# Patient Record
Sex: Female | Born: 1935 | Race: White | Hispanic: No | Marital: Married | Smoking: Never smoker
Health system: Southern US, Community
[De-identification: ages and names within clinical notes are randomized; demographics above are authoritative.]

## PROBLEM LIST (undated history)

## (undated) DIAGNOSIS — R413 Other amnesia: Principal | ICD-10-CM

## (undated) DIAGNOSIS — N814 Uterovaginal prolapse, unspecified: Secondary | ICD-10-CM

## (undated) DIAGNOSIS — R609 Edema, unspecified: Secondary | ICD-10-CM

## (undated) DIAGNOSIS — Z9181 History of falling: Secondary | ICD-10-CM

## (undated) DIAGNOSIS — K921 Melena: Secondary | ICD-10-CM

## (undated) DIAGNOSIS — E559 Vitamin D deficiency, unspecified: Secondary | ICD-10-CM

## (undated) DIAGNOSIS — K5900 Constipation, unspecified: Secondary | ICD-10-CM

## (undated) DIAGNOSIS — J301 Allergic rhinitis due to pollen: Secondary | ICD-10-CM

## (undated) DIAGNOSIS — R269 Unspecified abnormalities of gait and mobility: Secondary | ICD-10-CM

## (undated) DIAGNOSIS — B379 Candidiasis, unspecified: Secondary | ICD-10-CM

## (undated) DIAGNOSIS — R42 Dizziness and giddiness: Secondary | ICD-10-CM

## (undated) DIAGNOSIS — M1990 Unspecified osteoarthritis, unspecified site: Secondary | ICD-10-CM

## (undated) DIAGNOSIS — E785 Hyperlipidemia, unspecified: Secondary | ICD-10-CM

## (undated) DIAGNOSIS — K5732 Diverticulitis of large intestine without perforation or abscess without bleeding: Secondary | ICD-10-CM

## (undated) DIAGNOSIS — R928 Other abnormal and inconclusive findings on diagnostic imaging of breast: Secondary | ICD-10-CM

## (undated) DIAGNOSIS — M25529 Pain in unspecified elbow: Secondary | ICD-10-CM

## (undated) DIAGNOSIS — R209 Unspecified disturbances of skin sensation: Secondary | ICD-10-CM

## (undated) DIAGNOSIS — I10 Essential (primary) hypertension: Secondary | ICD-10-CM

## (undated) DIAGNOSIS — K8020 Calculus of gallbladder without cholecystitis without obstruction: Secondary | ICD-10-CM

## (undated) DIAGNOSIS — M545 Low back pain: Secondary | ICD-10-CM

## (undated) HISTORY — DX: History of falling: Z91.81

## (undated) HISTORY — DX: Hyperlipidemia, unspecified: E78.5

## (undated) HISTORY — DX: Melena: K92.1

## (undated) HISTORY — DX: Unspecified osteoarthritis, unspecified site: M19.90

## (undated) HISTORY — DX: Diverticulitis of large intestine without perforation or abscess without bleeding: K57.32

## (undated) HISTORY — DX: Other amnesia: R41.3

## (undated) HISTORY — DX: Other abnormal and inconclusive findings on diagnostic imaging of breast: R92.8

## (undated) HISTORY — DX: Uterovaginal prolapse, unspecified: N81.4

## (undated) HISTORY — DX: Candidiasis, unspecified: B37.9

## (undated) HISTORY — DX: Essential (primary) hypertension: I10

## (undated) HISTORY — DX: Vitamin D deficiency, unspecified: E55.9

## (undated) HISTORY — DX: Unspecified abnormalities of gait and mobility: R26.9

## (undated) HISTORY — DX: Pain in unspecified elbow: M25.529

## (undated) HISTORY — DX: Unspecified disturbances of skin sensation: R20.9

## (undated) HISTORY — DX: Edema, unspecified: R60.9

## (undated) HISTORY — DX: Constipation, unspecified: K59.00

## (undated) HISTORY — DX: Low back pain: M54.5

## (undated) HISTORY — DX: Dizziness and giddiness: R42

## (undated) HISTORY — DX: Calculus of gallbladder without cholecystitis without obstruction: K80.20

## (undated) HISTORY — DX: Allergic rhinitis due to pollen: J30.1

---

## 1997-04-17 HISTORY — PX: CATARACT EXTRACTION W/ INTRAOCULAR LENS  IMPLANT, BILATERAL: SHX1307

## 1997-08-24 ENCOUNTER — Encounter (HOSPITAL_COMMUNITY): Admission: RE | Admit: 1997-08-24 | Discharge: 1997-11-22 | Payer: Self-pay | Admitting: Internal Medicine

## 1999-05-03 ENCOUNTER — Encounter: Admission: RE | Admit: 1999-05-03 | Discharge: 1999-05-03 | Payer: Self-pay | Admitting: Internal Medicine

## 1999-05-03 ENCOUNTER — Encounter: Payer: Self-pay | Admitting: Internal Medicine

## 2000-05-03 ENCOUNTER — Encounter: Payer: Self-pay | Admitting: Internal Medicine

## 2000-05-03 ENCOUNTER — Encounter: Admission: RE | Admit: 2000-05-03 | Discharge: 2000-05-03 | Payer: Self-pay | Admitting: Internal Medicine

## 2001-05-22 ENCOUNTER — Encounter: Payer: Self-pay | Admitting: Internal Medicine

## 2001-05-22 ENCOUNTER — Encounter: Admission: RE | Admit: 2001-05-22 | Discharge: 2001-05-22 | Payer: Self-pay | Admitting: Internal Medicine

## 2002-07-28 ENCOUNTER — Encounter: Payer: Self-pay | Admitting: Internal Medicine

## 2002-07-28 ENCOUNTER — Encounter: Admission: RE | Admit: 2002-07-28 | Discharge: 2002-07-28 | Payer: Self-pay | Admitting: Internal Medicine

## 2003-02-24 ENCOUNTER — Encounter: Admission: RE | Admit: 2003-02-24 | Discharge: 2003-02-24 | Payer: Self-pay | Admitting: Internal Medicine

## 2003-10-28 ENCOUNTER — Inpatient Hospital Stay (HOSPITAL_COMMUNITY): Admission: EM | Admit: 2003-10-28 | Discharge: 2003-10-31 | Payer: Self-pay | Admitting: Podiatry

## 2003-10-28 DIAGNOSIS — K5732 Diverticulitis of large intestine without perforation or abscess without bleeding: Secondary | ICD-10-CM

## 2003-10-28 HISTORY — DX: Diverticulitis of large intestine without perforation or abscess without bleeding: K57.32

## 2003-11-11 ENCOUNTER — Ambulatory Visit (HOSPITAL_COMMUNITY): Admission: RE | Admit: 2003-11-11 | Discharge: 2003-11-11 | Payer: Self-pay | Admitting: Internal Medicine

## 2006-05-04 ENCOUNTER — Encounter: Admission: RE | Admit: 2006-05-04 | Discharge: 2006-05-04 | Payer: Self-pay | Admitting: Internal Medicine

## 2006-05-16 ENCOUNTER — Encounter: Admission: RE | Admit: 2006-05-16 | Discharge: 2006-05-16 | Payer: Self-pay | Admitting: Internal Medicine

## 2006-07-13 DIAGNOSIS — R42 Dizziness and giddiness: Secondary | ICD-10-CM

## 2006-07-13 DIAGNOSIS — J301 Allergic rhinitis due to pollen: Secondary | ICD-10-CM

## 2006-07-13 HISTORY — DX: Dizziness and giddiness: R42

## 2006-07-13 HISTORY — DX: Allergic rhinitis due to pollen: J30.1

## 2006-07-19 DIAGNOSIS — I10 Essential (primary) hypertension: Secondary | ICD-10-CM

## 2006-07-19 DIAGNOSIS — R609 Edema, unspecified: Secondary | ICD-10-CM

## 2006-07-19 DIAGNOSIS — M1990 Unspecified osteoarthritis, unspecified site: Secondary | ICD-10-CM

## 2006-07-19 HISTORY — DX: Edema, unspecified: R60.9

## 2006-07-19 HISTORY — DX: Unspecified osteoarthritis, unspecified site: M19.90

## 2006-07-19 HISTORY — DX: Essential (primary) hypertension: I10

## 2006-10-25 DIAGNOSIS — R928 Other abnormal and inconclusive findings on diagnostic imaging of breast: Secondary | ICD-10-CM

## 2006-10-25 DIAGNOSIS — M25529 Pain in unspecified elbow: Secondary | ICD-10-CM

## 2006-10-25 HISTORY — DX: Pain in unspecified elbow: M25.529

## 2006-10-25 HISTORY — DX: Other abnormal and inconclusive findings on diagnostic imaging of breast: R92.8

## 2006-12-24 ENCOUNTER — Encounter: Admission: RE | Admit: 2006-12-24 | Discharge: 2007-03-24 | Payer: Self-pay | Admitting: *Deleted

## 2007-03-28 DIAGNOSIS — E785 Hyperlipidemia, unspecified: Secondary | ICD-10-CM | POA: Insufficient documentation

## 2007-03-28 HISTORY — DX: Hyperlipidemia, unspecified: E78.5

## 2007-09-26 DIAGNOSIS — K5900 Constipation, unspecified: Secondary | ICD-10-CM

## 2007-09-26 HISTORY — DX: Constipation, unspecified: K59.00

## 2009-08-19 DIAGNOSIS — E559 Vitamin D deficiency, unspecified: Secondary | ICD-10-CM

## 2009-08-19 HISTORY — DX: Vitamin D deficiency, unspecified: E55.9

## 2009-11-25 DIAGNOSIS — R209 Unspecified disturbances of skin sensation: Secondary | ICD-10-CM

## 2009-11-25 HISTORY — DX: Unspecified disturbances of skin sensation: R20.9

## 2009-12-22 ENCOUNTER — Encounter: Admission: RE | Admit: 2009-12-22 | Discharge: 2009-12-22 | Payer: Self-pay | Admitting: Internal Medicine

## 2009-12-22 DIAGNOSIS — Z9181 History of falling: Secondary | ICD-10-CM

## 2009-12-22 DIAGNOSIS — M545 Low back pain, unspecified: Secondary | ICD-10-CM

## 2009-12-22 HISTORY — DX: History of falling: Z91.81

## 2009-12-22 HISTORY — DX: Low back pain, unspecified: M54.50

## 2010-02-17 DIAGNOSIS — R413 Other amnesia: Secondary | ICD-10-CM

## 2010-02-17 HISTORY — DX: Other amnesia: R41.3

## 2010-05-08 ENCOUNTER — Encounter: Payer: Self-pay | Admitting: Internal Medicine

## 2010-05-08 ENCOUNTER — Encounter: Payer: Self-pay | Admitting: *Deleted

## 2010-08-02 DIAGNOSIS — K8020 Calculus of gallbladder without cholecystitis without obstruction: Secondary | ICD-10-CM

## 2010-08-02 HISTORY — DX: Calculus of gallbladder without cholecystitis without obstruction: K80.20

## 2010-08-05 ENCOUNTER — Emergency Department (HOSPITAL_COMMUNITY): Payer: Medicare Other

## 2010-08-05 ENCOUNTER — Inpatient Hospital Stay (HOSPITAL_COMMUNITY)
Admission: EM | Admit: 2010-08-05 | Discharge: 2010-08-07 | DRG: 442 | Disposition: A | Discharge status: Home / Self Care | Payer: Medicare Other | Attending: Internal Medicine | Admitting: Internal Medicine

## 2010-08-05 DIAGNOSIS — K8020 Calculus of gallbladder without cholecystitis without obstruction: Secondary | ICD-10-CM | POA: Diagnosis present

## 2010-08-05 DIAGNOSIS — N390 Urinary tract infection, site not specified: Secondary | ICD-10-CM | POA: Diagnosis present

## 2010-08-05 DIAGNOSIS — Z66 Do not resuscitate: Secondary | ICD-10-CM | POA: Diagnosis present

## 2010-08-05 DIAGNOSIS — F068 Other specified mental disorders due to known physiological condition: Secondary | ICD-10-CM | POA: Diagnosis present

## 2010-08-05 DIAGNOSIS — M1990 Unspecified osteoarthritis, unspecified site: Secondary | ICD-10-CM | POA: Diagnosis present

## 2010-08-05 DIAGNOSIS — K5900 Constipation, unspecified: Secondary | ICD-10-CM | POA: Diagnosis present

## 2010-08-05 DIAGNOSIS — K759 Inflammatory liver disease, unspecified: Principal | ICD-10-CM | POA: Diagnosis present

## 2010-08-05 LAB — COMPREHENSIVE METABOLIC PANEL
Albumin: 3.8 g/dL (ref 3.5–5.2)
Alkaline Phosphatase: 88 U/L (ref 39–117)
BUN: 22 mg/dL (ref 6–23)
CO2: 30 mEq/L (ref 19–32)
Chloride: 102 mEq/L (ref 96–112)
GFR calc Af Amer: 54 mL/min — ABNORMAL LOW (ref >60)
Potassium: 4 mEq/L (ref 3.5–5.1)
Sodium: 139 mEq/L (ref 135–145)
Total Protein: 6.6 g/dL (ref 6.0–8.3)

## 2010-08-05 LAB — URINALYSIS, ROUTINE W REFLEX MICROSCOPIC
Hgb urine dipstick: NEGATIVE
Ketones, ur: 15 mg/dL — AB
Nitrite: NEGATIVE
Protein, ur: 30 mg/dL — AB
pH: 7.5 (ref 5.0–8.0)

## 2010-08-05 LAB — POCT CARDIAC MARKERS
CKMB, poc: <1 ng/mL — ABNORMAL LOW (ref 1.0–8.0)
Myoglobin, poc: 77.4 ng/mL (ref 12–200)
Troponin i, poc: <0.05 ng/mL (ref 0.00–0.09)

## 2010-08-05 LAB — CBC
HCT: 42.1 % (ref 36.0–46.0)
Hemoglobin: 14.3 g/dL (ref 12.0–15.0)
MCH: 31.9 pg (ref 26.0–34.0)
MCHC: 34 g/dL (ref 30.0–36.0)
MCV: 94 fL (ref 78.0–100.0)
RBC: 4.48 MIL/uL (ref 3.87–5.11)
WBC: 12.7 10*3/uL — ABNORMAL HIGH (ref 4.0–10.5)

## 2010-08-05 LAB — DIFFERENTIAL
Eosinophils Absolute: 0 10*3/uL (ref 0.0–0.7)
Monocytes Relative: 3 % (ref 3–12)
Neutro Abs: 11.7 10*3/uL — ABNORMAL HIGH (ref 1.7–7.7)
Neutrophils Relative %: 93 % — ABNORMAL HIGH (ref 43–77)

## 2010-08-05 LAB — URINE MICROSCOPIC-ADD ON

## 2010-08-06 ENCOUNTER — Inpatient Hospital Stay (HOSPITAL_COMMUNITY): Payer: Medicare Other

## 2010-08-06 DIAGNOSIS — R1013 Epigastric pain: Secondary | ICD-10-CM

## 2010-08-06 DIAGNOSIS — R932 Abnormal findings on diagnostic imaging of liver and biliary tract: Secondary | ICD-10-CM

## 2010-08-06 DIAGNOSIS — R740 Nonspecific elevation of levels of transaminase and lactic acid dehydrogenase [LDH]: Secondary | ICD-10-CM

## 2010-08-06 LAB — HEPATIC FUNCTION PANEL
AST: 420 U/L — ABNORMAL HIGH (ref 0–37)
Alkaline Phosphatase: 154 U/L — ABNORMAL HIGH (ref 39–117)
Indirect Bilirubin: 1.8 mg/dL — ABNORMAL HIGH (ref 0.3–0.9)

## 2010-08-06 LAB — COMPREHENSIVE METABOLIC PANEL
Albumin: 3.4 g/dL — ABNORMAL LOW (ref 3.5–5.2)
Calcium: 9.3 mg/dL (ref 8.4–10.5)
Chloride: 101 mEq/L (ref 96–112)
Creatinine, Ser: 1.07 mg/dL (ref 0.4–1.2)
GFR calc non Af Amer: 48 mL/min — ABNORMAL LOW (ref >60)
Glucose, Bld: 136 mg/dL — ABNORMAL HIGH (ref 70–99)
Potassium: 4.1 mEq/L (ref 3.5–5.1)

## 2010-08-06 LAB — CBC
HCT: 40.9 % (ref 36.0–46.0)
Hemoglobin: 13.7 g/dL (ref 12.0–15.0)
MCHC: 33.5 g/dL (ref 30.0–36.0)
Platelets: 164 10*3/uL (ref 150–400)

## 2010-08-06 LAB — URINE CULTURE: Culture  Setup Time: 201204201918

## 2010-08-06 LAB — MRSA PCR SCREENING: MRSA by PCR: NEGATIVE

## 2010-08-06 MED ORDER — TECHNETIUM TC 99M MEBROFENIN IV KIT
5.0000 | PACK | Freq: Once | INTRAVENOUS | Status: AC | PRN
  Administered 2010-08-06: 5 via INTRAVENOUS

## 2010-08-06 MED ORDER — GADOXETATE DISODIUM 0.25 MMOL/ML IV SOLN
7.0000 mL | Freq: Once | INTRAVENOUS | Status: AC | PRN
  Administered 2010-08-06: 7 mL via INTRAVENOUS

## 2010-08-06 NOTE — H&P (Signed)
[name], [name]                ACCOUNT NO.:  [id number]  MEDICAL RECORD NO.:  [id number]           PATIENT TYPE:  I  LOCATION:  5149                         FACILITY:  [facility]  PHYSICIAN:  Lonia Blood, M.D.       DATE OF BIRTH:  1918-09-04  DATE OF ADMISSION:  08/05/2010 DATE OF DISCHARGE:                             HISTORY & PHYSICAL   PRIMARY CARE PHYSICIAN:  Lenon Curt. Chilton Si, MD  CHIEF COMPLAINT:  Epigastric pain.  HISTORY OF PRESENT ILLNESS:  [name] is an 77 year old woman who was brought today to the emergency room after she experienced episodes of epigastric discomfort that lasted for about an hour at the assisted living.  The episode was not associated with any nausea or vomiting. She said the pain was 10/10, made her quite uncomfortable and she was transferred with ambulance to the emergency room. She carries a history of acute diverticulitis and she had an admission in July 2005 when it was noted she was having cholelithiasis and acute diverticulitis.  At that time, it was also noted that she was having elevated transaminases. The patient though since 2005 has been in excellent health, living at [facility] in the independent area.  PAST MEDICAL HISTORY:  Mainly osteoarthritis, cholelithiasis.  HOME MEDICATIONS:  Tylenol as needed.  ALLERGIES:  No drug allergies.  SOCIAL HISTORY:  The patient has 2 children, a son and daughter.  She is a widower for the past 7 years.  Does not smoke, does not drink.  Used to work in an office.  REVIEW OF SYSTEMS:  She is status post cataract surgery, still has difficulty with vision and needs to wear glasses.  Denies constipation. Denies chest pain.  Denies shortness of breath.  Otherwise per HPI were negative.  PHYSICAL EXAMINATION UPON ADMISSION:  VITAL SIGNS:  Temperature is 97.6, blood pressure 137/60, pulse 74, respirations 18, saturation 90% on room air. GENERAL:  The patient is alert, oriented, in no acute  distress. HEAD:  Normocephalic, atraumatic. EYES:  Pupils are equal, round, reactive to light and accomodation. Extraocular movements are intact. THROAT:  Clear. NECK:  Supple, no JVD. CHEST:  Clear to auscultation.  No wheezing or crackles. HEART:  Has a 2/6 systolic murmur at the apex.  No rub, no gallops. ABDOMEN:  Distended, soft, positive bowel sounds.  No tenderness.  No rebound or guarding. LOWER EXTREMITIES:  Without edema. SKIN:  Warm, dry.  No suspicious looking rashes. NEUROLOGIC:  Cranial nerves II through XII intact.  Strength 5/5 in 4 extremities.  Sensation intact.  LABORATORY VALUES ON ADMISSION:  White blood cell count 12,000, hemoglobin 14.3, platelet count 165,000.  Sodium 139, potassium 4, chloride 102, bicarbonate 30, BUN 22, creatinine 1.1.  Urinalysis positive for leukocyte esterase, many bacteria, oxalate crystals.  Chest x-ray PA and lateral, no acute findings.  Abdominal ultrasound shows cholelithiasis and some mild gallbladder wall thickening.  The patient's total bilirubin is 1.6, AST 345, ALT 159.  When compared to results from July 2005, at that time the patient had an AST of 52, ALT of 157, bilirubin 1.0, positive ANA in  the range of 1 to 640.  Speckled pattern was observed in July 2005.  ASSESSMENT/PLAN: 1. This is a 77 year old woman presenting with one episode of     epigastric discomfort.  She has resolved her symptoms by now.  The     constellation of findings of cholelithiasis with some mild     gallbladder wall thickening and elevated transaminases raise the     suspicion for chronic cholecystitis.  I doubt that she has acute     cholecystitis as she has Murphy sign negative, she is not febrile,     or acutely ill.  Nevertheless, given the fact that the patient was     quite severe, the patient should be evaluated with a HIDA scan     acutely.  I am also going to follow up on her LFTs tomorrow.  She     is not taking any alcohol and she is not  using any large quantities     of Tylenol, so I think this elevated LFTs could be explained by the     cholelithiasis.  Reviewing the records from the past, the patient     has had a HIDA scan back in July 2005 and it was noted that the     left lobe of the liver was prominent suggesting focal cirrhosis but     without any definitive diagnosis being established. 2. Urinary tract infection.  Culture will be sent.  The patient will     be started on Rocephin. 3. Part of the differential for the patient's abdominal pain symptoms     is also constipation, so we are going to obtain an abdominal x-ray     to see if that is present or not.  Per history, the patient has     good bowel movement every day. 4. I have personally addressed the code status and the patient and her     family told me that she is do not res[facility]ate [facility], so     we will continue the status.     Lonia Blood, M.D.     SL/MEDQ  D:  08/05/2010  T:  08/06/2010  Job:  [id number]  cc:   Lenon Curt Chilton Si, M.D.  Electronically Signed by Lonia Blood M.D. on 08/06/2010 03:47:10 PM

## 2010-08-07 DIAGNOSIS — R1013 Epigastric pain: Secondary | ICD-10-CM

## 2010-08-07 DIAGNOSIS — R740 Nonspecific elevation of levels of transaminase and lactic acid dehydrogenase [LDH]: Secondary | ICD-10-CM

## 2010-08-07 LAB — CBC
HCT: 41.4 % (ref 36.0–46.0)
MCV: 91.8 fL (ref 78.0–100.0)
RBC: 4.51 MIL/uL (ref 3.87–5.11)
RDW: 12.8 % (ref 11.5–15.5)
WBC: 6.1 10*3/uL (ref 4.0–10.5)

## 2010-08-07 LAB — COMPREHENSIVE METABOLIC PANEL
Alkaline Phosphatase: 176 U/L — ABNORMAL HIGH (ref 39–117)
BUN: 10 mg/dL (ref 6–23)
Chloride: 104 mEq/L (ref 96–112)
Glucose, Bld: 108 mg/dL — ABNORMAL HIGH (ref 70–99)
Potassium: 3.5 mEq/L (ref 3.5–5.1)
Total Bilirubin: 2.6 mg/dL — ABNORMAL HIGH (ref 0.3–1.2)

## 2010-08-16 NOTE — Consult Note (Signed)
[name], [name]                ACCOUNT NO.:  [id number]  MEDICAL RECORD NO.:  [id number]           PATIENT TYPE:  I  LOCATION:  5149                         FACILITY:  [facility]  PHYSICIAN:  Iva Boop, MD,FACGDATE OF BIRTH:  1918-06-18  DATE OF CONSULTATION: DATE OF DISCHARGE:                                CONSULTATION   REQUESTING PHYSICIAN:  Calvert Cantor, MD, Hospitalist.  PRIMARY CARE PHYSICIAN:  Lenon Curt. Chilton Si, MD  SURGEON:  Cherylynn Ridges, MD  REASON FOR CONSULTATION:  Abdominal pain, abnormal HIDA and ultrasound, question gallbladder problems.  ASSESSMENT:  An 77 year old white woman admitted after epigastric pain problems yesterday.  Imaging has shown mild gallbladder wall thickening in the gallbladder with gallstone, and a normal common bile duct.  She had a HIDA scan which demonstrated no filling of the gallbladder, bile duct, or intestine.  She has rising liver function tests at this point. Upon admission yesterday, bilirubin 1.6, alk phos 88, AST 345, ALT 159. Today, now bilirubin 4, alk phos 154, AST 420, ALT 442.  It is not clear to me what is going on here.  She had some mild epigastric pain after fish meal from [facility].  She is perfectly fine and nontender at this time.  History limited slightly by her short-term memory disturbance.  She does not have any gallbladder or bile duct activity on the HIDA scan and her bile duct is normal on the ultrasound. Though she could have bile duct obstruction, I am puzzled as to why we do not see any bile duct activity on the HIDA scan and the overall picture is just different enough to indicate a need for more information.  Dr. Lindie Spruce and I have discussed this, he thinks that a cholecystectomy is appropriate at some point and that certainly is likely, he thinks an preoperative ERCP would be useful.  I think that may be the case, but prior to that, I think an MRI of the liver and an MRCP are prudent to understand  this better with noninvasive minimal risk of imaging.  I have explained this to the patient and her son and daughter-in-law.  HISTORY:  Delightful 32 year old white woman who resides in Independent Living at [facility].  She is having some worsening short-term memory disturbance over time.  She ate at [facility] yesterday and later had epigastric pain.  She cannot really characterize that to well.  There was some mild nausea, no vomiting, I do not think there was any back pain.  She was sent to the emergency department where laboratory investigation showed the findings as above.  She rapidly improved and she was only in pain for an hour or so if that.  She has been admitted to the floor, she had been placed on IV ceftriaxone, have the imaging studies as described.  There was no chest pain or respiratory distress infections, no pain now on at all.  Denies any bowel habit changes or dysphagia or chronic GI problems and she takes no medications other than some Tylenol at home.  DRUG ALLERGIES:  None known.  CURRENT MEDICATIONS:  Enoxaparin prophylaxis,  ceftriaxone 1 g IV q.24 h.  PAST MEDICAL HISTORY:  Osteoarthritis and she has had known gallstones going back on ultrasound studies over the years.  Apparently, she has had some pain because she has had at least a couple of ultrasounds and even a HIDA scan in the past that was actually okay.  SOCIAL HISTORY:  She has five children altogether, one of her sons is here now.  She is widowed for 7 years.  No alcohol or tobacco.  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  PAST SURGICAL HISTORY:  Cataract surgery.  REVIEW OF SYSTEMS:  Wears eyeglasses.  Some joint pain.  All other systems are negative other than that mentioned in the HPI.  PHYSICAL EXAMINATION:  GENERAL:  Reveals a pleasant well-developed robust 77 year old white woman. VITAL SIGNS:  Temperature 98.5, pulse 68, respirations 16,  blood pressure 130/76. HEENT:  Her eyes are anicteric.  The mouth shows dentures, but otherwise free of oral lesions.  Posterior pharynx clear. NECK:  Supple.  No mass. CHEST:  Clear. HEART:  S1-S2.  I hear no rubs, murmurs, or gallops.  There is no jugular venous distention. ABDOMEN:  Soft and nontender.  It is mildly obese.  There is no organomegaly or mass. EXTREMITIES:  The lower extremities are free of edema. NEUROLOGIC:  She is grossly nonfocal on neuro exam.  She is following commands well.  Her short-term memory is not intact.  She intermittently think she is in the hospital versus [facility].  LABORATORY DATA:  As mentioned above.  White count 6.9, hemoglobin 13.7, hematocrit 40, platelet 164.  Cardiac markers are negative.  UA has a small amount of bilirubin, 21-50 white cells, large leukocytes, 15 ketones.  I have viewed the hepatobiliary scan images.  I appreciate the opportunity to care for this patient.     Iva Boop, MD,FACG     CEG/MEDQ  D:  08/06/2010  T:  08/07/2010  Job:  [id number]  cc:   Lenon Curt. Chilton Si, M.D. Cherylynn Ridges, M.D.  Electronically Signed by Stan Head MDFACG on 08/16/2010 01:11:27 PM

## 2010-08-16 NOTE — Consult Note (Signed)
  [name], [name]                ACCOUNT NO.:  [id number]  MEDICAL RECORD NO.:  [id number]           PATIENT TYPE:  LOCATION:                                 FACILITY:  PHYSICIAN:  Iva Boop, MD,FACGDATE OF BIRTH:  Sep 06, 1918  DATE OF CONSULTATION: DATE OF DISCHARGE:                                CONSULTATION   ADDENDUM  ADDITIONAL INFORMATION:  Additional records are reviewed showing that she has either sensitivities or allergies to SULFA, LOPID, and ZOCOR in the past and that she has a history of diverticulitis with partial small bowel obstruction, Zetia-induced LFT abnormalities or hepatitis, dyslipidemia, polymyalgia rheumatica, GERD, and hypertension.     Iva Boop, MD,FACG     CEG/MEDQ  D:  08/06/2010  T:  08/07/2010  Job:  [id number]  cc:   Dr. Lindie Spruce Dr. Chilton Si  Electronically Signed by Stan Head MDFACG on 08/16/2010 01:11:53 PM

## 2010-08-22 NOTE — Consult Note (Signed)
  [name], [name]                ACCOUNT NO.:  [id number]  MEDICAL RECORD NO.:  [id number]           PATIENT TYPE:  I  LOCATION:  5149                         FACILITY:  [facility]  PHYSICIAN:  Cherylynn Ridges, M.D.    DATE OF BIRTH:  1918/06/17  DATE OF CONSULTATION:  08/06/2010 DATE OF DISCHARGE:                                CONSULTATION   CHIEF COMPLAINT:  The patient is an 77 year old female with symptomatic gallstones and abnormal liver function tests.  HISTORY OF PRESENT ILLNESS:  The patient first had her episode of pain yesterday early in a day and then again after she had eaten some salmon and baked potato at [facility].  She had epigastric pain that radiated across her upper abdomen, was not associated with any nausea, vomiting, and subsequently went away with minimal problems but she did come in to be evaluated yesterday and was found to have gallstones on an ultrasound.  A HIDA scan today demonstrated nonfunctioning gallbladder.However, it turns out that her liver function tests have increased.  Her AST and ALT have increased up into 400 and her bilirubin has gone from 1.7 up to 4.0.  GI, Dr. Leone Payor has been consulted.  PAST MEDICAL HISTORY:  Very unremarkable.  She has some mild arthritis in her shoulder and she takes Tylenol for that.  MEDICATIONS:  She takes no medications outside of vitamin D prep and calcium.  ALLERGIES:  She has no known drug allergies.  PAST SURGICAL HISTORY:  She has had no previous surgery.  REVIEW OF SYSTEMS:  Unremarkable.  She has had no GI procedures performed.  PHYSICAL EXAMINATION:  VITAL SIGNS:  She is afebrile at 98.5, blood pressure 130/76, and pulse of 68. GENERAL:  She is normocephalic and atraumatic.  I do not know of any scleral icterus. NECK:  Supple.  No bruits. LUNGS:  Clear to auscultation. CARDIAC:  Regular rhythm and rate with no murmurs. ABDOMEN:  Soft and nontender.  No palpable masses.  No marks. RECTAL/PELVIC:   Not performed.  LABORATORY STUDIES:  Her white blood cell count is normal at 6.9 and hemoglobin 13.7.  LFTs have gone up.  Her total bili is 4 now and SGOT is 420 and PT is also abnormal.  Her AST and ALT have slightly improved but her bili has gone up and therefore she may benefit from a preoperative ERCP.  IMPRESSION AND PLAN:  Dr. Leone Payor is here to see her and he will determine when that study can be performed.  We will subsequently go ahead and do a cholecystectomy.     Cherylynn Ridges, M.D.     JOW/MEDQ  D:  08/06/2010  T:  08/07/2010  Job:  [id number]  cc:   Iva Boop, MD,FACG Dr. Gaynell Face  Electronically Signed by Jimmye Norman M.D. on 08/22/2010 04:10:23 PM

## 2010-08-24 NOTE — Discharge Summary (Signed)
NAMESINCLAIRE, ARTIGA                ACCOUNT NO.:  [id number]  MEDICAL RECORD NO.:  [id number]           PATIENT TYPE:  I  LOCATION:  5149                         FACILITY:  [facility]  PHYSICIAN:  Calvert Cantor, M.D.     DATE OF BIRTH:  Mar 01, 1919  DATE OF ADMISSION:  08/05/2010 DATE OF DISCHARGE:  08/07/2010                              DISCHARGE SUMMARY   PRIMARY CARE PHYSICIAN:  Lenon Curt. Chilton Si, MD  PRESENTING COMPLAINT:  Epigastric pain.  DISCHARGE DIAGNOSES: 1. Hepatitis of uncertain etiology.  The patient is to follow up with     Dr. Leone Payor as an outpatient for further workup. 2. Cholelithiasis. 3. Osteoarthritis.  DISCHARGE MEDICATIONS:  No change in medications have been made.  The patient can continue on the following discharge medications. 1. Acetaminophen 325 mg every 6 hours as needed. 2. Calcium carbonate 3 tablets daily. 3. Omega-3 ethyl esters 1 capsule daily. 4. Vicks VapoRub one application as needed at bedtime. CONSULTANTS: 1. Marta Lamas. Lindie Spruce, MD with Surgery. 2. Iva Boop, MD with GI.  HOSPITAL COURSE:  This is an 77 year old female, who presented to the ER with epigastric pain.  She is noted to have elevated LFTs and therefore further workup was performed.  An ultrasound of the abdomen was performed on the day of admission, which revealed mild gallbladder wall thickening measuring 5 mm, mobile gallstones in the fundus.  Murphy sign was negative.  Hepatobiliary scan was recommended.  HIDA scan performed on the following day was read as follows.  Absence of biliary, gallbladder, and bowel activity in 3 hours.  Hepatomegaly is also noted.  The differential included CBD obstruction versus cholestatic hepatitis.  At this point, GI consult was requested.  A Surgery consult was also requested.  She was evaluated by Dr. Leone Payor, who ordered an MRCP.  MRCP impression, cholelithiasis without evidence of cholecystitis, biliary dilatation, or  choledocholithiasis.  Of note, the cystic duct has a very low insertion on the common bile duct.  No evidence of pancreatitis or pancreas divisum.  No acute abdominal findings identified.  At this point, Dr. Marvell Fuller suspicion is the patient may have a hepatitis, possibly autoimmune.  For now, she does not need a cholecystectomy.  She is going to be discharged home and can follow up with Dr. Leone Payor for further workup.  Of note, the patient does have some dementia with problems with short- term memory.  She developed severe sundowning on the evening of August 06, 2010, and required a Comptroller.  Family is quite anxious to take her back home to her normal settings.  PHYSICAL EXAM:  ABDOMEN:  Soft, nontender, nondistended.  Bowel sounds positive.  No organomegaly. HEART:  Regular rate and rhythm.  No murmurs. LUNGS:  Clear bilaterally.  CONDITION ON DISCHARGE:  Stable.  FOLLOWUP:  Dr. Leone Payor will further contact the patient's PCP for old records.  He will then be contacting the patient/family for an outpatient appointment.  Time on discharge was 45 minutes.     Calvert Cantor, M.D.     SR/MEDQ  D:  08/07/2010  T:  08/07/2010  Job:  [id number]  cc:   Lenon Curt. Chilton Si, M.D.  Electronically Signed by Calvert Cantor M.D. on 08/24/2010 11:03:13 PM

## 2010-09-02 NOTE — Discharge Summary (Signed)
NAMEANDERSEN, MCKIVER NO.:  [id number]   MEDICAL RECORD NO.:  [id number]                   PATIENT TYPE:  INP   LOCATION:  0365                                 [facility]ACILITY[facility]  [facility]   PHYSICIAN:  Luis A. Delaney Meigs, M.D.                DATE OF BIRTH:  01/04/1919   DATE OF ADMISSION:  10/28/2003  DATE OF DISCHARGE:                                 DISCHARGE SUMMARY   ADDENDUM:  An extensive discussion was had with patient prior to discharge,  reiterating issues related to diet, use of antimicrobials, present  condition, followup, etc.  The patient's son and daughter had many  questions, and these were all answered and concerns addressed.  Nursing note  dated October 30, 2003 at 5 p.m. reads, [facility] called to report  Enterococcus in urine; however, the sample tested was in question.  The lab  is to be repeated, rerun, and results will be available in a.m., July 16th.   Patient was eager to be discharged and was requested to follow up with a  urine culture with her primary care physician, Dr. Lendell Caprice, within a week,  for which she already has an appointment scheduled.                                               Luis A. Delaney Meigs, M.D.    LAT/MEDQ  D:  10/31/2003  T:  10/31/2003  Job:  [id number]

## 2010-09-02 NOTE — H&P (Signed)
NAME:  [name], [name] NO.:  [id number]   MEDICAL RECORD NO.:  [id number]                   PATIENT TYPE:  EMS   LOCATION:  ED                                   FACILITY:  [facility]   PHYSICIAN:  Jonna L. Robb Matar, M.D.            DATE OF BIRTH:  09-Nov-1918   DATE OF ADMISSION:  10/28/2003  DATE OF DISCHARGE:                                HISTORY & PHYSICAL   PRIMARY CARE PHYSICIAN:  Janae Bridgeman. Lendell Caprice, M.D.   CHIEF COMPLAINT:  Stomach pain.   HISTORY:  This 77 year old white female developed postprandial abdominal  pain last night, sort of diffusely.  She threw up three times of some food  that she had eaten, she was kind of weak.  It did not get any better with  Maalox.  Her back was hurting.  By this morning she called her son at 6:30  in the morning and states she just was not feeling good.  In the emergency  room she received some fluids and her color is better and she has not thrown  up today.  She had no exposure to food poisoning.   PAST MEDICAL HISTORY:  1. Cholelithiasis.  2. Hypercholesterolemia.  She had been on a previous medication, but it was     stopped because she had elevated liver function tests and she was     switched to Zetia, but she has just been trying to watch her diet and try     to stay off the medication.  3. Osteoarthritis.  4. GERD.   OPERATIONS:  Bilateral cataract surgery.   FAMILY HISTORY:  Diabetes.   SOCIAL HISTORY:  Her husband died three weeks ago secondary to cancer.  She  has five children.  She is a nonsmoker, nondrinker, retired Film/video editor.   ALLERGIES:  None.   MEDICATIONS:  Aciphex and Zetia.   REVIEW OF SYSTEMS:  The patient has had a 55-pound weight loss this year,  which was planned.  No visual problems since the cataracts were taken out.  No ENT, cough, wheezing, chest pain.  No heart disease or blood clots.  No  hematemesis or melena.  Occasional constipation.  No dysuria or frequency or  hematuria.  No breast problems.  She has not been told of diabetes.  No  thyroid disease.  No unusual bleeding or bruising.  No skin problems.  She  has arthritis, especially in her back and her hands.  No history of stroke,  seizure.  She used to get bad headaches when she was younger but has not had  them in years.  No psychiatric problems.   PHYSICAL EXAMINATION:  VITAL SIGNS:  Temperature 97.4, pulse 94, 24, 109/65,  weight 139.  GENERAL:  This is an older white female lying in bed.  HEENT:  Conjunctivae and lids are normal.  Pupils are equal, and reactive to  light, EOMs are full.  She has hearing, mucosa, and pharynx.  NECK:  Supple without masses, thyromegaly, or carotid bruits.  CHEST:  The respiratory effort is normal.  The lungs are clear to A&P  without wheezing, rales, rhonchi, or dullness.  CARDIAC:  Regular rate and rhythm with normal S1 and S2 without murmurs,  rubs, or gallops.  There are no bruits, cyanosis, clubbing, or edema.  BREASTS:  No masses, tenderness, or discharge.  ABDOMEN:  The abdomen has bowel sounds.  There is no hepatosplenomegaly or  hernias.  There is some epigastric tenderness.  There is tenderness on  either side of the umbilicus over the renal area, and the most tender area  actually seems to be the left lower quadrant.  RECTAL:  Stool is negative for blood.  LYMPHATIC:  There is no adenopathy.  MUSCULOSKELETAL:  Muscle strength is 5/5 all four extremities with full  range of motion.  She has osteoarthritic changes.  SKIN:  No rashes, lesions, induration.  Subcutaneous cysts.  NEUROLOGIC:  Cranial nerves are intact.  EOMs are full.  DTRs are 2+ in the  knees.  Sensation is normal.  She is alert and oriented x3.  Normal memory,  judgment, and affect.   LABORATORY DATA:  Ultrasound shows gallstones but no inflammation or biliary  dilatation.  KUB shows partial small bowel obstruction.  White count is  17.8.  BUN 13, creatinine 1.1, glucose 197.  Her  liver function tests are  elevated with an AST of 467, ALT of 432, bilirubin of 2.5, and alkaline  phosphatase of 156.  Urinalysis is pending.   IMPRESSION:  1. Partial small bowel obstruction and infection, probably due to a     diverticulitis, although pyelonephritis would also be a consideration.  I     am going to put her on some Unasyn pending cultures, keep her just on     clear liquids, and follow her abdomen along.  2. Hepatitis, possibly drug-induced.  She sounds like she was switched off     of statins, but Zetia can also give a drug-induced hepatitis.  I am going     to discontinue that.  There was not any specific obstructive pathology     seen on the ultrasound.  If her obstruction persists, I will get a CT of     her abdomen and perhaps a surgical consultation.  3. Hypercholesterolemia.  4. Hypertension.  I am going to hold on any medications; in fact, she was     probably slightly dry from her vomiting.  5. Hyperglycemia.  I am going to pattern her sugars and cover her.                                               Jonna L. Robb Matar, M.D.    Dorna Bloom  D:  10/28/2003  T:  10/28/2003  Job:  [id number]   cc:   Janae Bridgeman. Eloise Harman., M.D.  [address]  [place]  [place] [address]  Fax: [phone]

## 2010-09-16 ENCOUNTER — Telehealth: Payer: Self-pay

## 2010-09-16 NOTE — Telephone Encounter (Signed)
I spoke with the patient and her son.  Her liver tests drawn at [facility] were normal on 08/25/10.  She denies abdominal pain or "other problems that a doctor needs to know about".  Her son reports that she is doing much better eating well and now in assisted living.  I will contact [facility] for the last lab work and have them fax them to [place].  Herr sons states she is doing 100 % better.

## 2010-09-16 NOTE — Telephone Encounter (Signed)
Message copied by Annett Fabian on Fri Sep 16, 2010  3:46 PM ------      Message from: Stan Head E      Created: Thu Sep 15, 2010  6:17 PM       Please contact her son - let him know it has been a while but I did look at her records from dr. Chilton Si and that her LFT's were normal in 07/2009            She may need some follow-up for the abnormal ones in hiospital            1) How is she ? Any abd pain      2) if no LFT's since hospital she should have them checked re: 790.4

## 2010-09-19 NOTE — Telephone Encounter (Signed)
Ok thanks Sounds like she will need no further intervention at this time.

## 2011-03-02 DIAGNOSIS — R269 Unspecified abnormalities of gait and mobility: Secondary | ICD-10-CM

## 2011-03-02 HISTORY — DX: Unspecified abnormalities of gait and mobility: R26.9

## 2011-06-29 DIAGNOSIS — N814 Uterovaginal prolapse, unspecified: Secondary | ICD-10-CM | POA: Insufficient documentation

## 2011-06-29 DIAGNOSIS — K921 Melena: Secondary | ICD-10-CM

## 2011-06-29 HISTORY — DX: Uterovaginal prolapse, unspecified: N81.4

## 2011-06-29 HISTORY — DX: Melena: K92.1

## 2012-06-11 IMAGING — US US ABDOMEN COMPLETE
1 series · 14 of 25 positions shown · non-contrast
Comparison: None.

CLINICAL DATA: Epigastric pain, elevated LFTs, question CBD
obstruction versus gallbladder disease

COMPLETE ABDOMINAL ULTRASOUND

[Series 1: us abdomen complete · 0.25mm/px · 14 of 108 slices shown]
[im 1/108]
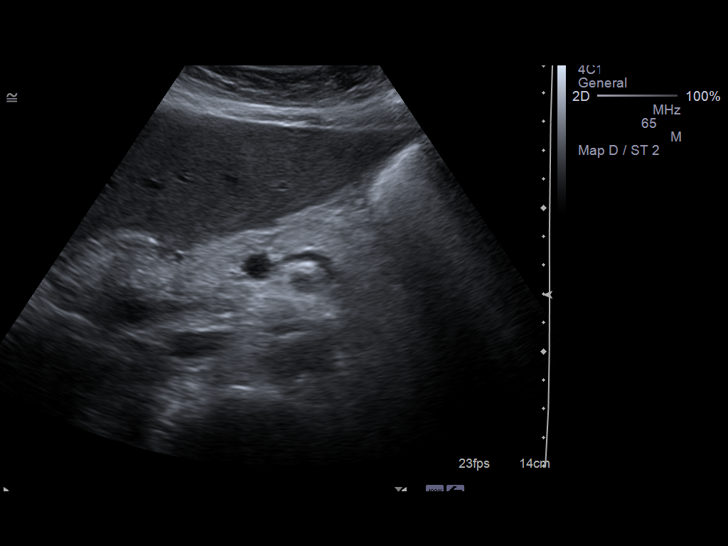
[im 9/108]
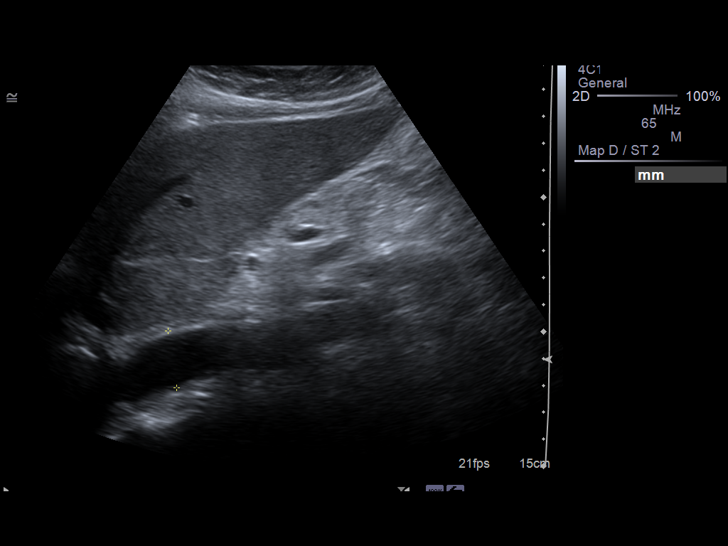
[im 18/108]
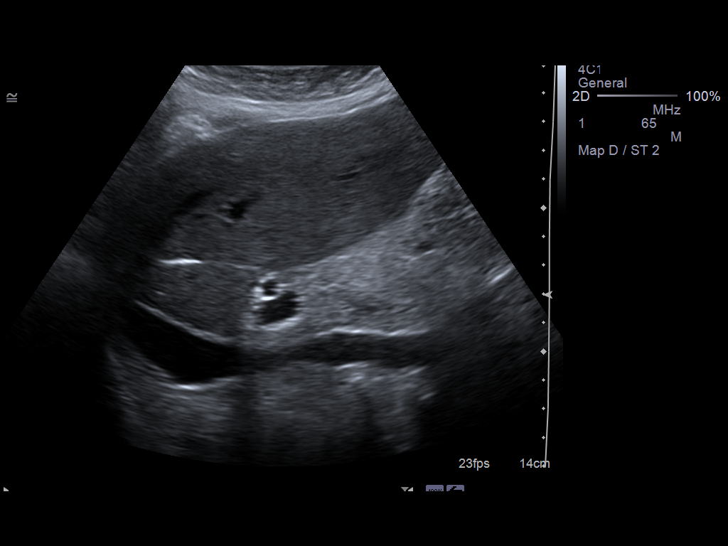
[im 27/108]
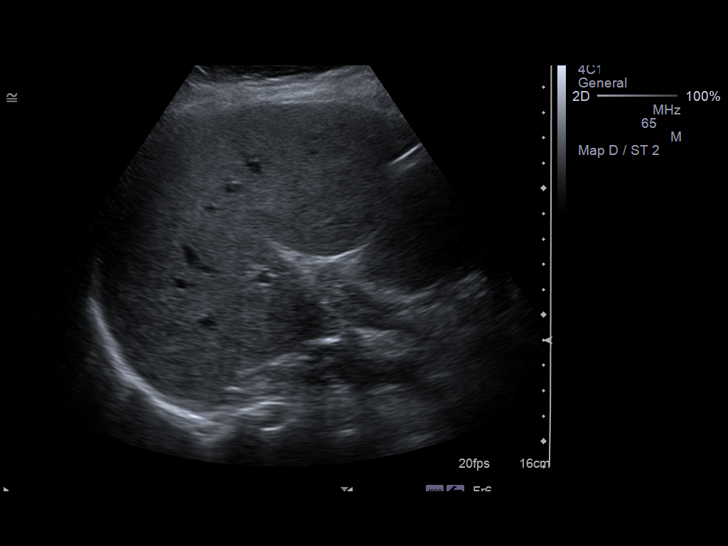
[im 36/108]
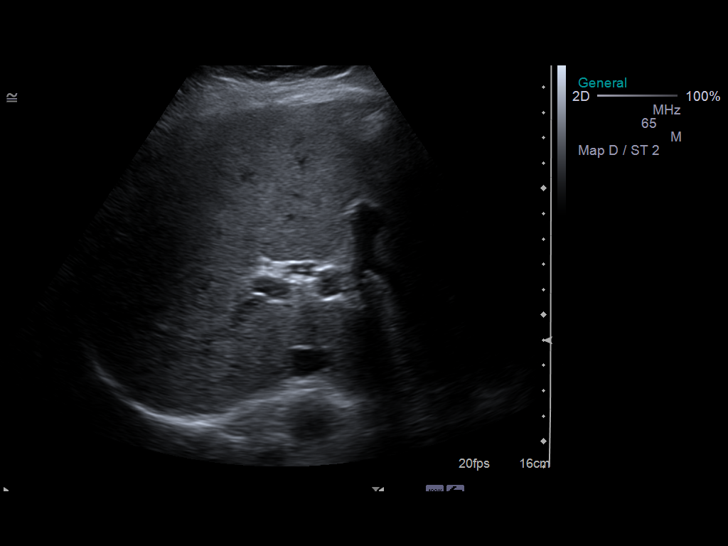
[im 41/108]
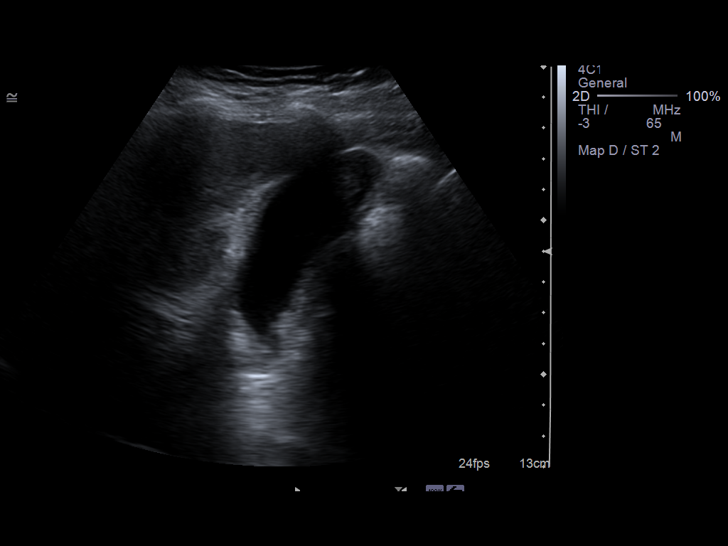
[im 50/108]
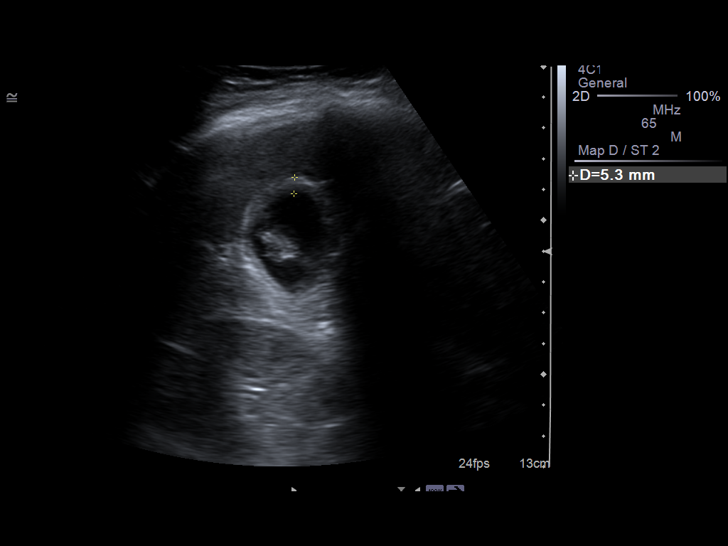
[im 58/108]
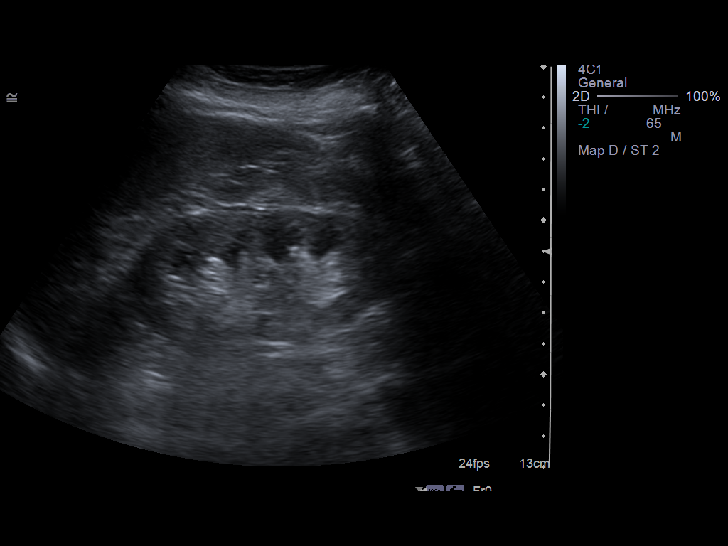
[im 67/108]
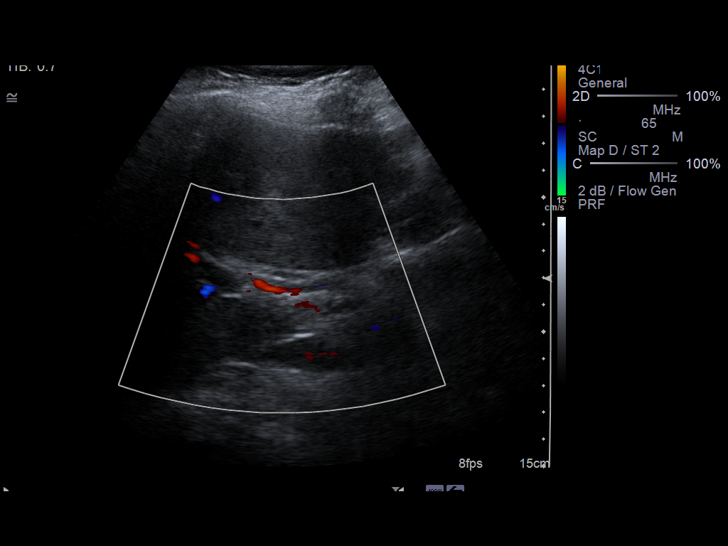
[im 72/108]
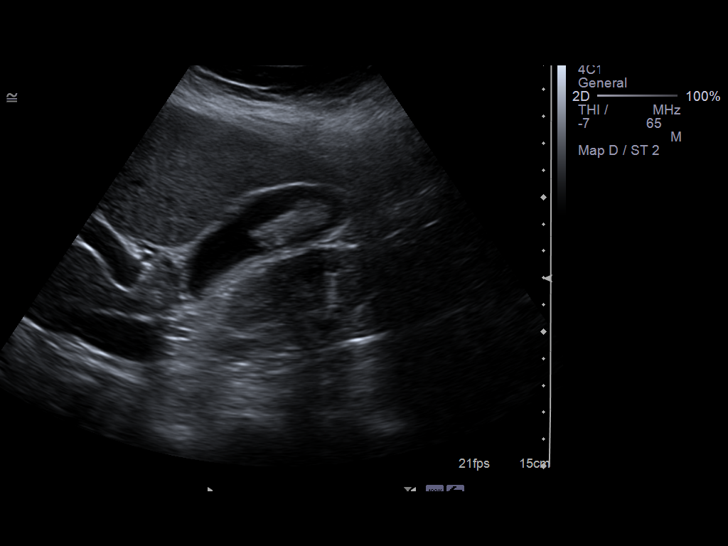
[im 81/108]
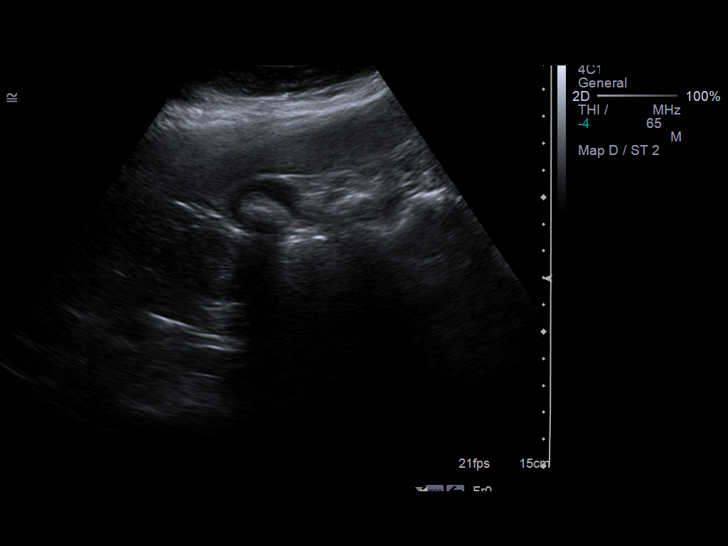
[im 90/108]
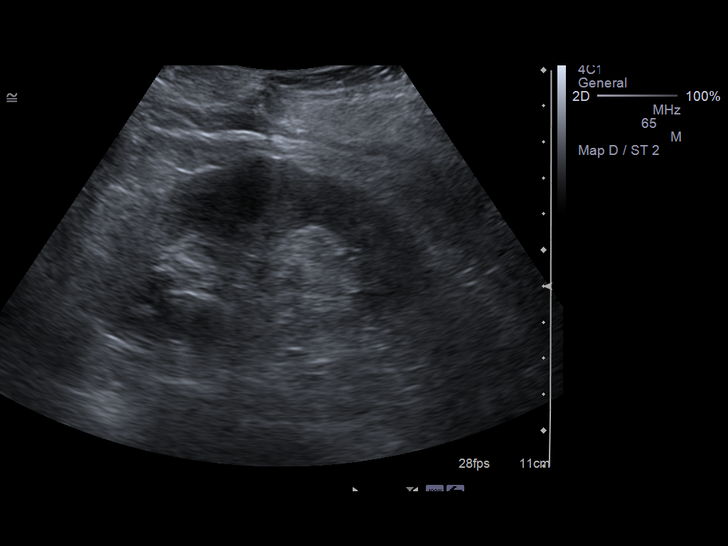
[im 99/108]
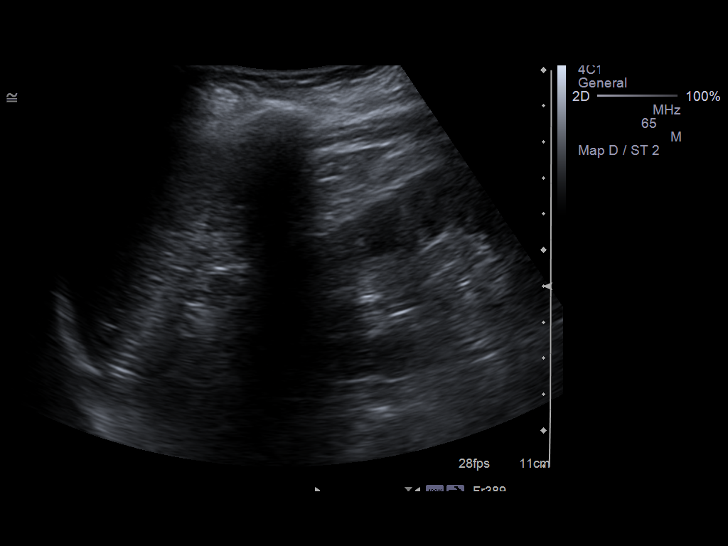
[im 108/108]
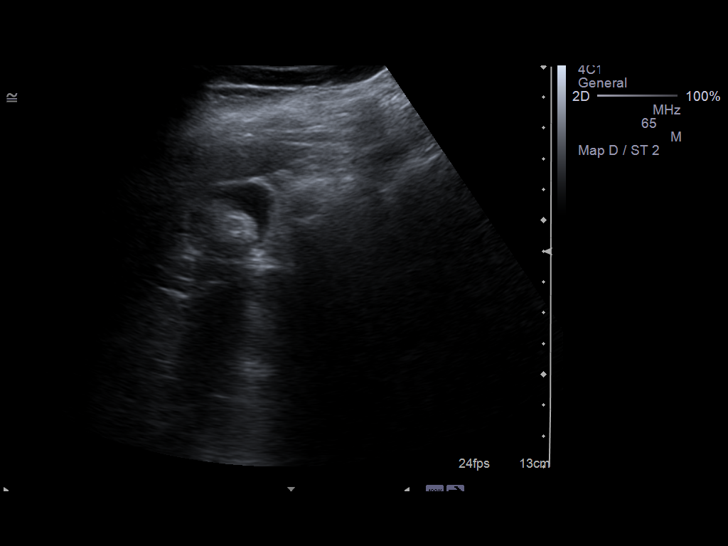

[14 of 25 positions shown; findings below may reference images not displayed]

FINDINGS: Gallbladder:  Mild gallbladder wall thickening, measuring 5 mm.
Mobile gallstone in the fundus.  No definite pericholecystic fluid.
Negative sonographic Murphy's sign.

Common bile duct:  Measures 4 mm

Liver:  No focal lesion identified. Within normal limits in
parenchymal echogenicity. No intrahepatic ductal dilatation.

IVC:  Appears normal.

Pancreas:  Incompletely visualized, unremarkable.

Spleen:  Measures 8.4 cm

Right Kidney:  Measures 9.5 cm.  No mass or hydronephrosis.

Left Kidney:  Measures 9.0 cm.  No mass or hydronephrosis.

Abdominal aorta:  No aneurysm identified.
IMPRESSION: Mild gallbladder wall thickening, measuring 5 mm.  Mobile gallstone
in the fundus.  Negative sonographic Murphy's sign.

These findings are equivocal, but unlikely to represent acute
cholecystitis.  If clinically warranted, consider hepatobiliary
nuclear medicine scan for further characterization.

## 2012-07-01 DIAGNOSIS — B379 Candidiasis, unspecified: Secondary | ICD-10-CM

## 2012-07-01 HISTORY — DX: Candidiasis, unspecified: B37.9

## 2012-07-02 LAB — CBC AND DIFFERENTIAL
HCT: 42 % (ref 36–46)
Hemoglobin: 14.7 g/dL (ref 12.0–16.0)

## 2012-10-24 ENCOUNTER — Non-Acute Institutional Stay: Payer: Medicare Other | Admitting: Internal Medicine

## 2012-10-24 VITALS — BP 152/82 | HR 60 | Ht 61.5 in | Wt 151.0 lb

## 2012-10-24 DIAGNOSIS — R609 Edema, unspecified: Secondary | ICD-10-CM

## 2012-10-24 DIAGNOSIS — R269 Unspecified abnormalities of gait and mobility: Secondary | ICD-10-CM

## 2012-10-24 DIAGNOSIS — Z299 Encounter for prophylactic measures, unspecified: Secondary | ICD-10-CM

## 2012-10-24 DIAGNOSIS — E785 Hyperlipidemia, unspecified: Secondary | ICD-10-CM

## 2012-10-24 DIAGNOSIS — R413 Other amnesia: Secondary | ICD-10-CM

## 2012-10-24 DIAGNOSIS — I10 Essential (primary) hypertension: Secondary | ICD-10-CM

## 2012-10-24 DIAGNOSIS — F0281 Dementia in other diseases classified elsewhere with behavioral disturbance: Secondary | ICD-10-CM

## 2012-10-24 NOTE — Patient Instructions (Signed)
Start aricept to help preserve memory.

## 2012-10-24 NOTE — Progress Notes (Signed)
Subjective:    Patient ID: [name], female    DOB: 1919/04/16, 79 y.o.   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 Tells people to shut up. Sat in the wrong seat in the dining room and could not be redirected, Refusing personal care, bathing.    Medication List     acetaminophen 325 MG tablet  Commonly known as:  TYLENOL  Take 650 mg by mouth. Take one tablet every six hours as needed     TUMS 500 MG chewable tablet  Generic drug:  calcium carbonate  Chew 1 tablet by mouth. Take three tablets once daily     Vitamin D 1000 UNITS capsule  Take 1,000 Units by mouth. Take 2 daily       Review of Systems  Constitutional: Negative for chills, diaphoresis, activity change, appetite change and fatigue.  HENT: Positive for hearing loss, sneezing and postnasal drip. Negative for ear pain, nosebleeds, congestion, facial swelling, neck pain, neck stiffness and tinnitus.   Eyes: Negative for photophobia, pain, discharge, itching and visual disturbance.  Respiratory: Negative for cough, choking, chest tightness, shortness of breath and stridor.   Cardiovascular: Positive for leg swelling. Negative for chest pain and palpitations.  Gastrointestinal: Positive for constipation. Negative for nausea, abdominal pain, diarrhea and abdominal distention.  Endocrine: Negative.   Genitourinary:       Incontinent  Musculoskeletal: Positive for arthralgias and gait problem. Negative for myalgias, back pain and joint swelling.  Skin: Negative.   Allergic/Immunologic: Negative.   Neurological: Negative for dizziness, tremors, seizures, syncope, facial  asymmetry, speech difficulty, light-headedness, numbness and headaches.  Hematological: Negative.   Psychiatric/Behavioral: Positive for behavioral problems, confusion, decreased concentration and agitation. Negative for hallucinations, sleep disturbance, self-injury and dysphoric mood. The patient is nervous/anxious. The patient is not hyperactive.        Objective:BP 152/82  Pulse 60  Ht 5' 1.5" (1.562 m)  Wt 151 lb (68.493 kg)  BMI 28.07 kg/m2    Physical Exam  Constitutional: She is oriented to person, place, and time.  overweight  HENT:  Mild loss of hearing  Eyes:  Corrective lenses.  Neck: No JVD present. No thyromegaly present.  Cardiovascular: Normal rate, regular rhythm, normal heart sounds and intact distal pulses.  Exam reveals no gallop and no friction rub.   No murmur heard. Pulmonary/Chest: Effort normal and breath sounds normal. No respiratory distress. She has no wheezes. She has no rales. She exhibits no tenderness.  Abdominal: Bowel sounds are normal. She exhibits no distension and no mass. There is no tenderness.  Musculoskeletal: Normal range of motion. She exhibits no edema and no tenderness.  Lymphadenopathy:    She has no cervical adenopathy.  Neurological: She is alert and oriented to person, place, and time. No cranial nerve deficit. Coordination normal.  Skin: No rash noted. No erythema. No pallor.  Psychiatric: She has a normal mood and affect.      Assessment & Plan:  Memory loss: Start donepezil  Abnormality of gait: continue walkeer  Unspecified essential hypertension: controlled  Other and unspecified hyperlipidemia: recheck in the future  Edema: observe  Preventive measure - Plan: DNR (Do Not Resuscitate): reconfirmed  Dementia in conditions classified  elsewhere with behavioral disturbance(294.11)   - Plan: donepezil (ARICEPT) 5 MG tablet, donepezil (ARICEPT) 10 MG tablet

## 2012-10-24 NOTE — Progress Notes (Signed)
Failed clock drawing  

## 2012-10-26 DIAGNOSIS — F0390 Unspecified dementia without behavioral disturbance: Secondary | ICD-10-CM | POA: Insufficient documentation

## 2012-10-26 MED ORDER — DONEPEZIL HCL 5 MG PO TABS: ORAL_TABLET | ORAL | Status: DC

## 2012-10-26 MED ORDER — DONEPEZIL HCL 10 MG PO TABS: ORAL_TABLET | ORAL | Status: DC

## 2012-12-05 ENCOUNTER — Non-Acute Institutional Stay: Payer: Medicare Other | Admitting: Internal Medicine

## 2012-12-05 ENCOUNTER — Encounter: Payer: Self-pay | Admitting: Internal Medicine

## 2012-12-05 VITALS — BP 132/72 | HR 60 | Ht 61.5 in | Wt 154.0 lb

## 2012-12-05 DIAGNOSIS — I10 Essential (primary) hypertension: Secondary | ICD-10-CM

## 2012-12-05 DIAGNOSIS — R609 Edema, unspecified: Secondary | ICD-10-CM

## 2012-12-05 DIAGNOSIS — F0281 Dementia in other diseases classified elsewhere with behavioral disturbance: Secondary | ICD-10-CM

## 2012-12-05 DIAGNOSIS — R269 Unspecified abnormalities of gait and mobility: Secondary | ICD-10-CM

## 2012-12-05 NOTE — Progress Notes (Signed)
  Subjective:    Patient ID: [name], female    DOB: 1918/08/20, 79 y.o.   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 Main reason for visit is to assess how she is doing since starting donepezil about 6 weeks ago. Is now on 10 mg qd.  Unspecified essential hypertension: controlled  Edema: improved  Abnormality of gait: unchanged. Using rolling walker.    Current Outpatient Prescriptions on File Prior to Visit  Medication Sig Dispense Refill  . acetaminophen (TYLENOL) 325 MG tablet Take 325 mg by mouth. Take one tablet every six hours as needed      . calcium carbonate (TUMS) 500 MG chewable tablet Chew 1 tablet by mouth. Take three tablets once daily      . Cholecalciferol (VITAMIN D) 1000 UNITS capsule Take 1,000 Units by mouth. Take 2 daily      . donepezil (ARICEPT) 10 MG tablet One daily to help preserve memory. Start after taking 5 mg daily for 3 weeks.  30 tablet  5   No current facility-administered medications on file prior to visit.    Review of Systems  Constitutional: Negative for chills, diaphoresis, activity change, appetite change and fatigue.  HENT: Positive for hearing loss, sneezing and postnasal drip. Negative for ear pain, nosebleeds, congestion, facial swelling, neck pain, neck stiffness and tinnitus.   Eyes: Negative for photophobia, pain, discharge, itching and visual disturbance.  Respiratory: Negative for cough, choking, chest tightness, shortness of breath and stridor.   Cardiovascular: Positive for leg swelling. Negative for chest pain and palpitations.  Gastrointestinal: Positive for constipation. Negative for nausea, abdominal pain, diarrhea and abdominal distention.  Endocrine: Negative.   Genitourinary:       Incontinent  Musculoskeletal: Positive for arthralgias and gait problem. Negative for myalgias, back pain and joint swelling.  Skin:  Negative.   Allergic/Immunologic: Negative.   Neurological: Negative for dizziness, tremors, seizures, syncope, facial asymmetry, speech difficulty, light-headedness, numbness and headaches.  Hematological: Negative.   Psychiatric/Behavioral: Positive for behavioral problems, confusion, decreased concentration and agitation. Negative for hallucinations, sleep disturbance, self-injury and dysphoric mood. The patient is nervous/anxious. The patient is not hyperactive.        Objective:BP 132/72  Pulse 60  Ht 5' 1.5" (1.562 m)  Wt 154 lb (69.854 kg)  BMI 28.63 kg/m2    Physical Exam  Constitutional:  overweight  HENT:  Mild loss of hearing  Eyes:  Corrective lenses.  Neck: No JVD present. No thyromegaly present.  Cardiovascular: Normal rate, regular rhythm, normal heart sounds and intact distal pulses.  Exam reveals no gallop and no friction rub.   No murmur heard. Pulmonary/Chest: Effort normal and breath sounds normal. No respiratory distress. She has no wheezes. She has no rales. She exhibits no tenderness.  Abdominal: Bowel sounds are normal. She exhibits no distension and no mass. There is no tenderness.  Musculoskeletal: Normal range of motion. She exhibits no edema and no tenderness.  Lymphadenopathy:    She has no cervical adenopathy.  Neurological: She is alert. No cranial nerve deficit. Coordination normal.  Skin: No rash noted. No erythema. No pallor.  Psychiatric: She has a normal mood and affect.          Assessment & Plan:  Dementia in conditions classified elsewhere with behavioral disturbance(294.11): continuedonepezil  Unspecified essential hypertension: controlled  Edema: stable.  Abnormality of gait: unchanged

## 2013-03-27 ENCOUNTER — Encounter: Payer: Self-pay | Admitting: Internal Medicine

## 2013-03-27 ENCOUNTER — Non-Acute Institutional Stay: Payer: Medicare Other | Admitting: Internal Medicine

## 2013-03-27 VITALS — BP 144/74 | HR 56 | Ht 61.5 in | Wt 160.0 lb

## 2013-03-27 DIAGNOSIS — E785 Hyperlipidemia, unspecified: Secondary | ICD-10-CM

## 2013-03-27 DIAGNOSIS — F02818 Dementia in other diseases classified elsewhere, unspecified severity, with other behavioral disturbance: Secondary | ICD-10-CM

## 2013-03-27 DIAGNOSIS — R269 Unspecified abnormalities of gait and mobility: Secondary | ICD-10-CM

## 2013-03-27 DIAGNOSIS — I10 Essential (primary) hypertension: Secondary | ICD-10-CM

## 2013-03-27 DIAGNOSIS — F0281 Dementia in other diseases classified elsewhere with behavioral disturbance: Secondary | ICD-10-CM

## 2013-03-27 DIAGNOSIS — R609 Edema, unspecified: Secondary | ICD-10-CM

## 2013-03-27 NOTE — Patient Instructions (Signed)
Continue currentr medications

## 2013-03-27 NOTE — Progress Notes (Signed)
   Subjective:    Patient ID: [name], female    DOB: 11-16-1918, 79 y.o.   MRN: [id number]  Chief Complaint  Patient presents with  . Medical Managment of Chronic Issues    blood pressure, dementia, edema    HPI Unspecified essential hypertension  Dementia in conditions classified elsewhere with behavioral disturbance(294.11)  Abnormality of gait: using 4 wheel walker with seat and brakes  Other and unspecified hyperlipidemia; needs  To be rechecked  Edema: stable bipedal    Current Outpatient Prescriptions on File Prior to Visit  Medication Sig Dispense Refill  . acetaminophen (TYLENOL) 325 MG tablet Take 325 mg by mouth. Take one tablet every six hours as needed      . calcium carbonate (TUMS) 500 MG chewable tablet Chew 1 tablet by mouth. Take three tablets once daily      . Cholecalciferol (VITAMIN D) 1000 UNITS capsule Take 1,000 Units by mouth. Take 2 daily       No current facility-administered medications on file prior to visit.    Review of Systems  Constitutional: Negative for chills, diaphoresis, activity change, appetite change and fatigue.  HENT: Positive for hearing loss, postnasal drip and sneezing. Negative for congestion, ear pain, facial swelling, nosebleeds and tinnitus.   Eyes: Negative for photophobia, pain, discharge, itching and visual disturbance.  Respiratory: Negative for cough, choking, chest tightness, shortness of breath and stridor.   Cardiovascular: Positive for leg swelling. Negative for chest pain and palpitations.  Gastrointestinal: Positive for constipation. Negative for nausea, abdominal pain, diarrhea and abdominal distention.  Endocrine: Negative.   Genitourinary:       Incontinent  Musculoskeletal: Positive for arthralgias and gait problem. Negative for back pain, joint swelling, myalgias, neck pain and neck stiffness.  Skin: Negative.   Allergic/Immunologic: Negative.   Neurological: Negative for dizziness, tremors, seizures,  syncope, facial asymmetry, speech difficulty, light-headedness, numbness and headaches.  Hematological: Negative.   Psychiatric/Behavioral: Positive for behavioral problems, confusion, decreased concentration and agitation. Negative for hallucinations, sleep disturbance, self-injury and dysphoric mood. The patient is nervous/anxious. The patient is not hyperactive.        Objective:BP 144/74  Pulse 56  Ht 5' 1.5" (1.562 m)  Wt 160 lb (72.576 kg)  BMI 29.75 kg/m2    Physical Exam  Constitutional:  overweight  HENT:  Mild loss of hearing  Eyes:  Corrective lenses.  Neck: No JVD present. No thyromegaly present.  Cardiovascular: Normal rate, regular rhythm, normal heart sounds and intact distal pulses.  Exam reveals no gallop and no friction rub.   No murmur heard. Pulmonary/Chest: Effort normal and breath sounds normal. No respiratory distress. She has no wheezes. She has no rales. She exhibits no tenderness.  Abdominal: Bowel sounds are normal. She exhibits no distension and no mass. There is no tenderness.  Musculoskeletal: Normal range of motion. She exhibits no edema and no tenderness.  Lymphadenopathy:    She has no cervical adenopathy.  Neurological: She is alert. No cranial nerve deficit. Coordination normal.  Skin: No rash noted. No erythema. No pallor.  Psychiatric: She has a normal mood and affect.    LAB REVIEW 07/02/12 CBC; normal  CMP; normal  TSH: 2.918  Vit 22      Assessment & Plan:  Unspecified essential hypertension: controlled  Dementia in conditions classified elsewhere with behavioral disturbance(294.11): unchanged. Tolerating Aricept  Abnormality of gait: benefits from walker  Other and unspecified hyperlipidemia: f/u lipids  Edema: stable

## 2013-04-01 LAB — BASIC METABOLIC PANEL
BUN: 27 mg/dL — AB (ref 4–21)
BUN: 27 mg/dL — AB (ref 4–21)
CREATININE: 1.2 mg/dL — AB (ref <=1.1)
Creatinine: 1.2 mg/dL — AB (ref 0.5–1.1)
GLUCOSE: 88 mg/dL
Glucose: 88 mg/dL
POTASSIUM: 4.2 mmol/L (ref 3.4–5.3)
Potassium: 4.2 mmol/L (ref 3.4–5.3)
Sodium: 140 mmol/L (ref 137–147)
Sodium: 140 mmol/L (ref 137–147)
Sodium: 140 mmol/L (ref 137–147)

## 2013-04-01 LAB — HEPATIC FUNCTION PANEL
ALK PHOS: 82 U/L (ref 25–125)
ALT: 20 U/L (ref 7–35)
ALT: 20 U/L (ref 7–35)
AST: 19 U/L (ref 13–35)
AST: 19 U/L (ref 13–35)
Alkaline Phosphatase: 82 U/L (ref 25–125)
BILIRUBIN, TOTAL: 0.5 mg/dL
BILIRUBIN, TOTAL: 0.5 mg/dL

## 2013-04-01 LAB — LIPID PANEL
HDL: 54 mg/dL (ref 35–70)
LDL Cholesterol: 200 mg/dL
Triglycerides: 214 mg/dL — AB (ref 40–160)

## 2013-04-14 ENCOUNTER — Encounter: Payer: Self-pay | Admitting: Nurse Practitioner

## 2013-04-14 ENCOUNTER — Non-Acute Institutional Stay: Payer: Medicare Other | Admitting: Nurse Practitioner

## 2013-04-14 DIAGNOSIS — J069 Acute upper respiratory infection, unspecified: Secondary | ICD-10-CM | POA: Insufficient documentation

## 2013-04-14 DIAGNOSIS — F0281 Dementia in other diseases classified elsewhere with behavioral disturbance: Secondary | ICD-10-CM

## 2013-04-14 DIAGNOSIS — E785 Hyperlipidemia, unspecified: Secondary | ICD-10-CM

## 2013-04-14 DIAGNOSIS — I10 Essential (primary) hypertension: Secondary | ICD-10-CM

## 2013-04-14 NOTE — Assessment & Plan Note (Signed)
Controlled without meds  

## 2013-04-14 NOTE — Assessment & Plan Note (Signed)
Nasal and central congestion: Augmentin 875mg  bid x 7days. Atrovent nasal 0.06% spray II each nostril tid for 7 days. Mucinex 600mg  bid for 5 days. Observe the patient.

## 2013-04-14 NOTE — Assessment & Plan Note (Signed)
LDL 200 04/01/13-the patient desires diet control.     

## 2013-04-14 NOTE — Assessment & Plan Note (Signed)
Gradual decline, takes Aricept, still able to function at AL level.    

## 2013-04-14 NOTE — Progress Notes (Signed)
Patient ID: [name], female   DOB: [birth year], 79 y.o.   MRN: [id number]   Code Status: DNR  Allergies  Allergen Reactions  . Lopid [Gemfibrozil]   . Sulfa Antibiotics   . Zocor [Simvastatin]     Chief Complaint  Patient presents with  . Medical Managment of Chronic Issues    congetive cough and low grade T 4 days.   . Acute Visit    HPI: Patient is a 79 y.o. female seen in the [facility] at [facility] today for evaluation of low grade T, congestive cough 4 days, and other chronic medical conditions.  Problem List Items Addressed This Visit   Other and unspecified hyperlipidemia - Primary     LDL 200 04/01/13-the patient desires diet control.     Unspecified essential hypertension     Controlled without meds.     Dementia in conditions classified elsewhere with behavioral disturbance(294.11)     Gradual decline, takes Aricept, still able to function at AL level.     Acute upper respiratory infections of unspecified site     Nasal and central congestion: Augmentin 875mg  bid x 7days. Atrovent nasal 0.06% spray II each nostril tid for 7 days. Mucinex 600mg  bid for 5 days. Observe the patient.        Review of Systems:  Review of Systems  Constitutional: Positive for fever and malaise/fatigue. Negative for chills, weight loss and diaphoresis.  HENT: Positive for congestion and hearing loss. Negative for ear discharge, ear pain, nosebleeds, sore throat and tinnitus.   Eyes: Negative for blurred vision, double vision, photophobia, pain, discharge and redness.  Respiratory: Positive for cough and sputum production. Negative for hemoptysis, shortness of breath, wheezing and stridor.   Cardiovascular: Negative for chest pain, palpitations, orthopnea, claudication, leg swelling and PND.  Gastrointestinal: Negative for heartburn, nausea, vomiting, abdominal pain, diarrhea, constipation, blood in stool and melena.  Genitourinary: Negative for dysuria, urgency, frequency,  hematuria and flank pain.  Musculoskeletal: Negative for back pain, falls, joint pain, myalgias and neck pain.  Skin: Negative for itching and rash.  Neurological: Negative for dizziness, tingling, tremors, sensory change, speech change, focal weakness, seizures, loss of consciousness, weakness and headaches.  Endo/Heme/Allergies: Negative for environmental allergies and polydipsia. Does not bruise/bleed easily.  Psychiatric/Behavioral: Positive for memory loss. Negative for depression, suicidal ideas, hallucinations and substance abuse. The patient is not nervous/anxious and does not have insomnia.      Past Medical History  Diagnosis Date  . Candidiasis 07/01/2012  . Blood in stool 06/29/2011  . Uterine prolapse without mention of vaginal wall prolapse 06/29/2011  . Abnormality of gait 03/02/2011  . Cholelithiasis 08/02/2010  . Memory loss 02/17/2010  . Lumbago 12/22/2009  . Personal history of fall 12/22/2009  . Disturbance of skin sensation 11/25/2009  . Unspecified vitamin D deficiency 08/19/2009  . Unspecified constipation 09/26/2007  . Other and unspecified hyperlipidemia 03/28/2007  . Pain in joint, upper arm 10/25/2006  . Abnormal mammogram, unspecified 10/25/2006  . Unspecified essential hypertension 07/19/2006  . Osteoarthrosis, unspecified whether generalized or localized, unspecified site 07/19/2006  . Edema 07/19/2006  . Allergic rhinitis due to pollen 07/13/2006  . Dizziness and giddiness 07/13/2006  . Diverticulitis of colon (without mention of hemorrhage) 10/28/2003   Past Surgical History  Procedure Laterality Date  . Cataract extraction w/ intraocular lens  implant, bilateral  1999   Social History:   reports that she has never smoked. She has never used smokeless tobacco. She reports that  she does not drink alcohol or use illicit drugs.  Family History  Problem Relation Age of Onset  . Heart disease Father     MI    Medications: Patient's Medications  New Prescriptions    No medications on file  Previous Medications   ACETAMINOPHEN (TYLENOL) 325 MG TABLET    Take 325 mg by mouth. Take one tablet every six hours as needed   CALCIUM CARBONATE (TUMS) 500 MG CHEWABLE TABLET    Chew 1 tablet by mouth. Take three tablets once daily   CHOLECALCIFEROL (VITAMIN D) 1000 UNITS CAPSULE    Take 1,000 Units by mouth. Take 2 daily   DONEPEZIL (ARICEPT) 10 MG TABLET    One daily to help preserve memory.  Modified Medications   No medications on file  Discontinued Medications   No medications on file     Physical Exam: Physical Exam  Constitutional:  overweight  HENT:  Mild loss of hearing. Nasal and central congestion.   Eyes:  Corrective lenses.  Neck: No JVD present. No thyromegaly present.  Cardiovascular: Normal rate, regular rhythm, normal heart sounds and intact distal pulses.  Exam reveals no gallop and no friction rub.   No murmur heard. Pulmonary/Chest: Effort normal and breath sounds normal. No respiratory distress. She has no wheezes. She has no rales. She exhibits no tenderness.  Central congestion.   Abdominal: Bowel sounds are normal. She exhibits no distension and no mass. There is no tenderness.  Musculoskeletal: Normal range of motion. She exhibits no edema and no tenderness.  Lymphadenopathy:    She has no cervical adenopathy.  Neurological: She is alert. No cranial nerve deficit. Coordination normal.  Skin: No rash noted. No erythema. No pallor.  Psychiatric: She has a normal mood and affect.    Filed Vitals:   04/14/13 1748  BP: 140/62  Pulse: 74  Temp: 98.9 F (37.2 C)  TempSrc: Tympanic  Resp: 18      Labs reviewed: Basic Metabolic Panel:  Recent Labs  04/54/09 04/01/13  NA  --  140  K  --  4.2  BUN  --  17  CREATININE  --  1.2*  TSH 2.92  --    Liver Function Tests:  Recent Labs  04/01/13  AST 19  ALT 20  ALKPHOS 82   CBC:  Recent Labs  07/02/12  WBC 5.9  HGB 14.7  HCT 42  PLT 244   Lipid  Panel:  Recent Labs  04/01/13  CHOL 297*  HDL 54  LDLCALC 200  TRIG 811*    Assessment/Plan Other and unspecified hyperlipidemia LDL 200 04/01/13-the patient desires diet control.   Dementia in conditions classified elsewhere with behavioral disturbance(294.11) Gradual decline, takes Aricept, still able to function at AL level.   Unspecified essential hypertension Controlled without meds.   Acute upper respiratory infections of unspecified site Nasal and central congestion: Augmentin 875mg  bid x 7days. Atrovent nasal 0.06% spray II each nostril tid for 7 days. Mucinex 600mg  bid for 5 days. Observe the patient.     Family/ Staff Communication: observe the patient.   Goals of Care: AL  Labs/tests ordered: none

## 2013-04-24 ENCOUNTER — Encounter: Payer: Self-pay | Admitting: Nurse Practitioner

## 2013-04-24 ENCOUNTER — Non-Acute Institutional Stay: Payer: Medicare Other | Admitting: Nurse Practitioner

## 2013-04-24 DIAGNOSIS — E785 Hyperlipidemia, unspecified: Secondary | ICD-10-CM

## 2013-04-24 DIAGNOSIS — R609 Edema, unspecified: Secondary | ICD-10-CM

## 2013-04-24 DIAGNOSIS — F02818 Dementia in other diseases classified elsewhere, unspecified severity, with other behavioral disturbance: Secondary | ICD-10-CM

## 2013-04-24 DIAGNOSIS — J069 Acute upper respiratory infection, unspecified: Secondary | ICD-10-CM

## 2013-04-24 DIAGNOSIS — R5381 Other malaise: Secondary | ICD-10-CM

## 2013-04-24 DIAGNOSIS — F0281 Dementia in other diseases classified elsewhere with behavioral disturbance: Secondary | ICD-10-CM

## 2013-04-24 DIAGNOSIS — I10 Essential (primary) hypertension: Secondary | ICD-10-CM

## 2013-04-24 DIAGNOSIS — R5383 Other fatigue: Principal | ICD-10-CM

## 2013-04-24 NOTE — Assessment & Plan Note (Signed)
Fully treated and asymptomatic clinically. 

## 2013-04-24 NOTE — Assessment & Plan Note (Signed)
Not apparent today.   

## 2013-04-24 NOTE — Assessment & Plan Note (Signed)
Most likely related recent respiratory episode-physical examination today is unremarkable. Will continue to monitor the patient.

## 2013-04-24 NOTE — Assessment & Plan Note (Signed)
Controlled.  

## 2013-04-24 NOTE — Progress Notes (Signed)
Patient ID: [name], female   DOB: 04-20-1918, 80 y.o.   MRN: [id number]   Code Status: DNR  Allergies  Allergen Reactions  . Lopid [Gemfibrozil]   . Sulfa Antibiotics   . Zocor [Simvastatin]     Chief Complaint  Patient presents with  . Medical Managment of Chronic Issues    malaise  . Acute Visit    HPI: Patient is a 80 y.o. female seen in the AL-RBC at [facility] today for evaluation of malaise and other chronic medical conditions.  Problem List Items Addressed This Visit   Other and unspecified hyperlipidemia     LDL 200 04/01/13-the patient desires diet control.       Unspecified essential hypertension     Controlled.     Edema     Not apparent today.     Dementia in conditions classified elsewhere with behavioral disturbance(294.11)     Gradual decline, takes Aricept, still able to function at AL level.       Acute upper respiratory infections of unspecified site     Fully treated and asymptomatic clinically.     Other malaise and fatigue - Primary     Most likely related recent respiratory episode-physical examination today is unremarkable. Will continue to monitor the patient.        Review of Systems:  Review of Systems  Constitutional: Positive for malaise/fatigue. Negative for fever, chills, weight loss and diaphoresis.  HENT: Positive for hearing loss. Negative for congestion, ear discharge, ear pain, nosebleeds, sore throat and tinnitus.   Eyes: Negative for blurred vision, double vision, photophobia, pain, discharge and redness.  Respiratory: Negative for cough, hemoptysis, sputum production, shortness of breath, wheezing and stridor.   Cardiovascular: Negative for chest pain, palpitations, orthopnea, claudication, leg swelling and PND.  Gastrointestinal: Negative for heartburn, nausea, vomiting, abdominal pain, diarrhea, constipation, blood in stool and melena.  Genitourinary: Negative for dysuria, urgency, frequency, hematuria and  flank pain.  Musculoskeletal: Negative for back pain, falls, joint pain, myalgias and neck pain.  Skin: Negative for itching and rash.  Neurological: Negative for dizziness, tingling, tremors, sensory change, speech change, focal weakness, seizures, loss of consciousness, weakness and headaches.  Endo/Heme/Allergies: Negative for environmental allergies and polydipsia. Does not bruise/bleed easily.  Psychiatric/Behavioral: Positive for memory loss. Negative for depression, suicidal ideas, hallucinations and substance abuse. The patient is not nervous/anxious and does not have insomnia.      Past Medical History  Diagnosis Date  . Candidiasis 07/01/2012  . Blood in stool 06/29/2011  . Uterine prolapse without mention of vaginal wall prolapse 06/29/2011  . Abnormality of gait 03/02/2011  . Cholelithiasis 08/02/2010  . Memory loss 02/17/2010  . Lumbago 12/22/2009  . Personal history of fall 12/22/2009  . Disturbance of skin sensation 11/25/2009  . Unspecified vitamin D deficiency 08/19/2009  . Unspecified constipation 09/26/2007  . Other and unspecified hyperlipidemia 03/28/2007  . Pain in joint, upper arm 10/25/2006  . Abnormal mammogram, unspecified 10/25/2006  . Unspecified essential hypertension 07/19/2006  . Osteoarthrosis, unspecified whether generalized or localized, unspecified site 07/19/2006  . Edema 07/19/2006  . Allergic rhinitis due to pollen 07/13/2006  . Dizziness and giddiness 07/13/2006  . Diverticulitis of colon (without mention of hemorrhage) 10/28/2003   Past Surgical History  Procedure Laterality Date  . Cataract extraction w/ intraocular lens  implant, bilateral  1999   Social History:   reports that she has never smoked. She has never used smokeless tobacco. She reports that she  does not drink alcohol or use illicit drugs.  Family History  Problem Relation Age of Onset  . Heart disease Father     MI    Medications: Patient's Medications  New Prescriptions   No medications  on file  Previous Medications   ACETAMINOPHEN (TYLENOL) 325 MG TABLET    Take 325 mg by mouth. Take one tablet every six hours as needed   CALCIUM CARBONATE (TUMS) 500 MG CHEWABLE TABLET    Chew 3 tablets by mouth daily. Take three tablets once daily   CHOLECALCIFEROL (VITAMIN D) 1000 UNITS CAPSULE    Take 1,000 Units by mouth. Take 2 daily   DONEPEZIL (ARICEPT) 10 MG TABLET    One daily to help preserve memory.  Modified Medications   No medications on file  Discontinued Medications   No medications on file     Physical Exam: Physical Exam  Constitutional:  overweight  HENT:  Mild loss of hearing. Nasal and central congestion.   Eyes:  Corrective lenses.  Neck: No JVD present. No thyromegaly present.  Cardiovascular: Normal rate, regular rhythm, normal heart sounds and intact distal pulses.  Exam reveals no gallop and no friction rub.   No murmur heard. Pulmonary/Chest: Effort normal and breath sounds normal. No respiratory distress. She has no wheezes. She has no rales. She exhibits no tenderness.  Abdominal: Bowel sounds are normal. She exhibits no distension and no mass. There is no tenderness.  Musculoskeletal: Normal range of motion. She exhibits no edema and no tenderness.  Lymphadenopathy:    She has no cervical adenopathy.  Neurological: She is alert. No cranial nerve deficit. Coordination normal.  Skin: No rash noted. No erythema. No pallor.  Psychiatric: She has a normal mood and affect.    Filed Vitals:   04/24/13 1417  BP: 100/55  Pulse: 70  Temp: 97.5 F (36.4 C)  TempSrc: Tympanic  Resp: 18      Labs reviewed: Basic Metabolic Panel:  Recent Labs  16/01/9602/18/14 04/01/13  NA  --  140  140  K  --  4.2  4.2  BUN  --  17  27*  CREATININE  --  1.2*  1.2*  TSH 2.92  --    Liver Function Tests:  Recent Labs  04/01/13  AST 19  19  ALT 20  20  ALKPHOS 82  82   CBC:  Recent Labs  07/02/12  WBC 5.9  HGB 14.7  HCT 42  PLT 244   Lipid  Panel:  Recent Labs  04/01/13  CHOL 297*  HDL 54  LDLCALC 200  TRIG [id number]*    Assessment/Plan Other malaise and fatigue Most likely related recent respiratory episode-physical examination today is unremarkable. Will continue to monitor the patient.   Acute upper respiratory infections of unspecified site Fully treated and asymptomatic clinically.   Dementia in conditions classified elsewhere with behavioral disturbance(294.11) Gradual decline, takes Aricept, still able to function at AL level.     Edema Not apparent today.   Unspecified essential hypertension Controlled.   Other and unspecified hyperlipidemia LDL 200 04/01/13-the patient desires diet control.       Family/ Staff Communication: observe the patient.   Goals of Care: AL  Labs/tests ordered: none

## 2013-04-24 NOTE — Assessment & Plan Note (Signed)
Gradual decline, takes Aricept, still able to function at AL level.

## 2013-04-24 NOTE — Assessment & Plan Note (Signed)
LDL 200 04/01/13-the patient desires diet control.     

## 2013-06-05 ENCOUNTER — Encounter: Payer: Self-pay | Admitting: Nurse Practitioner

## 2013-06-05 ENCOUNTER — Non-Acute Institutional Stay: Payer: Medicare Other | Admitting: Nurse Practitioner

## 2013-06-05 DIAGNOSIS — L02229 Furuncle of trunk, unspecified: Principal | ICD-10-CM

## 2013-06-05 DIAGNOSIS — L02222 Furuncle of back [any part, except buttock]: Secondary | ICD-10-CM | POA: Insufficient documentation

## 2013-06-05 DIAGNOSIS — R269 Unspecified abnormalities of gait and mobility: Secondary | ICD-10-CM

## 2013-06-05 DIAGNOSIS — L02239 Carbuncle of trunk, unspecified: Secondary | ICD-10-CM | POA: Insufficient documentation

## 2013-06-05 DIAGNOSIS — F0281 Dementia in other diseases classified elsewhere with behavioral disturbance: Secondary | ICD-10-CM

## 2013-06-05 DIAGNOSIS — F02818 Dementia in other diseases classified elsewhere, unspecified severity, with other behavioral disturbance: Secondary | ICD-10-CM

## 2013-06-05 NOTE — Assessment & Plan Note (Signed)
The right upper back, opened, [facility], Doxycycline 100mg  bid for total 10 days started-tolerated so far.

## 2013-06-05 NOTE — Assessment & Plan Note (Signed)
The right upper back, opened, s/p I+D, [facility], Doxycycline 100mg  bid for total 10 days started-tolerated so far.

## 2013-06-05 NOTE — Assessment & Plan Note (Signed)
Gradual decline, takes Aricept, prn Ativan available to her, still able to function at AL level  

## 2013-06-05 NOTE — Assessment & Plan Note (Signed)
Ambulates with walker on unit.  

## 2013-06-05 NOTE — Progress Notes (Signed)
Patient ID: Sierra PeekSarah I Curry, female   DOB: 10/31/1918, 80 y.o.   MRN: [id number]   Code Status: DNR  Allergies  Allergen Reactions  . Lopid [Gemfibrozil]   . Sulfa Antibiotics   . Zocor [Simvastatin]     Chief Complaint  Patient presents with  . Medical Managment of Chronic Issues    upper right back boil.   . Acute Visit    HPI: Patient is a 80 y.o. female [facility] for evaluation right upper back boil and other chronic medical conditions.  Problem List Items Addressed This Visit   Dementia in conditions classified elsewhere with behavioral disturbance(294.11)     Gradual decline, takes Aricept, prn Ativan available to her, still able to function at AL level    Carbuncle and furuncle of trunk - Primary     The right upper back, opened, s/p I+D, [facility], Doxycycline 100mg  bid for total 10 days started-tolerated so far.      Abnormality of gait     Ambulates with walker on unit       Review of Systems:  Review of Systems  Constitutional: Positive for malaise/fatigue. Negative for fever, chills, weight loss and diaphoresis.  HENT: Positive for hearing loss. Negative for congestion, ear discharge, ear pain, nosebleeds, sore throat and tinnitus.   Eyes: Negative for blurred vision, double vision, photophobia, pain, discharge and redness.  Respiratory: Negative for cough, hemoptysis, sputum production, shortness of breath, wheezing and stridor.   Cardiovascular: Negative for chest pain, palpitations, orthopnea, claudication, leg swelling and PND.  Gastrointestinal: Negative for heartburn, nausea, vomiting, abdominal pain, diarrhea, constipation, blood in stool and melena.  Genitourinary: Negative for dysuria, urgency, frequency, hematuria and flank pain.  Musculoskeletal: Negative for back pain, falls, joint pain, myalgias and neck pain.  Skin: Negative for itching and rash.       right upper back carbuncle.     Neurological: Negative for dizziness, tingling, tremors, sensory change, speech change, focal weakness, seizures, loss of consciousness, weakness and headaches.  Endo/Heme/Allergies: Negative for environmental allergies and polydipsia. Does not bruise/bleed easily.  Psychiatric/Behavioral: Positive for memory loss. Negative for depression, suicidal ideas, hallucinations and substance abuse. The patient is not nervous/anxious and does not have insomnia.      Past Medical History  Diagnosis Date  . Candidiasis 07/01/2012  . Blood in stool 06/29/2011  . Uterine prolapse without mention of vaginal wall prolapse 06/29/2011  . Abnormality of gait 03/02/2011  . Cholelithiasis 08/02/2010  . Memory loss 02/17/2010  . Lumbago 12/22/2009  . Personal history of fall 12/22/2009  . Disturbance of skin sensation 11/25/2009  . Unspecified vitamin D deficiency 08/19/2009  . Unspecified constipation 09/26/2007  . Other and unspecified hyperlipidemia 03/28/2007  . Pain in joint, upper arm 10/25/2006  . Abnormal mammogram, unspecified 10/25/2006  . Unspecified essential hypertension 07/19/2006  . Osteoarthrosis, unspecified whether generalized or localized, unspecified site 07/19/2006  . Edema 07/19/2006  . Allergic rhinitis due to pollen 07/13/2006  . Dizziness and giddiness 07/13/2006  . Diverticulitis of colon (without mention of hemorrhage) 10/28/2003   Past Surgical History  Procedure Laterality Date  . Cataract extraction w/ intraocular lens  implant, bilateral  1999   Social History:   reports that she has never smoked. She has never used smokeless tobacco. She reports that she does not drink alcohol or use illicit drugs.  Family History  Problem Relation Age of Onset  . Heart  disease Father     MI    Medications: Patient's Medications  New Prescriptions   No medications on file  Previous Medications   ACETAMINOPHEN (TYLENOL) 325 MG TABLET    Take 325 mg by mouth. Take one tablet every six hours as  needed   CALCIUM CARBONATE (TUMS) 500 MG CHEWABLE TABLET    Chew 3 tablets by mouth daily. Take three tablets once daily   CHOLECALCIFEROL (VITAMIN D) 1000 UNITS CAPSULE    Take 1,000 Units by mouth. Take 2 daily   DONEPEZIL (ARICEPT) 10 MG TABLET    One daily to help preserve memory.  Modified Medications   No medications on file  Discontinued Medications   No medications on file     Physical Exam: Physical Exam  Constitutional:  overweight  HENT:  Mild loss of hearing. Nasal and central congestion.   Eyes:  Corrective lenses.  Neck: No JVD present. No thyromegaly present.  Cardiovascular: Normal rate, regular rhythm, normal heart sounds and intact distal pulses.  Exam reveals no gallop and no friction rub.   No murmur heard. Pulmonary/Chest: Effort normal and breath sounds normal. No respiratory distress. She has no wheezes. She has no rales. She exhibits no tenderness.  Abdominal: Bowel sounds are normal. She exhibits no distension and no mass. There is no tenderness.  Musculoskeletal: Normal range of motion. She exhibits no edema and no tenderness.  Lymphadenopathy:    She has no cervical adenopathy.  Neurological: She is alert. No cranial nerve deficit. Coordination normal.  Skin: No rash noted. No erythema. No pallor.  Right upper back carbuncle-I+D  Psychiatric: She has a normal mood and affect.    Filed Vitals:   06/05/13 1304  BP: 150/62  Pulse: 77  Temp: 97.6 F (36.4 C)  TempSrc: Tympanic  Resp: 18      Labs reviewed: Basic Metabolic Panel:  Recent Labs  16/10/96 04/01/13  NA  --  140  140  140  K  --  4.2  4.2  4.2  BUN  --  17  27*  27*  CREATININE  --  1.2*  1.2*  1.2*  TSH 2.92  --    Liver Function Tests:  Recent Labs  04/01/13  AST 19  19  19   ALT 20  20  20   ALKPHOS 82  82  82   CBC:  Recent Labs  07/02/12  WBC 5.9  HGB 14.7  HCT 42  PLT 244   Lipid Panel:  Recent Labs  04/01/13  CHOL 297*  HDL 54  LDLCALC  200  TRIG 214*    Assessment/Plan Furuncle of back, except buttock The right upper back, opened, dressing change by staff at [facility], Doxycycline 100mg  bid for total 10 days started-tolerated so far.   Dementia in conditions classified elsewhere with behavioral disturbance(294.11) Gradual decline, takes Aricept, prn Ativan available to her, still able to function at AL level  Abnormality of gait Ambulates with walker on unit  Carbuncle and furuncle of trunk The right upper back, opened, s/p I+D, dressing change by staff at AL [facility], Doxycycline 100mg  bid for total 10 days started-tolerated so far.      Family/ Staff Communication: observe the patient.   Goals of Care: AL  Labs/tests ordered: none

## 2013-10-09 ENCOUNTER — Encounter: Payer: Self-pay | Admitting: Internal Medicine

## 2013-10-23 ENCOUNTER — Non-Acute Institutional Stay: Payer: Medicare Other | Admitting: Internal Medicine

## 2013-10-23 VITALS — BP 144/76 | HR 60 | Temp 97.9°F | Wt 149.0 lb

## 2013-10-23 DIAGNOSIS — F0281 Dementia in other diseases classified elsewhere with behavioral disturbance: Secondary | ICD-10-CM

## 2013-10-23 DIAGNOSIS — F02818 Dementia in other diseases classified elsewhere, unspecified severity, with other behavioral disturbance: Secondary | ICD-10-CM

## 2013-10-23 DIAGNOSIS — E785 Hyperlipidemia, unspecified: Secondary | ICD-10-CM

## 2013-10-23 DIAGNOSIS — I10 Essential (primary) hypertension: Secondary | ICD-10-CM

## 2013-10-23 DIAGNOSIS — R609 Edema, unspecified: Secondary | ICD-10-CM

## 2013-10-23 NOTE — Progress Notes (Deleted)
Patient ID: [name], female   DOB: 03/08/1919, 80 y.o.   MRN: [id number]    Location:  [facility]   Place of Service: Clinic (12)  Extended Emergency Contact InformatiJoycie Peekon Primary Emergency Contact: Fussner,Ron Address: [address]          [place], [place] [address] [place] Home Phone: [phone] Relation: Son Secondary Emergency Contact: [name],[name] Address: [address]          [place], [place] [address] [place] Home Phone: [phone] Relation: Daughter   Chief Complaint  Patient presents with  . Medical Management of Chronic Issues    Comprehensive exam: blood pressure, dementia, cholesterol, edema, anxiety    HPI:  Unspecified essential hypertension  Other and unspecified hyperlipidemia  Edema  Dementia in conditions classified elsewhere with behavioral disturbance(294.11)    Past Medical History  Diagnosis Date  . Candidiasis 07/01/2012  . Blood in stool 06/29/2011  . Uterine prolapse without mention of vaginal wall prolapse 06/29/2011  . Abnormality of gait 03/02/2011  . Cholelithiasis 08/02/2010  . Memory loss 02/17/2010  . Lumbago 12/22/2009  . Personal history of fall 12/22/2009  . Disturbance of skin sensation 11/25/2009  . Unspecified vitamin D deficiency 08/19/2009  . Unspecified constipation 09/26/2007  . Other and unspecified hyperlipidemia 03/28/2007  . Pain in joint, upper arm 10/25/2006  . Abnormal mammogram, unspecified 10/25/2006  . Unspecified essential hypertension 07/19/2006  . Osteoarthrosis, unspecified whether generalized or localized, unspecified site 07/19/2006  . Edema 07/19/2006  . Allergic rhinitis due to pollen 07/13/2006  . Dizziness and giddiness 07/13/2006  . Diverticulitis of colon (without mention of hemorrhage) 10/28/2003    Past Surgical History  Procedure Laterality Date  . Cataract extraction w/ intraocular lens  implant, bilateral Bilateral 1999    History   Social History  .  Marital Status: Married    Spouse Name: N/A    Number of Children: N/A  . Years of Education: N/A   Occupational History  . retired office work    Social History Main Topics  . Smoking status: Never Smoker   . Smokeless tobacco: Never Used  . Alcohol Use: No  . Drug Use: No  . Sexual Activity: No   Other Topics Concern  . Not on file   Social History Narrative   [facility]   DNR   Living Will   Widowed husband died 2005   Walks with walker     reports that she has never smoked. She has never used smokeless tobacco. She reports that she does not drink alcohol or use illicit drugs.  Immunization History  Administered Date(s) Administered  . Influenza Whole 01/16/2012, 02/20/2013  . Pneumococcal Polysaccharide-23 06/24/1998  . Td 05/02/1974    Allergies  Allergen Reactions  . Lopid [Gemfibrozil]   . Sulfa Antibiotics   . Zocor [Simvastatin]     Medications: Patient's Medications  New Prescriptions   No medications on file  Previous Medications   ACETAMINOPHEN (TYLENOL) 325 MG TABLET    Take 325 mg by mouth. Take one tablet every six hours as needed   CALCIUM CARBONATE (TUMS) 500 MG CHEWABLE TABLET    Chew 3 tablets by mouth daily. Take three tablets once daily   CHOLECALCIFEROL (VITAMIN D) 1000 UNITS CAPSULE    Take 1,000 Units by mouth. Take 2 daily   DONEPEZIL (ARICEPT) 10 MG TABLET    One daily to help preserve memory.  LORAZEPAM (ATIVAN) 0.5 MG TABLET    Take 0.5 mg by mouth. Take one every 6 hours as needed for anxiety  Modified Medications   No medications on file  Discontinued Medications   No medications on file     Review of Systems  Constitutional: Negative for chills, diaphoresis, activity change, appetite change and fatigue.  HENT: Positive for hearing loss, postnasal drip and sneezing. Negative for congestion, ear pain, facial swelling, nosebleeds and tinnitus.   Eyes: Negative for photophobia, pain, discharge, itching  and visual disturbance.  Respiratory: Negative for cough, choking, chest tightness, shortness of breath and stridor.   Cardiovascular: Positive for leg swelling. Negative for chest pain and palpitations.  Gastrointestinal: Positive for constipation. Negative for nausea, abdominal pain, diarrhea and abdominal distention.  Endocrine: Negative.   Genitourinary:       Incontinent  Musculoskeletal: Positive for arthralgias and gait problem. Negative for back pain, joint swelling, myalgias, neck pain and neck stiffness.  Skin: Negative.   Allergic/Immunologic: Negative.   Neurological: Negative for dizziness, tremors, seizures, syncope, facial asymmetry, speech difficulty, light-headedness, numbness and headaches.  Hematological: Negative.   Psychiatric/Behavioral: Positive for behavioral problems, confusion, decreased concentration and agitation. Negative for hallucinations, sleep disturbance, self-injury and dysphoric mood. The patient is nervous/anxious. The patient is not hyperactive.     Filed Vitals:   10/23/13 1539  BP: 144/76  Pulse: 60  Temp: 97.9 F (36.6 C)  TempSrc: Oral  Weight: 149 lb (67.586 kg)  SpO2: 96%   Body mass index is 27.7 kg/(m^2).  Physical Exam  Constitutional:  overweight  HENT:  Mild loss of hearing  Eyes:  Corrective lenses.  Neck: No JVD present. No thyromegaly present.  Cardiovascular: Normal rate, regular rhythm, normal heart sounds and intact distal pulses.  Exam reveals no gallop and no friction rub.   No murmur heard. Pulmonary/Chest: Effort normal and breath sounds normal. No respiratory distress. She has no wheezes. She has no rales. She exhibits no tenderness.  Abdominal: Bowel sounds are normal. She exhibits no distension and no mass. There is no tenderness.  Musculoskeletal: Normal range of motion. She exhibits no edema and no tenderness.  Lymphadenopathy:    She has no cervical adenopathy.  Neurological: She is alert. No cranial nerve  deficit. Coordination normal.  Skin: No rash noted. No erythema. No pallor.  Scarred area of the right upper back and site of prior carbuncle.  Psychiatric: She has a normal mood and affect.     Labs reviewed: No visits with results within 3 Month(s) from this visit. Latest known visit with results is:  Nursing Home on 06/05/2013  Component Date Value Ref Range Status  . Glucose 04/01/2013 88   Final  . BUN 04/01/2013 27* 4 - 21 mg/dL Final  . Creatinine 16/01/9603 1.2* .5 - 1.1 mg/dL Final  . Potassium 54/12/8117 4.2  3.4 - 5.3 mmol/L Final  . Sodium 04/01/2013 140  137 - 147 mmol/L Final  . Alkaline Phosphatase 04/01/2013 82  25 - 125 U/L Final  . ALT 04/01/2013 20  7 - 35 U/L Final  . AST 04/01/2013 19  13 - 35 U/L Final  . Bilirubin, Total 04/01/2013 0.5   Final      Assessment/Plan

## 2013-10-23 NOTE — Progress Notes (Signed)
Failed clock drawing  

## 2013-11-12 NOTE — Progress Notes (Signed)
Patient ID: [name], female   DOB: 03/03/1919, 80 y.o.   MRN: [id number]    Location:  [facility]   Place of Service: Clinic (12)  Extended Emergency Contact Information Primary Emergency Contact: Lukin,Ron Address: [address]          [place], [place] [address] [place] Home Phone: [phone] Relation: Son Secondary Emergency Contact: [name],[name] Address: [address]          [place], [place] [address] [place] Home Phone: [phone] Relation: Daughter   Chief Complaint  Patient presents with  . Medical Management of Chronic Issues    Comprehensive exam: blood pressure, dementia, cholesterol, edema, anxiety    HPI:  Patient presents for comprehensive exam. She is feeling well. She is with her daughter-in-law Kennon RoundsSally.  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 Candidiasis 07/01/2012  . Blood in stool 06/29/2011  . Uterine prolapse without mention of vaginal wall prolapse 06/29/2011  . Abnormality of gait 03/02/2011  . Cholelithiasis 08/02/2010  . Memory loss 02/17/2010  . Lumbago 12/22/2009  . Personal history of fall 12/22/2009  . Disturbance of skin sensation 11/25/2009  . Unspecified vitamin D deficiency 08/19/2009  . Unspecified constipation 09/26/2007  . Other and unspecified hyperlipidemia 03/28/2007  . Pain in joint, upper arm 10/25/2006  . Abnormal mammogram, unspecified 10/25/2006  . Unspecified essential hypertension 07/19/2006  . Osteoarthrosis, unspecified whether generalized or localized, unspecified site 07/19/2006  . Edema 07/19/2006  . Allergic rhinitis due to pollen 07/13/2006  . Dizziness and giddiness 07/13/2006  . Diverticulitis of colon (without mention of hemorrhage) 10/28/2003    Past Surgical  History  Procedure Laterality Date  . Cataract extraction w/ intraocular lens  implant, bilateral Bilateral 1999    History   Social History  . Marital Status: Married    Spouse Name: N/A    Number of Children: N/A  . Years of Education: N/A   Occupational History  . retired office work    Social History Main Topics  . Smoking status: Never Smoker   . Smokeless tobacco: Never Used  . Alcohol Use: No  . Drug Use: No  . Sexual Activity: No   Other Topics Concern  . Not on file   Social History Narrative   [facility] [place]   DNR   Living Will   Widowed husband died 2005   Walks with walker     reports that she has never smoked. She has never used smokeless tobacco. She reports that she does not drink alcohol or use illicit drugs.  Immunization History  Administered Date(s) Administered  . Influenza Whole 01/16/2012, 02/20/2013  . Pneumococcal Polysaccharide-23 06/24/1998  . Td 05/02/1974    Allergies  Allergen Reactions  . Lopid [Gemfibrozil]   . Sulfa Antibiotics   . Zocor [Simvastatin]     Medications: Patient's Medications  New Prescriptions   No medications on file  Previous Medications   ACETAMINOPHEN (TYLENOL) 325 MG TABLET    Take 325 mg by mouth. Take one tablet every six hours as needed   CALCIUM CARBONATE (TUMS) 500 MG CHEWABLE TABLET    Chew 3 tablets by mouth daily. Take three tablets once daily   CHOLECALCIFEROL (VITAMIN D) 1000 UNITS CAPSULE    Take 1,000 Units by mouth. Take  2 daily   DONEPEZIL (ARICEPT) 10 MG TABLET    One daily to help preserve memory.   LORAZEPAM (ATIVAN) 0.5 MG TABLET    Take 0.5 mg by mouth. Take one every 6 hours as needed for anxiety  Modified Medications   No medications on file  Discontinued Medications   No medications on file     Review of Systems  Constitutional: Negative for chills, diaphoresis, activity change, appetite change and fatigue.  HENT: Positive for hearing loss, postnasal drip  and sneezing. Negative for congestion, ear pain, facial swelling, nosebleeds and tinnitus.   Eyes: Negative for photophobia, pain, discharge, itching and visual disturbance.  Respiratory: Negative for cough, choking, chest tightness, shortness of breath and stridor.   Cardiovascular: Positive for leg swelling. Negative for chest pain and palpitations.  Gastrointestinal: Positive for constipation. Negative for nausea, abdominal pain, diarrhea and abdominal distention.  Endocrine: Negative.   Genitourinary:       Incontinent  Musculoskeletal: Positive for arthralgias and gait problem. Negative for back pain, joint swelling, myalgias, neck pain and neck stiffness.  Skin: Negative.   Allergic/Immunologic: Negative.   Neurological: Negative for dizziness, tremors, seizures, syncope, facial asymmetry, speech difficulty, light-headedness, numbness and headaches.  Hematological: Negative.   Psychiatric/Behavioral: Positive for behavioral problems, confusion, decreased concentration and agitation. Negative for hallucinations, sleep disturbance, self-injury and dysphoric mood. The patient is nervous/anxious. The patient is not hyperactive.     Filed Vitals:   10/23/13 1539  BP: 144/76  Pulse: 60  Temp: 97.9 F (36.6 C)  TempSrc: Oral  Weight: 149 lb (67.586 kg)  SpO2: 96%   Body mass index is 27.7 kg/(m^2).  Physical Exam  Constitutional:  overweight  HENT:  Mild loss of hearing  Eyes:  Corrective lenses.  Neck: No JVD present. No thyromegaly present.  Cardiovascular: Normal rate, regular rhythm, normal heart sounds and intact distal pulses.  Exam reveals no gallop and no friction rub.   No murmur heard. Pulmonary/Chest: Effort normal and breath sounds normal. No respiratory distress. She has no wheezes. She has no rales. She exhibits no tenderness.  Abdominal: Bowel sounds are normal. She exhibits no distension and no mass. There is no tenderness.  Musculoskeletal: Normal range of  motion. She exhibits no edema and no tenderness.  Lymphadenopathy:    She has no cervical adenopathy.  Neurological: She is alert. No cranial nerve deficit. Coordination normal.  10/23/13 MMSE 17/30. Failed clock drawing.  Skin: No rash noted. No erythema. No pallor.  Psychiatric: She has a normal mood and affect.     Labs reviewed: No visits with results within 3 Month(s) from this visit. Latest known visit with results is:  [facility] Home on 06/05/2013  Component Date Value Ref Range Status  . Glucose 04/01/2013 88   Final  . BUN 04/01/2013 27* 4 - 21 mg/dL Final  . Creatinine 16/01/9603 1.2* .5 - 1.1 mg/dL Final  . Potassium 54/12/8117 4.2  3.4 - 5.3 mmol/L Final  . Sodium 04/01/2013 140  137 - 147 mmol/L Final  . Alkaline Phosphatase 04/01/2013 82  25 - 125 U/L Final  . ALT 04/01/2013 20  7 - 35 U/L Final  . AST 04/01/2013 19  13 - 35 U/L Final  . Bilirubin, Total 04/01/2013 0.5   Final      Assessment/Plan 1. Unspecified essential hypertension Controlled  2. Other and unspecified hyperlipidemia Controlled  3. Edema Stable to improved  4. Dementia in conditions classified elsewhere with behavioral disturbance(294.11) Unchanged. Meds  appropriate for assisted living.

## 2014-01-08 ENCOUNTER — Encounter: Payer: Self-pay | Admitting: Nurse Practitioner

## 2014-01-14 ENCOUNTER — Telehealth: Payer: Self-pay | Admitting: *Deleted

## 2014-01-14 NOTE — Telephone Encounter (Signed)
[name], son, called and stated that he wants to speak with Dr. Chilton SiGreen about medications to reduce anxiety. She is at a point were she refuses to take a bath or take her medications. She can't remember current events so she is not changing her cloths and other people try to help her and she refuses. They don't want her walking around like a zombie but they want something to help calm her. Please Advise and call son at # [phone]

## 2014-01-15 NOTE — Telephone Encounter (Signed)
Discussed situation with Mr. Dehart. Will change anxiolytic to alprazolam. [facility].

## 2014-02-05 ENCOUNTER — Encounter: Payer: Self-pay | Admitting: Nurse Practitioner

## 2014-03-05 ENCOUNTER — Non-Acute Institutional Stay: Payer: Medicare Other | Admitting: Nurse Practitioner

## 2014-03-05 ENCOUNTER — Encounter: Payer: Self-pay | Admitting: Nurse Practitioner

## 2014-03-05 VITALS — BP 124/82 | HR 60 | Wt 151.0 lb

## 2014-03-05 DIAGNOSIS — F02818 Dementia in other diseases classified elsewhere, unspecified severity, with other behavioral disturbance: Secondary | ICD-10-CM

## 2014-03-05 DIAGNOSIS — F0281 Dementia in other diseases classified elsewhere with behavioral disturbance: Secondary | ICD-10-CM

## 2014-03-05 DIAGNOSIS — E785 Hyperlipidemia, unspecified: Secondary | ICD-10-CM

## 2014-03-05 DIAGNOSIS — R269 Unspecified abnormalities of gait and mobility: Secondary | ICD-10-CM

## 2014-03-05 DIAGNOSIS — I10 Essential (primary) hypertension: Secondary | ICD-10-CM

## 2014-03-05 DIAGNOSIS — R609 Edema, unspecified: Secondary | ICD-10-CM

## 2014-03-05 NOTE — Assessment & Plan Note (Signed)
Gradual decline, takes Aricept, prn Ativan available to her, still able to function at AL level

## 2014-03-05 NOTE — Assessment & Plan Note (Signed)
Not apparent today.   

## 2014-03-05 NOTE — Progress Notes (Signed)
Patient ID: Sierra PeekSarah I Curry, female   DOB: 08/31/1918, 80 y.o.   MRN: [id number]   Code Status: DNR  Allergies  Allergen Reactions  . Lopid [Gemfibrozil]   . Sulfa Antibiotics   . Zocor [Simvastatin]     Chief Complaint  Patient presents with  . Medical Management of Chronic Issues    blood pressure, dementia, anxiety    HPI: Patient is a 80 y.o. female [facility] for evaluation of chronic medical conditions.  Problem List Items Addressed This Visit    Essential hypertension - Primary    Controlled.      Edema    Not apparent today.      Dyslipidemia    LDL 200 04/01/13-the patient desires diet control.        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  HENT: Positive for hearing loss. Negative for congestion, ear discharge, ear pain, nosebleeds, sore throat and tinnitus.   Eyes: Negative for blurred vision, double vision, photophobia, pain, discharge and redness.  Respiratory: Negative for cough, hemoptysis, sputum production, shortness of breath, wheezing and stridor.   Cardiovascular: Negative for chest pain, palpitations, orthopnea, claudication, leg swelling and PND.  Gastrointestinal: Negative for heartburn, nausea, vomiting, abdominal pain, diarrhea, constipation, blood in stool and melena.  Genitourinary: Negative for dysuria, urgency, frequency, hematuria and flank pain.  Musculoskeletal: Negative for myalgias, back pain, joint pain, falls and neck pain.       Ambulates with walker.   Skin: Negative for itching and rash.       right upper back  carbuncle-healed.   Neurological: Negative for dizziness, tingling, tremors, sensory change, speech change, focal weakness, seizures, loss of consciousness, weakness and headaches.  Endo/Heme/Allergies: Negative for environmental allergies and polydipsia. Does not bruise/bleed easily.  Psychiatric/Behavioral: Positive for memory loss. Negative for depression, suicidal ideas, hallucinations and substance abuse. The patient is not nervous/anxious and does not have insomnia.      Past Medical History  Diagnosis Date  . Candidiasis 07/01/2012  . Blood in stool 06/29/2011  . Uterine prolapse without mention of vaginal wall prolapse 06/29/2011  . Abnormality of gait 03/02/2011  . Cholelithiasis 08/02/2010  . Memory loss 02/17/2010  . Lumbago 12/22/2009  . Personal history of fall 12/22/2009  . Disturbance of skin sensation 11/25/2009  . Unspecified vitamin D deficiency 08/19/2009  . Unspecified constipation 09/26/2007  . Other and unspecified hyperlipidemia 03/28/2007  . Pain in joint, upper arm 10/25/2006  . Abnormal mammogram, unspecified 10/25/2006  . Unspecified essential hypertension 07/19/2006  . Osteoarthrosis, unspecified whether generalized or localized, unspecified site 07/19/2006  . Edema 07/19/2006  . Allergic rhinitis due to pollen 07/13/2006  . Dizziness and giddiness 07/13/2006  . Diverticulitis of colon (without mention of hemorrhage) 10/28/2003   Past Surgical History  Procedure Laterality Date  . Cataract extraction w/ intraocular lens  implant, bilateral Bilateral 1999   Social History:   reports that she has never smoked. She has never used smokeless tobacco. She reports that she does not drink alcohol or  use illicit drugs.  Family History  Problem Relation Age of Onset  . Heart disease Father     MI    Medications: Patient's Medications  New Prescriptions   No medications on file  Previous Medications   ACETAMINOPHEN (TYLENOL) 325 MG TABLET    Take 325 mg by mouth. Take one  tablet every six hours as needed   ALPRAZOLAM (XANAX) 0.25 MG TABLET    Take one each morning before bathing or changing clothes and one every 6 hours as needed for anxiety   CALCIUM CARBONATE (TUMS) 500 MG CHEWABLE TABLET    Chew 3 tablets by mouth daily. Take three tablets once daily   CHOLECALCIFEROL (VITAMIN D) 1000 UNITS CAPSULE    Take 1,000 Units by mouth. Take 2 daily   DONEPEZIL (ARICEPT) 10 MG TABLET    One daily to help preserve memory.   HYDROCODONE-ACETAMINOPHEN (NORCO/VICODIN) 5-325 MG PER TABLET    Take 1 tablet by mouth. Take one every 6 hours as needed for pain   LORAZEPAM (ATIVAN) 0.5 MG TABLET    Take 0.5 mg by mouth. Take one every 6 hours as needed for anxiety  Modified Medications   No medications on file  Discontinued Medications   No medications on file     Physical Exam: Physical Exam  Constitutional:  overweight  HENT:  Mild loss of hearing. Nasal and central congestion.   Eyes:  Corrective lenses.  Neck: No JVD present. No thyromegaly present.  Cardiovascular: Normal rate, regular rhythm, normal heart sounds and intact distal pulses.  Exam reveals no gallop and no friction rub.   No murmur heard. Pulmonary/Chest: Effort normal and breath sounds normal. No respiratory distress. She has no wheezes. She has no rales. She exhibits no tenderness.  Abdominal: Bowel sounds are normal. She exhibits no distension and no mass. There is no tenderness.  Musculoskeletal: Normal range of motion. She exhibits no edema or tenderness.  Ambulates on unit with walker.   Lymphadenopathy:    She has no cervical adenopathy.  Neurological: She is alert. No cranial nerve deficit. Coordination normal.  Skin: No rash noted. No erythema. No pallor.  Psychiatric: She has a normal mood and affect.    Filed Vitals:   03/05/14 1638  BP: 124/82  Pulse: 60  Weight: 151 lb (68.493 kg)      Labs reviewed: Basic Metabolic Panel:  Recent Labs  16/01/9611/16/14  NA 140  140  140  K  4.2  4.2  4.2  BUN 27*  27*  17  CREATININE 1.2*  1.2*  1.2*   Liver Function Tests:  Recent Labs  04/01/13  AST 19  19  19   ALT 20  20  20   ALKPHOS 82  82  82   CBC: No results for input(s): WBC, NEUTROABS, HGB, HCT, MCV, PLT in the last 8760 hours. Lipid Panel:  Recent Labs  04/01/13  CHOL 297*  HDL 54  LDLCALC 200  TRIG [id number]*    Assessment/Plan Essential hypertension Controlled.    Abnormality of gait Ambulates with walker on unit   Dementia due to medical condition with behavioral disturbance Gradual decline, takes Aricept, prn Ativan available to her, still able to function at AL level   Dyslipidemia LDL 200 04/01/13-the patient desires diet control.      Edema Not apparent today.      Family/ Staff Communication: observe the patient.   Goals of Care: AL  Labs/tests ordered: none

## 2014-03-05 NOTE — Assessment & Plan Note (Signed)
Ambulates with walker on unit.  

## 2014-03-05 NOTE — Assessment & Plan Note (Signed)
LDL 200 04/01/13-the patient desires diet control.

## 2014-03-05 NOTE — Assessment & Plan Note (Signed)
Controlled.  

## 2014-03-10 LAB — HEPATIC FUNCTION PANEL
ALT: 16 U/L (ref 7–35)
AST: 22 U/L (ref 13–35)
Alkaline Phosphatase: 103 U/L (ref 25–125)
BILIRUBIN, TOTAL: 0.6 mg/dL

## 2014-03-10 LAB — CBC AND DIFFERENTIAL
HEMATOCRIT: 45 % (ref 36–46)
HEMOGLOBIN: 15.9 g/dL (ref 12.0–16.0)
Platelets: 259 10*3/uL (ref 150–399)
WBC: 8.2 10*3/mL

## 2014-03-10 LAB — HEMOGLOBIN A1C: Hgb A1c MFr Bld: 5.7 % (ref 4.0–6.0)

## 2014-03-10 LAB — BASIC METABOLIC PANEL
BUN: 16 mg/dL (ref 4–21)
Creatinine: 1 mg/dL (ref 0.5–1.1)
Glucose: 90 mg/dL
POTASSIUM: 4.4 mmol/L (ref 3.4–5.3)
SODIUM: 137 mmol/L (ref 137–147)

## 2014-03-10 LAB — TSH: TSH: 2.21 u[IU]/mL (ref 0.41–5.90)

## 2014-03-11 ENCOUNTER — Other Ambulatory Visit: Payer: Self-pay | Admitting: Nurse Practitioner

## 2014-03-26 ENCOUNTER — Other Ambulatory Visit: Payer: Self-pay | Admitting: Nurse Practitioner

## 2014-04-13 ENCOUNTER — Other Ambulatory Visit: Payer: Self-pay | Admitting: *Deleted

## 2014-04-13 MED ORDER — ALPRAZOLAM 0.25 MG PO TABS: 0.2500 mg | ORAL_TABLET | Freq: Three times a day (TID) | ORAL | Status: DC | PRN

## 2014-04-23 ENCOUNTER — Encounter: Payer: Self-pay | Admitting: Internal Medicine

## 2014-05-21 ENCOUNTER — Encounter: Payer: Self-pay | Admitting: Internal Medicine

## 2014-05-21 ENCOUNTER — Non-Acute Institutional Stay: Payer: Medicare Other | Admitting: Internal Medicine

## 2014-05-21 VITALS — BP 122/64 | HR 68 | Temp 97.7°F | Wt 142.0 lb

## 2014-05-21 DIAGNOSIS — F0281 Dementia in other diseases classified elsewhere with behavioral disturbance: Secondary | ICD-10-CM

## 2014-05-21 DIAGNOSIS — W19XXXA Unspecified fall, initial encounter: Secondary | ICD-10-CM | POA: Insufficient documentation

## 2014-05-21 DIAGNOSIS — F02818 Dementia in other diseases classified elsewhere, unspecified severity, with other behavioral disturbance: Secondary | ICD-10-CM

## 2014-05-21 DIAGNOSIS — R609 Edema, unspecified: Secondary | ICD-10-CM

## 2014-05-21 DIAGNOSIS — I10 Essential (primary) hypertension: Secondary | ICD-10-CM

## 2014-05-21 DIAGNOSIS — R269 Unspecified abnormalities of gait and mobility: Secondary | ICD-10-CM

## 2014-05-21 NOTE — Progress Notes (Signed)
Patient ID: [name], female   DOB: Aug 21, 1918, 81 y.o.   MRN: [id number]    [facility]  Place of Service: Clinic (12)   Allergies  Allergen Reactions  . Lopid [Gemfibrozil]   . Sulfa Antibiotics   . Zocor [Simvastatin]     Chief Complaint  Patient presents with  . Medical Management of Chronic Issues    blood pressure, dementia, anxiety. Here with daughter-in-law Kennon Rounds  . Fall    last week, has a knot over left eye    HPI:  Fall, initial encounter: Larey Seat last week. Hematoma and bruising over left forehead. No change in vision. Denies headache or pain. Had another non-injurious fall several weeks ago.  Abnormality of gait: unstable. Using 4 wheel walker. Family says she is having a harder time using the walker. Weaker in quadriceps bilaterally.  Essential hypertension: controlled  Edema: resolved  Dementia due to medical condition with behavioral disturbance: slow worsening. Prior behavioral issues have calmed down.    Medications: Patient's Medications  New Prescriptions   No medications on file  Previous Medications   ACETAMINOPHEN (TYLENOL) 325 MG TABLET    Take 325 mg by mouth. Take one tablet every six hours as needed   ALPRAZOLAM (XANAX) 0.25 MG TABLET    Take 1 tablet (0.25 mg total) by mouth 3 (three) times daily as needed for anxiety. Take one each morning before bathing or changing clothes and one every 6 hours as needed for anxiety   CALCIUM CARBONATE (TUMS) 500 MG CHEWABLE TABLET    Chew 3 tablets by mouth daily. Take three tablets once daily   CHOLECALCIFEROL (VITAMIN D) 1000 UNITS CAPSULE    Take 1,000 Units by mouth. Take 2 daily   DONEPEZIL (ARICEPT) 10 MG TABLET    One daily to help preserve memory.   LORAZEPAM (ATIVAN) 0.5 MG TABLET    Take 0.5 mg by mouth. Take one every 6 hours as needed for anxiety  Modified Medications   No medications on file  Discontinued Medications   No medications on file      Review of Systems  Constitutional: Negative for chills, diaphoresis, activity change, appetite change and fatigue.  HENT: Positive for hearing loss. Negative for congestion, ear pain, facial swelling, nosebleeds, postnasal drip, sneezing and tinnitus.   Eyes: Negative for photophobia, pain, discharge, itching and visual disturbance.  Respiratory: Negative for cough, choking, chest tightness, shortness of breath and stridor.   Cardiovascular: Negative for chest pain, palpitations and leg swelling.  Gastrointestinal: Positive for constipation. Negative for nausea, abdominal pain, diarrhea and abdominal distention.  Endocrine: Negative.   Genitourinary:       Incontinent  Musculoskeletal: Positive for arthralgias and gait problem. Negative for myalgias, back pain, joint swelling, neck pain and neck stiffness.  Skin:       Bruising of the left side of the forehead.  Allergic/Immunologic: Negative.   Neurological: Negative for dizziness, tremors, seizures, syncope, facial asymmetry, speech difficulty, light-headedness, numbness and headaches.  Hematological: Negative.   Psychiatric/Behavioral: Positive for behavioral problems, confusion, decreased concentration and agitation. Negative for hallucinations, sleep disturbance, self-injury and dysphoric mood. The patient is nervous/anxious. The patient is not hyperactive.     Filed Vitals:   05/21/14 1404  BP: 122/64  Pulse: 68  Temp: 97.7 F (36.5 C)  TempSrc: Oral  Weight: 142 lb (64.411 kg)   Body mass index is 26.4 kg/(m^2).  Physical Exam  Constitutional:  overweight  HENT:  Mild loss of hearing  Eyes:  Corrective lenses.  Neck: No JVD present. No thyromegaly present.  Cardiovascular: Normal rate, regular rhythm, normal heart sounds and intact distal pulses.  Exam reveals no gallop and no friction rub.   No murmur heard. Pulmonary/Chest: Effort normal and breath sounds normal. No respiratory distress. She has no wheezes.  She has no rales. She exhibits no tenderness.  Abdominal: Bowel sounds are normal. She exhibits no distension and no mass. There is no tenderness.  Musculoskeletal: Normal range of motion. She exhibits no edema or tenderness.  Unstable gait; using 4 wheel walker.  Lymphadenopathy:    She has no cervical adenopathy.  Neurological: She is alert. No cranial nerve deficit. Coordination normal.  10/23/13 MMSE 17/30. Failed clock drawing.  Skin: No rash noted. No erythema. No pallor.  Psychiatric: She has a normal mood and affect.     Labs reviewed: Lab on 03/11/2014  Component Date Value Ref Range Status  . Hemoglobin 03/10/2014 15.9  12.0 - 16.0 g/dL Final  . HCT 69/62/952811/24/2015 45  36 - 46 % Final  . Platelets 03/10/2014 259  150 - 399 K/L Final  . WBC 03/10/2014 8.2   Final  . Glucose 03/10/2014 90   Final  . BUN 03/10/2014 16  4 - 21 mg/dL Final  . Creatinine 41/32/440111/24/2015 1.0  0.5 - 1.1 mg/dL Final  . Potassium 02/72/536611/24/2015 4.4  3.4 - 5.3 mmol/L Final  . Sodium 03/10/2014 137  137 - 147 mmol/L Final  . Alkaline Phosphatase 03/10/2014 103  25 - 125 U/L Final  . ALT 03/10/2014 16  7 - 35 U/L Final  . AST 03/10/2014 22  13 - 35 U/L Final  . Bilirubin, Total 03/10/2014 0.6   Final  . TSH 03/10/2014 2.21  0.41 - 5.90 uIU/mL Final  . Hgb A1c MFr Bld 03/10/2014 5.7  4.0 - 6.0 % Final     Assessment/Plan  1. Fall, initial encounter At high risk for future falling  2. Abnormality of gait Continue walker  3. Essential hypertension controlled  4. Edema resolved  5. Dementia due to medical condition with behavioral disturbance Improved behavior. Remains anxious. Continue donepezil and alprazolam.

## 2014-06-03 ENCOUNTER — Encounter: Payer: Self-pay | Admitting: Nurse Practitioner

## 2014-06-03 ENCOUNTER — Non-Acute Institutional Stay: Payer: Medicare Other | Admitting: Nurse Practitioner

## 2014-06-03 DIAGNOSIS — F02818 Dementia in other diseases classified elsewhere, unspecified severity, with other behavioral disturbance: Secondary | ICD-10-CM

## 2014-06-03 DIAGNOSIS — R609 Edema, unspecified: Secondary | ICD-10-CM | POA: Diagnosis not present

## 2014-06-03 DIAGNOSIS — J209 Acute bronchitis, unspecified: Secondary | ICD-10-CM | POA: Insufficient documentation

## 2014-06-03 DIAGNOSIS — F0281 Dementia in other diseases classified elsewhere with behavioral disturbance: Secondary | ICD-10-CM

## 2014-06-03 NOTE — Assessment & Plan Note (Signed)
06/03/14 CXR no acute cardiopulmonary abnormality-Levaquin 250mg  qd x 10days, Mucinex 600mg  bid x 5 days, DuoNeb q6h x 3 days then prn x 1 wk. Medrol dose pk. Observe.

## 2014-06-03 NOTE — Progress Notes (Signed)
Patient ID: [name], female   DOB: 10/14/1918, 81 y.o.   MRN: [id number]   Code Status: DNR  Allergies  Allergen Reactions  . Lopid [Gemfibrozil]   . Sulfa Antibiotics   . Zocor [Simvastatin]     Chief Complaint  Patient presents with  . Medical Management of Chronic Issues  . Acute Visit    congestive cough    HPI: Patient is a 81 y.o. female [facility] today for evaluation of congestive cough and chronic medical conditions.  Problem List Items Addressed This Visit    Edema    Not apparent today.       Dementia due to medical condition with behavioral disturbance    Gradual decline, takes Aricept, prn Ativan available to her, still able to function at AL level        Acute bronchitis - Primary    06/03/14 CXR no acute cardiopulmonary abnormality-Levaquin 250mg  qd x 10days, Mucinex 600mg  bid x 5 days, DuoNeb q6h x 3 days then prn x 1 wk. Medrol dose pk. Observe.           Review of Systems:  Review of Systems  Constitutional: Positive for malaise/fatigue. Negative for fever, chills, weight loss and diaphoresis.  HENT: Positive for hearing loss. Negative for congestion, ear discharge, ear pain, nosebleeds, sore throat and tinnitus.   Eyes: Negative for blurred vision, double vision, photophobia, pain, discharge and redness.  Respiratory: Positive for cough. Negative for hemoptysis, sputum production, shortness of breath, wheezing and stridor.   Cardiovascular: Negative for chest pain, palpitations, orthopnea, claudication, leg swelling and PND.  Gastrointestinal: Negative for heartburn, nausea, vomiting, abdominal pain, diarrhea, constipation, blood in stool and melena.  Genitourinary: Positive for frequency. Negative for dysuria, urgency, hematuria and flank pain.  Musculoskeletal: Positive for falls. Negative for myalgias, back pain, joint pain and neck pain.  Skin: Negative for itching and rash.  Neurological: Negative for  dizziness, tingling, tremors, sensory change, speech change, focal weakness, seizures, loss of consciousness, weakness and headaches.  Endo/Heme/Allergies: Negative for environmental allergies and polydipsia. Does not bruise/bleed easily.  Psychiatric/Behavioral: Positive for memory loss. Negative for depression, suicidal ideas, hallucinations and substance abuse. The patient is not nervous/anxious and does not have insomnia.      Past Medical History  Diagnosis Date  . Candidiasis 07/01/2012  . Blood in stool 06/29/2011  . Uterine prolapse without mention of vaginal wall prolapse 06/29/2011  . Abnormality of gait 03/02/2011  . Cholelithiasis 08/02/2010  . Memory loss 02/17/2010  . Lumbago 12/22/2009  . Personal history of fall 12/22/2009  . Disturbance of skin sensation 11/25/2009  . Unspecified vitamin D deficiency 08/19/2009  . Unspecified constipation 09/26/2007  . Other and unspecified hyperlipidemia 03/28/2007  . Pain in joint, upper arm 10/25/2006  . Abnormal mammogram, unspecified 10/25/2006  . Unspecified essential hypertension 07/19/2006  . Osteoarthrosis, unspecified whether generalized or localized, unspecified site 07/19/2006  . Edema 07/19/2006  . Allergic rhinitis due to pollen 07/13/2006  . Dizziness and giddiness 07/13/2006  . Diverticulitis of colon (without mention of hemorrhage) 10/28/2003   Past Surgical History  Procedure Laterality Date  . Cataract extraction w/ intraocular lens  implant, bilateral Bilateral 1999   Social History:   reports that she has never smoked. She has never used smokeless tobacco. She reports that she does not drink alcohol or use illicit drugs.  Family History  Problem Relation Age of Onset  . Heart disease Father  MI    Medications: Patient's Medications  New Prescriptions   No medications on file  Previous Medications   ACETAMINOPHEN (TYLENOL) 325 MG TABLET    Take 325 mg by mouth. Take one tablet every six hours as needed   ALPRAZOLAM  (XANAX) 0.25 MG TABLET    Take 0.25 mg by mouth. Take one each morning before bathing or changing clothes and one every 6 hours as needed for anxiety.   CALCIUM CARBONATE (TUMS) 500 MG CHEWABLE TABLET    Chew 3 tablets by mouth daily. Take three tablets once daily   CHOLECALCIFEROL (VITAMIN D) 1000 UNITS CAPSULE    Take 1,000 Units by mouth. Take 2 daily   DONEPEZIL (ARICEPT) 10 MG TABLET    One daily to help preserve memory.  Modified Medications   No medications on file  Discontinued Medications   No medications on file     Physical Exam: Physical Exam  Constitutional:  overweight  HENT:  Mild loss of hearing. Nasal and central congestion.   Eyes:  Corrective lenses.  Neck: No JVD present. No thyromegaly present.  Cardiovascular: Normal rate, regular rhythm, normal heart sounds and intact distal pulses.  Exam reveals no gallop and no friction rub.   No murmur heard. Pulmonary/Chest: Effort normal and breath sounds normal. No respiratory distress. She has no wheezes. She has no rales. She exhibits no tenderness.  Abdominal: Bowel sounds are normal. She exhibits no distension and no mass. There is no tenderness.  Musculoskeletal: Normal range of motion. She exhibits no edema or tenderness.  Ambulates on unit with walker.   Lymphadenopathy:    She has no cervical adenopathy.  Neurological: She is alert. No cranial nerve deficit. Coordination normal.  Skin: No rash noted. No erythema. No pallor.  Psychiatric: She has a normal mood and affect.    Filed Vitals:   06/04/14 1655  BP: 130/80  Pulse: 76  Temp: 97.9 F (36.6 C)  TempSrc: Tympanic  Resp: 18      Labs reviewed: Basic Metabolic Panel:  Recent Labs  16/10/96  NA 137  K 4.4  BUN 16  CREATININE 1.0  TSH 2.21   Liver Function Tests:  Recent Labs  03/10/14  AST 22  ALT 16  ALKPHOS 103   CBC:  Recent Labs  03/10/14  WBC 8.2  HGB 15.9  HCT 45  PLT 259   Lipid Panel: No results for input(s):  CHOL, HDL, LDLCALC, TRIG, CHOLHDL, LDLDIRECT in the last 8760 hours.   06/03/14 CXR  No acute cardiopulmonary abnormality.   Assessment/Plan Acute bronchitis 06/03/14 CXR no acute cardiopulmonary abnormality-Levaquin  qd x 10days, Mucinex  bid x 5 days, DuoNeb q6h x 3 days then prn x 1 wk. Medrol dose pk. Observe.     Edema Not apparent today.    Dementia due to medical condition with behavioral disturbance Gradual decline, takes Aricept, prn Ativan available to her, still able to function at AL level       Family/ Staff Communication: observe the patient.   Goals of Care: AL  Labs/tests ordered: none

## 2014-06-04 NOTE — Assessment & Plan Note (Signed)
Not apparent today.   

## 2014-06-04 NOTE — Assessment & Plan Note (Signed)
Gradual decline, takes Aricept, prn Ativan available to her, still able to function at AL level  

## 2014-07-01 ENCOUNTER — Other Ambulatory Visit: Payer: Self-pay

## 2014-07-01 MED ORDER — ALPRAZOLAM 0.25 MG PO TABS: ORAL_TABLET | ORAL | Status: DC

## 2014-07-01 NOTE — Telephone Encounter (Signed)
RX sent to [facility] pharmacy @ [phone], phone number [phone]. This is a nursing home refill request.   

## 2014-08-13 ENCOUNTER — Non-Acute Institutional Stay: Payer: Medicare Other | Admitting: Nurse Practitioner

## 2014-08-13 ENCOUNTER — Encounter: Payer: Self-pay | Admitting: Nurse Practitioner

## 2014-08-13 DIAGNOSIS — N390 Urinary tract infection, site not specified: Secondary | ICD-10-CM

## 2014-08-13 DIAGNOSIS — R609 Edema, unspecified: Secondary | ICD-10-CM | POA: Diagnosis not present

## 2014-08-13 DIAGNOSIS — R319 Hematuria, unspecified: Secondary | ICD-10-CM

## 2014-08-13 DIAGNOSIS — I10 Essential (primary) hypertension: Secondary | ICD-10-CM

## 2014-08-13 DIAGNOSIS — F0281 Dementia in other diseases classified elsewhere with behavioral disturbance: Secondary | ICD-10-CM | POA: Diagnosis not present

## 2014-08-13 DIAGNOSIS — F02818 Dementia in other diseases classified elsewhere, unspecified severity, with other behavioral disturbance: Secondary | ICD-10-CM

## 2014-08-13 NOTE — Progress Notes (Signed)
Patient ID: [name], female   DOB: 12/30/1918, 81 y.o.   MRN: [id number]   Code Status: DNR  Allergies  Allergen Reactions  . Lopid [Gemfibrozil]   . Sulfa Antibiotics   . Zocor [Simvastatin]     Chief Complaint  Patient presents with  . Medical Management of Chronic Issues  . Acute Visit    UTI    HPI: Patient is a 81 y.o. female seen in the [facility] at [facility] today for evaluation of UTI and other chronic medical conditions.  Problem List Items Addressed This Visit    Essential hypertension    Controlled.        Edema    Not apparent today.       Dementia due to medical condition with behavioral disturbance    Gradual decline, takes Aricept, prn Ativan available to her, still able to function at AL level         UTI (urinary tract infection) - Primary    08/11/14 urine culture Viridans 7 day course of Augmentin 875mg  bid started 08/12/14          Review of Systems:  Review of Systems  Constitutional: Negative for fever, chills and diaphoresis.  HENT: Positive for hearing loss. Negative for congestion, ear discharge, ear pain, nosebleeds, sore throat and tinnitus.   Eyes: Negative for photophobia, pain, discharge and redness.  Respiratory: Positive for cough. Negative for shortness of breath, wheezing and stridor.   Cardiovascular: Negative for chest pain, palpitations and leg swelling.  Gastrointestinal: Negative for nausea, vomiting, abdominal pain, diarrhea, constipation and blood in stool.  Endocrine: Negative for polydipsia.  Genitourinary: Positive for frequency. Negative for dysuria, urgency, hematuria and flank pain.  Musculoskeletal: Negative for myalgias, back pain and neck pain.  Skin: Negative for rash.  Allergic/Immunologic: Negative for environmental allergies.  Neurological: Negative for dizziness, tremors, seizures, weakness and headaches.  Hematological: Does not bruise/bleed easily.  Psychiatric/Behavioral: Negative for  suicidal ideas and hallucinations. The patient is not nervous/anxious.     Past Medical History  Diagnosis Date  . Candidiasis 07/01/2012  . Blood in stool 06/29/2011  . Uterine prolapse without mention of vaginal wall prolapse 06/29/2011  . Abnormality of gait 03/02/2011  . Cholelithiasis 08/02/2010  . Memory loss 02/17/2010  . Lumbago 12/22/2009  . Personal history of fall 12/22/2009  . Disturbance of skin sensation 11/25/2009  . Unspecified vitamin D deficiency 08/19/2009  . Unspecified constipation 09/26/2007  . Other and unspecified hyperlipidemia 03/28/2007  . Pain in joint, upper arm 10/25/2006  . Abnormal mammogram, unspecified 10/25/2006  . Unspecified essential hypertension 07/19/2006  . Osteoarthrosis, unspecified whether generalized or localized, unspecified site 07/19/2006  . Edema 07/19/2006  . Allergic rhinitis due to pollen 07/13/2006  . Dizziness and giddiness 07/13/2006  . Diverticulitis of colon (without mention of hemorrhage) 10/28/2003   Past Surgical History  Procedure Laterality Date  . Cataract extraction w/ intraocular lens  implant, bilateral Bilateral 1999   Social History:   reports that she has never smoked. She has never used smokeless tobacco. She reports that she does not drink alcohol or use illicit drugs.  Family History  Problem Relation Age of Onset  . Heart disease Father     MI    Medications: Patient's Medications  New Prescriptions   No medications on file  Previous Medications   ACETAMINOPHEN (TYLENOL) 325 MG TABLET    Take 325 mg by mouth. Take one tablet every six hours as needed  ALPRAZOLAM (XANAX) 0.25 MG TABLET    1 by mouth three times daily as needed for anxiety and take 1 by mouth every morning before bathing or changing clothes and take 1 by mouth every 6 hours as needed for anxiety   CALCIUM CARBONATE (TUMS) 500 MG CHEWABLE TABLET    Chew 3 tablets by mouth daily. Take three tablets once daily   CHOLECALCIFEROL (VITAMIN D) 1000 UNITS CAPSULE     Take 1,000 Units by mouth. Take 2 daily   DONEPEZIL (ARICEPT) 10 MG TABLET    One daily to help preserve memory.  Modified Medications   No medications on file  Discontinued Medications   No medications on file     Physical Exam: Physical Exam  Constitutional:  overweight  HENT:  Mild loss of hearing. Nasal and central congestion.   Eyes:  Corrective lenses.  Neck: No JVD present. No thyromegaly present.  Cardiovascular: Normal rate, regular rhythm, normal heart sounds and intact distal pulses.  Exam reveals no gallop and no friction rub.   No murmur heard. Pulmonary/Chest: Effort normal and breath sounds normal. No respiratory distress. She has no wheezes. She has no rales. She exhibits no tenderness.  Abdominal: Bowel sounds are normal. She exhibits no distension and no mass. There is no tenderness.  Musculoskeletal: Normal range of motion. She exhibits no edema or tenderness.  Ambulates on unit with walker.   Lymphadenopathy:    She has no cervical adenopathy.  Neurological: She is alert. No cranial nerve deficit. Coordination normal.  Skin: No rash noted. No erythema. No pallor.  Psychiatric: She has a normal mood and affect.    Filed Vitals:   08/13/14 1445  BP: 118/58  Pulse: 60  Temp: 97.6 F (36.4 C)  TempSrc: Tympanic  Resp: 16      Labs reviewed: Basic Metabolic Panel:  Recent Labs  11/91/47  NA 137  K 4.4  BUN 16  CREATININE 1.0  TSH 2.21   Liver Function Tests:  Recent Labs  03/10/14  AST 22  ALT 16  ALKPHOS 103   No results for input(s): LIPASE, AMYLASE in the last 8760 hours. No results for input(s): AMMONIA in the last 8760 hours. CBC:  Recent Labs  03/10/14  WBC 8.2  HGB 15.9  HCT 45  PLT 259   Lipid Panel: No results for input(s): CHOL, HDL, LDLCALC, TRIG, CHOLHDL, LDLDIRECT in the last 8760 hours.   Past Procedures:  06/03/14 CXR  No acute cardiopulmonary abnormality  Assessment/Plan UTI (urinary tract  infection) 08/11/14 urine culture Viridans 7 day course of Augmentin  bid started 08/12/14    Dementia due to medical condition with behavioral disturbance Gradual decline, takes Aricept, prn Ativan available to her, still able to function at AL level      Essential hypertension Controlled.     Edema Not apparent today.      Family/ Staff Communication: observe the patient.   Goals of Care: AL  Labs/tests ordered: none

## 2014-08-13 NOTE — Assessment & Plan Note (Signed)
Not apparent today.   

## 2014-08-13 NOTE — Assessment & Plan Note (Signed)
Gradual decline, takes Aricept, prn Ativan available to her, still able to function at AL level  

## 2014-08-13 NOTE — Assessment & Plan Note (Signed)
08/11/14 urine culture Viridans 7 day course of Augmentin 875mg  bid started 08/12/14

## 2014-08-13 NOTE — Assessment & Plan Note (Signed)
Controlled.  

## 2014-10-23 ENCOUNTER — Encounter: Payer: Self-pay | Admitting: Nurse Practitioner

## 2014-10-23 ENCOUNTER — Non-Acute Institutional Stay: Payer: Medicare Other | Admitting: Nurse Practitioner

## 2014-10-23 DIAGNOSIS — R269 Unspecified abnormalities of gait and mobility: Secondary | ICD-10-CM

## 2014-10-23 DIAGNOSIS — F0281 Dementia in other diseases classified elsewhere with behavioral disturbance: Secondary | ICD-10-CM

## 2014-10-23 DIAGNOSIS — R609 Edema, unspecified: Secondary | ICD-10-CM

## 2014-10-23 DIAGNOSIS — I10 Essential (primary) hypertension: Secondary | ICD-10-CM

## 2014-10-23 DIAGNOSIS — F02818 Dementia in other diseases classified elsewhere, unspecified severity, with other behavioral disturbance: Secondary | ICD-10-CM

## 2014-10-23 LAB — BASIC METABOLIC PANEL
BUN: 22 mg/dL — AB (ref 4–21)
Creatinine: 0.9 mg/dL (ref 0.5–1.1)
Glucose: 83 mg/dL
Potassium: 4.3 mmol/L (ref 3.4–5.3)
Sodium: 138 mmol/L (ref 137–147)

## 2014-10-23 LAB — HEPATIC FUNCTION PANEL
ALK PHOS: 101 U/L (ref 25–125)
ALT: 11 U/L (ref 7–35)
AST: 15 U/L (ref 13–35)
BILIRUBIN, TOTAL: 0.5 mg/dL

## 2014-10-23 LAB — CBC AND DIFFERENTIAL
HEMATOCRIT: 39 % (ref 36–46)
HEMOGLOBIN: 13.2 g/dL (ref 12.0–16.0)
Platelets: 249 10*3/uL (ref 150–399)
WBC: 7.1 10^3/mL

## 2014-10-23 LAB — TSH: TSH: 1.94 u[IU]/mL (ref 0.41–5.90)

## 2014-10-23 NOTE — Assessment & Plan Note (Signed)
Not apparent today.

## 2014-10-23 NOTE — Assessment & Plan Note (Signed)
Controlled.  

## 2014-10-23 NOTE — Progress Notes (Signed)
Patient ID: [name], female   DOB: 02/28/1919, 81 y.o.   MRN: [id number]   Code Status: DNR  Allergies  Allergen Reactions  . Lopid [Gemfibrozil]   . Sulfa Antibiotics   . Zocor [Simvastatin]     Chief Complaint  Patient presents with  . Medical Management of Chronic Issues    HPI: Patient is a 81 y.o. female [facility] today for evaluation of chronic medical conditions.  Problem List Items Addressed This Visit    Abnormality of gait    Able to ambulates with walker on unit.       Essential hypertension    Controlled.        Edema    Not apparent today.        Dementia due to medical condition with behavioral disturbance - Primary    Gradual decline, takes Aricept, Alprazolam 0.25mg  qam and q6h prn available to her, still able to function at AL level           Review of Systems:  Review of Systems  Constitutional: Negative for fever, chills and diaphoresis.  HENT: Positive for hearing loss. Negative for congestion, ear discharge, ear pain, nosebleeds, sore throat and tinnitus.   Eyes: Negative for photophobia, pain, discharge and redness.  Respiratory: Positive for cough. Negative for shortness of breath, wheezing and stridor.   Cardiovascular: Negative for chest pain, palpitations and leg swelling.  Gastrointestinal: Negative for nausea, vomiting, abdominal pain, diarrhea, constipation and blood in stool.  Endocrine: Negative for polydipsia.  Genitourinary: Positive for frequency. Negative for dysuria, urgency, hematuria and flank pain.  Musculoskeletal: Negative for myalgias, back pain and neck pain.  Skin: Negative for rash.  Allergic/Immunologic: Negative for environmental allergies.  Neurological: Negative for dizziness, tremors, seizures, weakness and headaches.  Hematological: Does not bruise/bleed easily.  Psychiatric/Behavioral: Negative for suicidal ideas and hallucinations. The patient is not nervous/anxious.       Past Medical History  Diagnosis Date  . Candidiasis 07/01/2012  . Blood in stool 06/29/2011  . Uterine prolapse without mention of vaginal wall prolapse 06/29/2011  . Abnormality of gait 03/02/2011  . Cholelithiasis 08/02/2010  . Memory loss 02/17/2010  . Lumbago 12/22/2009  . Personal history of fall 12/22/2009  . Disturbance of skin sensation 11/25/2009  . Unspecified vitamin D deficiency 08/19/2009  . Unspecified constipation 09/26/2007  . Other and unspecified hyperlipidemia 03/28/2007  . Pain in joint, upper arm 10/25/2006  . Abnormal mammogram, unspecified 10/25/2006  . Unspecified essential hypertension 07/19/2006  . Osteoarthrosis, unspecified whether generalized or localized, unspecified site 07/19/2006  . Edema 07/19/2006  . Allergic rhinitis due to pollen 07/13/2006  . Dizziness and giddiness 07/13/2006  . Diverticulitis of colon (without mention of hemorrhage) 10/28/2003   Past Surgical History  Procedure Laterality Date  . Cataract extraction w/ intraocular lens  implant, bilateral Bilateral 1999   Social History:   reports that she has never smoked. She has never used smokeless tobacco. She reports that she does not drink alcohol or use illicit drugs.  Family History  Problem Relation Age of Onset  . Heart disease Father     MI    Medications: Patient's Medications  New Prescriptions   No medications on file  Previous Medications   ACETAMINOPHEN (TYLENOL) 325 MG TABLET    Take 325 mg by mouth. Take one tablet every six hours as needed   ALPRAZOLAM (XANAX) 0.25 MG TABLET    1 by mouth three times  daily as needed for anxiety and take 1 by mouth every morning before bathing or changing clothes and take 1 by mouth every 6 hours as needed for anxiety   CALCIUM CARBONATE (TUMS) 500 MG CHEWABLE TABLET    Chew 3 tablets by mouth daily. Take three tablets once daily   CHOLECALCIFEROL (VITAMIN D) 1000 UNITS CAPSULE    Take 1,000 Units by mouth. Take 2 daily   DONEPEZIL (ARICEPT) 10 MG  TABLET    One daily to help preserve memory.  Modified Medications   No medications on file  Discontinued Medications   No medications on file     Physical Exam: Physical Exam  Constitutional:  overweight  HENT:  Mild loss of hearing. Nasal and central congestion.   Eyes:  Corrective lenses.  Neck: No JVD present. No thyromegaly present.  Cardiovascular: Normal rate, regular rhythm, normal heart sounds and intact distal pulses.  Exam reveals no gallop and no friction rub.   No murmur heard. Pulmonary/Chest: Effort normal and breath sounds normal. No respiratory distress. She has no wheezes. She has no rales. She exhibits no tenderness.  Abdominal: Bowel sounds are normal. She exhibits no distension and no mass. There is no tenderness.  Musculoskeletal: Normal range of motion. She exhibits no edema or tenderness.  Ambulates on unit with walker.   Lymphadenopathy:    She has no cervical adenopathy.  Neurological: She is alert. No cranial nerve deficit. Coordination normal.  Skin: No rash noted. No erythema. No pallor.  Psychiatric: She has a normal mood and affect.    Filed Vitals:   10/23/14 1440  BP: 126/64  Pulse: 61  Temp: 97.1 F (36.2 C)  TempSrc: Tympanic  Resp: 16      Labs reviewed: Basic Metabolic Panel:  Recent Labs  40/98/11 10/23/14  NA 137 138  K 4.4 4.3  BUN 16 22*  CREATININE 1.0 0.9  TSH 2.21 1.94   Liver Function Tests:  Recent Labs  03/10/14 10/23/14  AST 22 15  ALT 16 11  ALKPHOS 103 101   No results for input(s): LIPASE, AMYLASE in the last 8760 hours. No results for input(s): AMMONIA in the last 8760 hours. CBC:  Recent Labs  03/10/14 10/23/14  WBC 8.2 7.1  HGB 15.9 13.2  HCT 45 39  PLT 259 249   Lipid Panel: No results for input(s): CHOL, HDL, LDLCALC, TRIG, CHOLHDL, LDLDIRECT in the last 8760 hours.   Past Procedures:  06/03/14 CXR  No acute cardiopulmonary abnormality  Assessment/Plan Dementia due to medical  condition with behavioral disturbance Gradual decline, takes Aricept, Alprazolam 0.25mg  qam and q6h prn available to her, still able to function at AL level    Abnormality of gait Able to ambulates with walker on unit.   Essential hypertension Controlled.    Edema Not apparent today.      Family/ Staff Communication: observe the patient.   Goals of Care: AL  Labs/tests ordered: CBC, CMP, TSH

## 2014-10-23 NOTE — Assessment & Plan Note (Signed)
Gradual decline, takes Aricept, Alprazolam 0.25mg  qam and q6h prn available to her, still able to function at AL level

## 2014-10-23 NOTE — Assessment & Plan Note (Signed)
Able to ambulates with walker on unit.

## 2014-11-19 ENCOUNTER — Encounter: Payer: Self-pay | Admitting: Internal Medicine

## 2014-11-19 ENCOUNTER — Non-Acute Institutional Stay: Payer: Medicare Other | Admitting: Internal Medicine

## 2014-11-19 VITALS — BP 130/62 | HR 72 | Temp 97.3°F | Resp 20 | Ht 62.0 in | Wt 139.4 lb

## 2014-11-19 DIAGNOSIS — R269 Unspecified abnormalities of gait and mobility: Secondary | ICD-10-CM | POA: Diagnosis not present

## 2014-11-19 DIAGNOSIS — F02818 Dementia in other diseases classified elsewhere, unspecified severity, with other behavioral disturbance: Secondary | ICD-10-CM

## 2014-11-19 DIAGNOSIS — F0281 Dementia in other diseases classified elsewhere with behavioral disturbance: Secondary | ICD-10-CM | POA: Diagnosis not present

## 2014-11-19 DIAGNOSIS — R609 Edema, unspecified: Secondary | ICD-10-CM

## 2014-11-19 DIAGNOSIS — E785 Hyperlipidemia, unspecified: Secondary | ICD-10-CM | POA: Diagnosis not present

## 2014-11-19 NOTE — Progress Notes (Signed)
Patient ID: [name], female   DOB: July 13, 1918, 81 y.o.   MRN: [id number]    [facility]     [facility] of Service[facility] (12[facility]     Allergies  Allergen Reactions  . Lopid [Gemfibrozil]   . Sulfa Antibiotics   . Zocor [Simvastatin]     Chief Complaint  Patient presents with  . Medical Management of Chronic Issues    HPI:  Dementia due to medical condition with behavioral disturbance: tolerating the donepezil. Good appetite. Weight stable. No dizziness or palpitation. Anxiety is improved.  Dyslipidemia - no recent lab  Edema - trace amount and variable from month-to-month. Denies discomfort.  Abnormality of gait - continues she is rolling walker. No falls recently.    Medications: Patient's Medications  New Prescriptions   No medications on file  Previous Medications   ACETAMINOPHEN (TYLENOL) 325 MG TABLET    Take 325 mg by mouth. Take one tablet every six hours as needed   ALPRAZOLAM (XANAX) 0.25 MG TABLET    1 by mouth three times daily as needed for anxiety and take 1 by mouth every morning before bathing or changing clothes and take 1 by mouth every 6 hours as needed for anxiety   CALCIUM CARBONATE (TUMS) 500 MG CHEWABLE TABLET    Chew 3 tablets by mouth daily. Take three tablets once daily   CHOLECALCIFEROL (VITAMIN D) 1000 UNITS CAPSULE    Take 1,000 Units by mouth. Take 2 daily   DONEPEZIL (ARICEPT) 10 MG TABLET    One daily to help preserve memory.  Modified Medications   No medications on file  Discontinued Medications   No medications on file     Review of Systems  Constitutional: Negative for chills, diaphoresis, activity change, appetite change and fatigue.  HENT: Positive for hearing loss. Negative for congestion, ear pain, facial swelling, nosebleeds, postnasal drip, sneezing and tinnitus.   Eyes: Negative for photophobia, pain, discharge, itching and visual disturbance.  Respiratory: Negative for cough, choking, chest tightness,  shortness of breath and stridor.   Cardiovascular: Negative for chest pain, palpitations and leg swelling.  Gastrointestinal: Positive for constipation. Negative for nausea, abdominal pain, diarrhea and abdominal distention.  Endocrine: Negative.   Genitourinary:       Incontinent  Musculoskeletal: Positive for arthralgias and gait problem. Negative for myalgias, back pain, joint swelling, neck pain and neck stiffness.  Allergic/Immunologic: Negative.   Neurological: Negative for dizziness, tremors, seizures, syncope, facial asymmetry, speech difficulty, light-headedness, numbness and headaches.  Hematological: Negative.   Psychiatric/Behavioral: Positive for behavioral problems, confusion, decreased concentration and agitation. Negative for hallucinations, sleep disturbance, self-injury and dysphoric mood. The patient is nervous/anxious. The patient is not hyperactive.     Filed Vitals:   11/19/14 1346  BP: 130/62  Pulse: 72  Temp: 97.3 F (36.3 C)  TempSrc: Oral  Resp: 20  Height:  (1.575 m)  Weight: 139 lb 6.4 oz (63.231 kg)  SpO2: 94%   Body mass index is 25.49 kg/(m^2).  Physical Exam  Constitutional:  overweight  HENT:  Mild loss of hearing  Eyes:  Corrective lenses.  Neck: No JVD present. No thyromegaly present.  Cardiovascular: Normal rate, regular rhythm, normal heart sounds and intact distal pulses.  Exam reveals no gallop and no friction rub.   No murmur heard. Pulmonary/Chest: Effort normal and breath sounds normal. No respiratory distress. She has no wheezes. She has no rales. She exhibits no tenderness.  Abdominal: Bowel sounds are normal. She exhibits  no distension and no mass. There is no tenderness.  Musculoskeletal: Normal range of motion. She exhibits no edema or tenderness.  Unstable gait; using 4 wheel walker.  Lymphadenopathy:    She has no cervical adenopathy.  Neurological: She is alert. No cranial nerve deficit. Coordination normal.  10/23/13  MMSE 17/30. Failed clock drawing.  Skin: No rash noted. No erythema. No pallor.  Psychiatric: She has a normal mood and affect.     Labs reviewed: Nursing Home on 10/23/2014  Component Date Value Ref Range Status  . Hemoglobin 10/23/2014 13.2  12.0 - 16.0 g/dL Final  . HCT 95/62/1308 39  36 - 46 % Final  . Platelets 10/23/2014 249  150 - 399 K/L Final  . WBC 10/23/2014 7.1   Final  . Glucose 10/23/2014 83   Final  . BUN 10/23/2014 22* 4 - 21 mg/dL Final  . Creatinine 65/78/4696 0.9  0.5 - 1.1 mg/dL Final  . Potassium 29/52/8413 4.3  3.4 - 5.3 mmol/L Final  . Sodium 10/23/2014 138  137 - 147 mmol/L Final  . Alkaline Phosphatase 10/23/2014 101  25 - 125 U/L Final  . ALT 10/23/2014 11  7 - 35 U/L Final  . AST 10/23/2014 15  13 - 35 U/L Final  . Bilirubin, Total 10/23/2014 0.5   Final  . TSH 10/23/2014 1.94  0.41 - 5.90 uIU/mL Final     Assessment/Plan  1. Dementia due to medical condition with behavioral disturbance Continue Aricept  2. Dyslipidemia Follow-up lipid panel in the next few months  3. Edema Continue to observe. No change in medication.  4. Abnormality of gait Continue to use walker.

## 2015-01-04 ENCOUNTER — Other Ambulatory Visit: Payer: Self-pay | Admitting: *Deleted

## 2015-01-04 MED ORDER — ALPRAZOLAM 0.25 MG PO TABS: ORAL_TABLET | ORAL | Status: DC

## 2015-01-04 NOTE — Telephone Encounter (Signed)
[facility] 

## 2015-03-16 ENCOUNTER — Encounter: Payer: Self-pay | Admitting: Internal Medicine

## 2015-05-17 ENCOUNTER — Encounter: Payer: Self-pay | Admitting: Nurse Practitioner

## 2015-05-17 ENCOUNTER — Non-Acute Institutional Stay: Payer: Medicare Other | Admitting: Nurse Practitioner

## 2015-05-17 DIAGNOSIS — F02818 Dementia in other diseases classified elsewhere, unspecified severity, with other behavioral disturbance: Secondary | ICD-10-CM

## 2015-05-17 DIAGNOSIS — M1990 Unspecified osteoarthritis, unspecified site: Secondary | ICD-10-CM | POA: Insufficient documentation

## 2015-05-17 DIAGNOSIS — R269 Unspecified abnormalities of gait and mobility: Secondary | ICD-10-CM

## 2015-05-17 DIAGNOSIS — M150 Primary generalized (osteo)arthritis: Secondary | ICD-10-CM

## 2015-05-17 DIAGNOSIS — I10 Essential (primary) hypertension: Secondary | ICD-10-CM

## 2015-05-17 DIAGNOSIS — F0281 Dementia in other diseases classified elsewhere with behavioral disturbance: Secondary | ICD-10-CM

## 2015-05-17 DIAGNOSIS — M159 Polyosteoarthritis, unspecified: Secondary | ICD-10-CM

## 2015-05-17 NOTE — Assessment & Plan Note (Signed)
Gradual decline, close supervision needed, continue  Aricept, still able to function at AL level

## 2015-05-17 NOTE — Progress Notes (Signed)
Patient ID: [name], female   DOB: November 05, 1918, 82 y.o.   MRN: [id number]  Location:  AL [facility] Provider:  Chipper Oman NP  Code Status:  DNR Goals of care: Advanced Directive information Does patient have an advance directive?: Yes, Type of Advance Directive: Healthcare Power of [place];Out of facility DNR (pink MOST or yellow form)  Chief Complaint  Patient presents with  . Medical Management of Chronic Issues    Routine visit     HPI: Patient is a 82 y.o. female seen in the AL at [facility] today for evaluation of dementia, taking Aricept, functioning in AL setting with close supervision and chronic multiple sites arthritis  Review of Systems:  Review of Systems  Constitutional: Negative for chills and diaphoresis.  HENT: Positive for hearing loss. Negative for congestion, ear pain, nosebleeds and tinnitus.   Eyes: Negative for photophobia, pain and discharge.  Respiratory: Negative for cough, shortness of breath and stridor.   Cardiovascular: Negative for chest pain, palpitations and leg swelling.  Gastrointestinal: Positive for constipation. Negative for nausea, abdominal pain and diarrhea.  Genitourinary:       Incontinent  Musculoskeletal: Negative for myalgias, back pain and neck pain.  Neurological: Negative for dizziness, tremors, seizures and headaches.  Psychiatric/Behavioral: Negative for hallucinations. The patient is nervous/anxious.     Past Medical History  Diagnosis Date  . Candidiasis 07/01/2012  . Blood in stool 06/29/2011  . Uterine prolapse without mention of vaginal wall prolapse 06/29/2011  . Abnormality of gait 03/02/2011  . Cholelithiasis 08/02/2010  . Memory loss 02/17/2010  . Lumbago 12/22/2009  . Personal history of fall 12/22/2009  . Disturbance of skin sensation 11/25/2009  . Unspecified vitamin D deficiency 08/19/2009  . Unspecified constipation 09/26/2007  . Other and unspecified hyperlipidemia 03/28/2007  . Pain in joint, upper arm  10/25/2006  . Abnormal mammogram, unspecified 10/25/2006  . Unspecified essential hypertension 07/19/2006  . Osteoarthrosis, unspecified whether generalized or localized, unspecified site 07/19/2006  . Edema 07/19/2006  . Allergic rhinitis due to pollen 07/13/2006  . Dizziness and giddiness 07/13/2006  . Diverticulitis of colon (without mention of hemorrhage) 10/28/2003    Patient Active Problem List   Diagnosis Date Noted  . Osteoarthritis (arthritis due to wear and tear of joints) 05/17/2015  . UTI (urinary tract infection) 08/13/2014  . Fall 05/21/2014  . Other malaise and fatigue 04/24/2013  . Dementia due to medical condition with behavioral disturbance 10/26/2012  . Uterine prolapse without mention of vaginal wall prolapse 06/29/2011  . Abnormality of gait 03/02/2011  . Dyslipidemia 03/28/2007  . Essential hypertension 07/19/2006  . Edema 07/19/2006    Allergies  Allergen Reactions  . Lopid [Gemfibrozil]   . Sulfa Antibiotics   . Zocor [Simvastatin]     Medications: Patient's Medications  New Prescriptions   No medications on file  Previous Medications   ACETAMINOPHEN (TYLENOL) 325 MG TABLET    Take 325 mg by mouth. Take one tablet every six hours as needed   ALPRAZOLAM (XANAX) 0.25 MG TABLET    Take one tablet by mouth every morning before bathing or changing clothes   CALCIUM CARBONATE (TUMS) 500 MG CHEWABLE TABLET    Chew 3 tablets by mouth daily. Take three tablets once daily   CHOLECALCIFEROL (VITAMIN D) 1000 UNITS CAPSULE    Take 1,000 Units by mouth. Take 2 daily   DONEPEZIL (ARICEPT) 10 MG TABLET    One daily to help preserve memory.  Modified Medications   No  medications on file  Discontinued Medications   No medications on file    Physical Exam: There were no vitals filed for this visit. There is no weight on file to calculate BMI.  Physical Exam  Constitutional:  overweight  HENT:  Mild loss of hearing  Eyes:  Corrective lenses.  Neck: No JVD present.  No thyromegaly present.  Cardiovascular: Normal rate, regular rhythm, normal heart sounds and intact distal pulses.  Exam reveals no gallop and no friction rub.   No murmur heard. Pulmonary/Chest: Effort normal and breath sounds normal. No respiratory distress. She has no wheezes. She has no rales. She exhibits no tenderness.  Abdominal: Bowel sounds are normal. She exhibits no distension and no mass. There is no tenderness.  Musculoskeletal: Normal range of motion. She exhibits no edema or tenderness.  Unstable gait; using 4 wheel walker.  Lymphadenopathy:    She has no cervical adenopathy.  Neurological: She is alert. No cranial nerve deficit. Coordination normal.  10/23/13 MMSE 17/30. Failed clock drawing.  Skin: No rash noted. No erythema. No pallor.  Psychiatric: She has a normal mood and affect.    Labs reviewed: Basic Metabolic Panel:  Recent Labs  16/10/96  NA 138  K 4.3  BUN 22*  CREATININE 0.9    Liver Function Tests:  Recent Labs  10/23/14  AST 15  ALT 11  ALKPHOS 101    CBC:  Recent Labs  10/23/14  WBC 7.1  HGB 13.2  HCT 39  PLT 249    Lab Results  Component Value Date   TSH 1.94 10/23/2014   Lab Results  Component Value Date   HGBA1C 5.7 03/10/2014   Lab Results  Component Value Date   CHOL 297* 04/01/2013   HDL 54 04/01/2013   LDLCALC 200 04/01/2013   TRIG 214* 04/01/2013    Significant Diagnostic Results since last visit: none  Patient Care Team: [name], MD as PCP - General (Internal Medicine) [facility] Hether Anselmo X, NP as Nurse Practitioner (Nurse Practitioner)  Assessment/Plan Problem List Items Addressed This Visit    Osteoarthritis (arthritis due to wear and tear of joints)    Multiple sites, prn Tylenol, ambulates with walker, frequent resting      Essential hypertension    Controlled.       Dementia due to medical condition with behavioral disturbance - Primary    Gradual decline, close supervision  needed, continue  Aricept, still able to function at AL level      Abnormality of gait    Able to ambulate with walker on unit.           Family/ staff Communication: continue AL for care needs.   Labs/tests ordered: none  ManXie Osiah Haring NP Geriatrics [facility] [address], [place] [address] On Call:  [phone] & follow prompts after 5pm & weekends Office Phone:  [phone] Office Fax:  [phone]

## 2015-05-17 NOTE — Assessment & Plan Note (Signed)
Controlled.  

## 2015-05-17 NOTE — Assessment & Plan Note (Signed)
Able to ambulate with walker on unit.

## 2015-05-17 NOTE — Assessment & Plan Note (Signed)
Multiple sites, prn Tylenol, ambulates with walker, frequent resting   

## 2015-05-17 NOTE — Progress Notes (Signed)
Patient ID: [name], female   DOB: [birth year], 82 y.o.   MRN: [id number]

## 2015-05-20 ENCOUNTER — Encounter: Payer: Self-pay | Admitting: Internal Medicine

## 2015-05-20 ENCOUNTER — Non-Acute Institutional Stay: Payer: Medicare Other | Admitting: Internal Medicine

## 2015-05-20 VITALS — BP 142/76 | HR 60 | Temp 98.0°F | Ht 62.0 in | Wt 144.0 lb

## 2015-05-20 DIAGNOSIS — F02818 Dementia in other diseases classified elsewhere, unspecified severity, with other behavioral disturbance: Secondary | ICD-10-CM

## 2015-05-20 DIAGNOSIS — I10 Essential (primary) hypertension: Secondary | ICD-10-CM | POA: Diagnosis not present

## 2015-05-20 DIAGNOSIS — F0281 Dementia in other diseases classified elsewhere with behavioral disturbance: Secondary | ICD-10-CM | POA: Diagnosis not present

## 2015-05-20 DIAGNOSIS — R269 Unspecified abnormalities of gait and mobility: Secondary | ICD-10-CM

## 2015-05-20 NOTE — Progress Notes (Signed)
Patient ID: [name], female   DOB: 1918/08/14, 82 y.o.   MRN: [id number]    [place] Room Number: 881  Place of Service: Clinic (12)     Allergies  Allergen Reactions  . Lopid [Gemfibrozil]   . Sulfa Antibiotics   . Zocor [Simvastatin]     Chief Complaint  Patient presents with  . Medical Management of Chronic Issues    blood pressure, dementia    HPI:  Dementia due to medical condition with behavioral disturbance - remains on donepezil. No significant change in memory since last seen 6 months ago. Behavior seemed to have improved a little bit. Appetite is fine. She has gained 8 pounds in the last 7 months.  Abnormality of gait - continues to use four-wheel walker with seat. No falls.  Essential hypertension - controlled    Medications: Patient's Medications  New Prescriptions   No medications on file  Previous Medications   ACETAMINOPHEN (TYLENOL) 325 MG TABLET    Take 325 mg by mouth. Take one tablet every six hours as needed   ALPRAZOLAM (XANAX) 0.25 MG TABLET    Take one tablet by mouth every morning before bathing or changing clothes   CALCIUM CARBONATE (TUMS) 500 MG CHEWABLE TABLET    Chew 3 tablets by mouth daily. Take three tablets once daily   CHOLECALCIFEROL (VITAMIN D) 1000 UNITS CAPSULE    Take 1,000 Units by mouth. Take 2 daily   DONEPEZIL (ARICEPT) 10 MG TABLET    One daily to help preserve memory.  Modified Medications   No medications on file  Discontinued Medications   No medications on file     Review of Systems  Constitutional: Negative for chills, diaphoresis, activity change, appetite change and fatigue.  HENT: Positive for hearing loss. Negative for congestion, ear pain, facial swelling, nosebleeds, postnasal drip, sneezing and tinnitus.   Eyes: Negative for photophobia, pain, discharge, itching and visual disturbance.  Respiratory: Negative for cough, choking, chest tightness, shortness of breath and  stridor.   Cardiovascular: Negative for chest pain, palpitations and leg swelling.  Gastrointestinal: Positive for constipation. Negative for nausea, abdominal pain, diarrhea and abdominal distention.  Endocrine: Negative.   Genitourinary:       Incontinent  Musculoskeletal: Positive for arthralgias and gait problem. Negative for myalgias, back pain, joint swelling, neck pain and neck stiffness.  Allergic/Immunologic: Negative.   Neurological: Negative for dizziness, tremors, seizures, syncope, facial asymmetry, speech difficulty, light-headedness, numbness and headaches.  Hematological: Negative.   Psychiatric/Behavioral: Positive for behavioral problems, confusion, decreased concentration and agitation. Negative for hallucinations, sleep disturbance, self-injury and dysphoric mood. The patient is nervous/anxious. The patient is not hyperactive.     Filed Vitals:   05/20/15 1340  BP: 142/76  Pulse: 60  Temp: 98 F (36.7 C)  TempSrc: Oral  Height: _0  (1.575 m)  Weight: 144 lb (65.318 kg)  SpO2: 91%   Wt Readings from Last 3 Encounters:  05/20/15 144 lb (65.318 kg)  05/17/15 143 lb 12.8 oz (65.227 kg)  11/19/14 139 lb 6.4 oz (63.231 kg)    Body mass index is 26.33 kg/(m^2).  Physical Exam  Constitutional:  overweight  HENT:  Mild loss of hearing  Eyes:  Corrective lenses.  Neck: No JVD present. No thyromegaly present.  Cardiovascular: Normal rate, regular rhythm, normal heart sounds and intact distal pulses.  Exam reveals no gallop and no friction rub.   No murmur heard. Pulmonary/Chest: Effort normal and breath sounds  normal. No respiratory distress. She has no wheezes. She has no rales. She exhibits no tenderness.  Abdominal: Bowel sounds are normal. She exhibits no distension and no mass. There is no tenderness.  Musculoskeletal: Normal range of motion. She exhibits no edema or tenderness.  Unstable gait; using 4 wheel walker.  Lymphadenopathy:    She has no  cervical adenopathy.  Neurological: She is alert. No cranial nerve deficit. Coordination normal.  10/23/13 MMSE 17/30. Failed clock drawing.  Skin: No rash noted. No erythema. No pallor.  Psychiatric: She has a normal mood and affect.     Labs reviewed: Lab Summary Latest Ref Rng 10/23/2014 03/10/2014 04/01/2013 04/01/2013 04/01/2013  Hemoglobin 12.0 - 16.0 g/dL 13.2 15.9 (None) (None) (None)  Hematocrit 36 - 46 % 39 45 (None) (None) (None)  White count - 7.1 8.2 (None) (None) (None)  Platelet count 150 - 399 K/L 249 259 (None) (None) (None)  Sodium 137 - 147 mmol/L 138 137 140 140 140  Potassium 3.4 - 5.3 mmol/L 4.3 4.4 4.2 4.2 4.2  Calcium - (None) (None) (None) (None) (None)  Phosphorus - (None) (None) (None) (None) (None)  Creatinine 0.5 - 1.1 mg/dL 0.9 1.0 1.2(A) 1.2(A) 1.2(A)  AST 13 - 35 U/L _0 Alk Phos 25 - 125 U/L 101 103 82 82 82  Bilirubin - (None) (None) (None) (None) (None)  Glucose - 83 90 88 88 88  Cholesterol 0 - 200 mg/dL (None) (None) (None) (None) 297(A)  HDL cholesterol 35 - 70 mg/dL (None) (None) (None) (None) 54  Triglycerides 40 - 160 mg/dL (None) (None) (None) (None) 214(A)  LDL Direct - (None) (None) (None) (None) (None)  LDL Calc - (None) (None) (None) (None) 200  Total protein - (None) (None) (None) (None) (None)  Albumin - (None) (None) (None) (None) (None)   Lab Results  Component Value Date   TSH 1.94 10/23/2014   Lab Results  Component Value Date   BUN 22* 10/23/2014   BUN 16 03/10/2014   BUN 17 04/01/2013   BUN 27* 04/01/2013   BUN 27* 04/01/2013   Lab Results  Component Value Date   CREATININE 0.9 10/23/2014   CREATININE 1.0 03/10/2014   CREATININE 1.2* 04/01/2013   CREATININE 1.2* 04/01/2013   CREATININE 1.2* 04/01/2013   Lab Results  Component Value Date   HGBA1C 5.7 03/10/2014       Assessment/Plan  1. Dementia due to medical condition with behavioral disturbance Stable. Continue donepezil  2. Abnormality of  gait Gait instability. Continue use of walker.  3. Essential hypertension Adequately controlled. Off medication.

## 2015-06-03 ENCOUNTER — Encounter: Payer: Self-pay | Admitting: Internal Medicine

## 2015-06-24 ENCOUNTER — Encounter: Payer: Self-pay | Admitting: Nurse Practitioner

## 2015-06-24 ENCOUNTER — Non-Acute Institutional Stay: Payer: Medicare Other | Admitting: Nurse Practitioner

## 2015-06-24 DIAGNOSIS — I10 Essential (primary) hypertension: Secondary | ICD-10-CM

## 2015-06-24 DIAGNOSIS — M150 Primary generalized (osteo)arthritis: Secondary | ICD-10-CM

## 2015-06-24 DIAGNOSIS — R269 Unspecified abnormalities of gait and mobility: Secondary | ICD-10-CM | POA: Diagnosis not present

## 2015-06-24 DIAGNOSIS — M159 Polyosteoarthritis, unspecified: Secondary | ICD-10-CM

## 2015-06-24 DIAGNOSIS — F0281 Dementia in other diseases classified elsewhere with behavioral disturbance: Secondary | ICD-10-CM | POA: Diagnosis not present

## 2015-06-24 DIAGNOSIS — F02818 Dementia in other diseases classified elsewhere, unspecified severity, with other behavioral disturbance: Secondary | ICD-10-CM

## 2015-06-24 NOTE — Progress Notes (Signed)
Patient ID: Sierra PeekSarah I Dowse, female   DOB: 08/15/1918, 82 y.o.   MRN: [id number]  Location:  [facility] Room Number: 910 Place of Service: AL [facility] Provider:  Arna SnipeManXie Kamala Kolton NP  GREEN, Lenon CurtARTHUR G, MD  Patient Care Team: Kimber RelicArthur G Green, MD as PCP - General (Internal Medicine) [facility] Jeilyn Reznik X, NP as Nurse Practitioner (Nurse Practitioner)  Extended Emergency Contact Information Primary Emergency Contact: Jerry CarasFoster,Ron Address: [address]          [place], [place] [id number] [place] Home Phone: [phone] Relation: Son Secondary Emergency Contact: [name],[name] Address: [address]          [place], [place] [id number] [place] Home Phone: [phone] Relation: Daughter  Code Status:  DNR Goals of care: Advanced Directive information Advanced Directives 06/24/2015  Does patient have an advance directive? Yes  Type of Estate agentAdvance Directive Healthcare Power of [place];Out of facility DNR (pink MOST or yellow form)  Does patient want to make changes to advanced directive? No - Patient declined  Copy of advanced directive(s) in chart? Yes  Pre-existing out of facility DNR order (yellow form or pink MOST form) -     Chief Complaint  Patient presents with  . Medical Management of Chronic Issues    Routine Visit    HPI:  Pt is a 82 y.o. female seen today for medical management of chronic diseases.  Hx of dementia, taking Aricept, functioning in AL setting with close supervision and chronic multiple sites arthritis    Past Medical History  Diagnosis Date  . Candidiasis 07/01/2012  . Blood in stool 06/29/2011  . Uterine prolapse without mention of vaginal wall prolapse 06/29/2011  . Abnormality of gait 03/02/2011  . Cholelithiasis 08/02/2010  . Memory loss 02/17/2010  . Lumbago 12/22/2009  . Personal history of fall 12/22/2009  . Disturbance of skin sensation 11/25/2009  . Unspecified vitamin D deficiency 08/19/2009  .  Unspecified constipation 09/26/2007  . Other and unspecified hyperlipidemia 03/28/2007  . Pain in joint, upper arm 10/25/2006  . Abnormal mammogram, unspecified 10/25/2006  . Unspecified essential hypertension 07/19/2006  . Osteoarthrosis, unspecified whether generalized or localized, unspecified site 07/19/2006  . Edema 07/19/2006  . Allergic rhinitis due to pollen 07/13/2006  . Dizziness and giddiness 07/13/2006  . Diverticulitis of colon (without mention of hemorrhage) 10/28/2003   Past Surgical History  Procedure Laterality Date  . Cataract extraction w/ intraocular lens  implant, bilateral Bilateral 1999    Allergies  Allergen Reactions  . Lopid [Gemfibrozil]   . Sulfa Antibiotics   . Zocor [Simvastatin]       Medication List       This list is accurate as of: 06/24/15  6:05 PM.  Always use your most recent med list.               acetaminophen 325 MG tablet  Commonly known as:  TYLENOL  Take 325 mg by mouth. Take one tablet every six hours as needed     ALPRAZolam 0.25 MG tablet  Commonly known as:  XANAX  Take one tablet by mouth every morning before bathing or changing clothes     donepezil 10 MG tablet  Commonly known as:  ARICEPT  One daily to help preserve memory.     TUMS 500 MG chewable tablet  Generic drug:  calcium carbonate  Chew 3 tablets by mouth daily. Take three tablets once daily  Vitamin D 1000 units capsule  Take 1,000 Units by mouth. Take 2 daily        Review of Systems  Constitutional: Negative for chills and diaphoresis.  HENT: Positive for hearing loss. Negative for congestion, ear pain, nosebleeds and tinnitus.   Eyes: Negative for photophobia, pain and discharge.  Respiratory: Negative for cough, shortness of breath and stridor.   Cardiovascular: Negative for chest pain, palpitations and leg swelling.  Gastrointestinal: Positive for constipation. Negative for nausea, abdominal pain and diarrhea.  Genitourinary:       Incontinent    Musculoskeletal: Negative for myalgias, back pain and neck pain.  Neurological: Negative for dizziness, tremors, seizures and headaches.  Psychiatric/Behavioral: Negative for hallucinations. The patient is nervous/anxious.     Immunization History  Administered Date(s) Administered  . Influenza Whole 01/16/2012, 02/20/2013  . Influenza-Unspecified 02/19/2014, 01/05/2015  . PPD Test 08/23/2010  . Pneumococcal Polysaccharide-23 06/24/1998  . Td 05/02/1974   Pertinent  Health Maintenance Due  Topic Date Due  . DEXA SCAN  03/19/1986  . PNA vac Low Risk Adult (2 of 2 - PCV13) 06/24/1999  . INFLUENZA VACCINE  11/16/2015   Fall Risk  11/19/2014 11/19/2014 05/21/2014  Falls in the past year? Yes No Yes  Number falls in past yr: 1 - -  Injury with Fall? No - -   Functional Status Survey:    Filed Vitals:   06/24/15 0822  BP: 100/56  Pulse: 71  Temp: 98.3 F (36.8 C)  TempSrc: Oral  Resp: 18  Height:  (1.575 m)  Weight: 141 lb 3.2 oz (64.048 kg)   Body mass index is 25.82 kg/(m^2). Physical Exam  Constitutional:  overweight  HENT:  Mild loss of hearing  Eyes:  Corrective lenses.  Neck: No JVD present. No thyromegaly present.  Cardiovascular: Normal rate, regular rhythm, normal heart sounds and intact distal pulses.  Exam reveals no gallop and no friction rub.   No murmur heard. Pulmonary/Chest: Effort normal and breath sounds normal. No respiratory distress. She has no wheezes. She has no rales. She exhibits no tenderness.  Abdominal: Bowel sounds are normal. She exhibits no distension and no mass. There is no tenderness.  Musculoskeletal: Normal range of motion. She exhibits no edema or tenderness.  Unstable gait; using 4 wheel walker.  Lymphadenopathy:    She has no cervical adenopathy.  Neurological: She is alert. No cranial nerve deficit. Coordination normal.  10/23/13 MMSE 17/30. Failed clock drawing.  Skin: No rash noted. No erythema. No pallor.  Psychiatric: She  has a normal mood and affect.    Labs reviewed:  Recent Labs  10/23/14  NA 138  K 4.3  BUN 22*  CREATININE 0.9    Recent Labs  10/23/14  AST 15  ALT 11  ALKPHOS 101    Recent Labs  10/23/14  WBC 7.1  HGB 13.2  HCT 39  PLT 249   Lab Results  Component Value Date   TSH 1.94 10/23/2014   Lab Results  Component Value Date   HGBA1C 5.7 03/10/2014   Lab Results  Component Value Date   CHOL 297* 04/01/2013   HDL 54 04/01/2013   LDLCALC 200 04/01/2013   TRIG 214* 04/01/2013    Significant Diagnostic Results in last 30 days:  No results found.  Assessment/Plan  Abnormality of gait Able to ambulate with walker on unit.   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   Osteoarthritis (arthritis due to wear and tear of joints) Multiple sites, prn Tylenol, ambulates with walker, frequent resting     Family/ staff Communication:  Continue AL for care needs.   Labs/tests ordered: none

## 2015-06-24 NOTE — Assessment & Plan Note (Signed)
Gradual decline, close supervision needed, continue  Aricept, still able to function at AL level

## 2015-06-24 NOTE — Assessment & Plan Note (Signed)
Able to ambulate with walker on unit.

## 2015-06-24 NOTE — Assessment & Plan Note (Signed)
Multiple sites, prn Tylenol, ambulates with walker, frequent resting   

## 2015-06-24 NOTE — Assessment & Plan Note (Signed)
Controlled.  

## 2015-07-20 ENCOUNTER — Other Ambulatory Visit: Payer: Self-pay

## 2015-07-20 MED ORDER — ALPRAZOLAM 0.25 MG PO TABS: ORAL_TABLET | ORAL | Status: DC

## 2015-07-20 NOTE — Telephone Encounter (Signed)
RX sent to [facility] pharmacy @ [phone], phone number [phone]. This is a nursing home refill request.   

## 2015-08-02 ENCOUNTER — Non-Acute Institutional Stay (SKILLED_NURSING_FACILITY): Payer: Medicare Other | Admitting: Nurse Practitioner

## 2015-08-02 DIAGNOSIS — F0281 Dementia in other diseases classified elsewhere with behavioral disturbance: Secondary | ICD-10-CM

## 2015-08-02 DIAGNOSIS — M150 Primary generalized (osteo)arthritis: Secondary | ICD-10-CM

## 2015-08-02 DIAGNOSIS — I10 Essential (primary) hypertension: Secondary | ICD-10-CM | POA: Diagnosis not present

## 2015-08-02 DIAGNOSIS — R269 Unspecified abnormalities of gait and mobility: Secondary | ICD-10-CM

## 2015-08-02 DIAGNOSIS — M159 Polyosteoarthritis, unspecified: Secondary | ICD-10-CM

## 2015-08-02 DIAGNOSIS — F02818 Dementia in other diseases classified elsewhere, unspecified severity, with other behavioral disturbance: Secondary | ICD-10-CM

## 2015-08-02 NOTE — Assessment & Plan Note (Signed)
Able to ambulate with walker on unit.

## 2015-08-02 NOTE — Assessment & Plan Note (Signed)
Multiple sites, prn Tylenol, ambulates with walker, frequent resting   

## 2015-08-02 NOTE — Assessment & Plan Note (Signed)
Controlled.  

## 2015-08-02 NOTE — Progress Notes (Signed)
Patient ID: [name], female   DOB: Nov 28, 1918, 82 y.o.   MRN: [id number]  Location:  [facility] Room Number: 111 Place of Service: SNF [facility] Provider:  Arna Snipe Aimy Sweeting NP  GREEN, Lenon Curt, MD  Patient Care Team: [name], MD as PCP - General (Internal Medicine) [facility] Denyce Harr X, NP as Nurse Practitioner (Nurse Practitioner)  Extended Emergency Contact Information Primary Emergency Contact: [name] Address: [address]          [place], [place] [address] [place] Home Phone: [phone] Relation: Son Secondary Emergency Contact: [name],[name] Address: [address]          [place], [place] [address] [place] Home Phone: [phone] Relation: Daughter  Code Status:  DNR Goals of care: Advanced Directive information Advanced Directives 08/06/2015  Does patient have an advance directive? Yes  Type of Estate agent of [place];Out of facility DNR (pink MOST or yellow form)  Does patient want to make changes to advanced directive? No - Patient declined  Copy of advanced directive(s) in chart? Yes  Pre-existing out of facility DNR order (yellow form or pink MOST form) -     Chief Complaint  Patient presents with  . Medical Management of Chronic Issues    Routine Visit    HPI:  Pt is a 82 y.o. female seen today for medical management of chronic diseases.  Hx of dementia, taking Aricept, admitted Memory Care unit [facility] and chronic multiple sites arthritis    Past Medical History  Diagnosis Date  . Candidiasis 07/01/2012  . Blood in stool 06/29/2011  . Uterine prolapse without mention of vaginal wall prolapse 06/29/2011  . Abnormality of gait 03/02/2011  . Cholelithiasis 08/02/2010  . Memory loss 02/17/2010  . Lumbago 12/22/2009  . Personal history of fall 12/22/2009  . Disturbance of skin sensation 11/25/2009  . Unspecified vitamin D deficiency 08/19/2009  . Unspecified  constipation 09/26/2007  . Other and unspecified hyperlipidemia 03/28/2007  . Pain in joint, upper arm 10/25/2006  . Abnormal mammogram, unspecified 10/25/2006  . Unspecified essential hypertension 07/19/2006  . Osteoarthrosis, unspecified whether generalized or localized, unspecified site 07/19/2006  . Edema 07/19/2006  . Allergic rhinitis due to pollen 07/13/2006  . Dizziness and giddiness 07/13/2006  . Diverticulitis of colon (without mention of hemorrhage) 10/28/2003   Past Surgical History  Procedure Laterality Date  . Cataract extraction w/ intraocular lens  implant, bilateral Bilateral 1999    Allergies  Allergen Reactions  . Lopid [Gemfibrozil]   . Sulfa Antibiotics   . Zocor [Simvastatin]       Medication List       This list is accurate as of: 08/02/15 11:59 PM.  Always use your most recent med list.               acetaminophen 325 MG tablet  Commonly known as:  TYLENOL  Take 325 mg by mouth. Take one tablet every six hours as needed     ALPRAZolam 0.25 MG tablet  Commonly known as:  XANAX  Take one tablet by mouth every morning before bathing or changing clothes     donepezil 10 MG tablet  Commonly known as:  ARICEPT  One daily to help preserve memory.     TUMS 500 MG chewable tablet  Generic drug:  calcium carbonate  Chew 3 tablets by mouth daily. Take three tablets once daily     Vitamin D 1000  units capsule  Take 1,000 Units by mouth. Take 2 daily        Review of Systems  Constitutional: Negative for chills and diaphoresis.  HENT: Positive for hearing loss. Negative for congestion, ear pain, nosebleeds and tinnitus.   Eyes: Negative for photophobia, pain and discharge.  Respiratory: Negative for cough, shortness of breath and stridor.   Cardiovascular: Negative for chest pain, palpitations and leg swelling.  Gastrointestinal: Positive for constipation. Negative for nausea, abdominal pain and diarrhea.  Genitourinary:       Incontinent    Musculoskeletal: Negative for myalgias, back pain and neck pain.  Neurological: Negative for dizziness, tremors, seizures and headaches.  Psychiatric/Behavioral: Negative for hallucinations. The patient is nervous/anxious.     Immunization History  Administered Date(s) Administered  . Influenza Whole 01/16/2012, 02/20/2013  . Influenza-Unspecified 02/19/2014, 01/05/2015  . PPD Test 08/23/2010, 08/04/2015  . Pneumococcal Polysaccharide-23 06/24/1998  . Td 05/02/1974   Pertinent  Health Maintenance Due  Topic Date Due  . DEXA SCAN  03/19/1986  . PNA vac Low Risk Adult (2 of 2 - PCV13) 06/24/1999  . INFLUENZA VACCINE  11/16/2015   Fall Risk  08/06/2015 08/06/2015 11/19/2014 11/19/2014 05/21/2014  Falls in the past year? No No Yes No Yes  Number falls in past yr: - - 1 - -  Injury with Fall? - - No - -   Functional Status Survey:    Filed Vitals:   08/06/15 1604  BP: 120/68  Pulse: 80  Temp: 98.3 F (36.8 C)  TempSrc: Oral  Resp: 20  Height:  (1.575 m)  Weight: 141 lb (63.957 kg)   Body mass index is 25.78 kg/(m^2). Physical Exam  Constitutional:  overweight  HENT:  Mild loss of hearing  Eyes:  Corrective lenses.  Neck: No JVD present. No thyromegaly present.  Cardiovascular: Normal rate, regular rhythm, normal heart sounds and intact distal pulses.  Exam reveals no gallop and no friction rub.   No murmur heard. Pulmonary/Chest: Effort normal and breath sounds normal. No respiratory distress. She has no wheezes. She has no rales. She exhibits no tenderness.  Abdominal: Bowel sounds are normal. She exhibits no distension and no mass. There is no tenderness.  Musculoskeletal: Normal range of motion. She exhibits no edema or tenderness.  Unstable gait; using 4 wheel walker.  Lymphadenopathy:    She has no cervical adenopathy.  Neurological: She is alert. No cranial nerve deficit. Coordination normal.  10/23/13 MMSE 17/30. Failed clock drawing.  Skin: No rash noted. No  erythema. No pallor.  Psychiatric: She has a normal mood and affect.    Labs reviewed:  Recent Labs  10/23/14  NA 138  K 4.3  BUN 22*  CREATININE 0.9    Recent Labs  10/23/14  AST 15  ALT 11  ALKPHOS 101    Recent Labs  10/23/14  WBC 7.1  HGB 13.2  HCT 39  PLT 249   Lab Results  Component Value Date   TSH 1.94 10/23/2014   Lab Results  Component Value Date   HGBA1C 5.7 03/10/2014   Lab Results  Component Value Date   CHOL 297* 04/01/2013   HDL 54 04/01/2013   LDLCALC 200 04/01/2013   TRIG 214* 04/01/2013    Significant Diagnostic Results in last 30 days:  No results found.  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 Update CBC, CMP, TSH  Essential hypertension Controlled.    Osteoarthritis (arthritis due to wear and tear of joints) Multiple sites, prn Tylenol, ambulates with walker, frequent resting    Abnormality of gait Able to ambulate with walker on unit.      Family/ staff Communication:  [facility].   Labs/tests ordered: CBC, CMP, TSH

## 2015-08-02 NOTE — Assessment & Plan Note (Signed)
Gradual decline, close supervision needed, continue  Aricept, SNF for care needs. Update CBC, CMP, TSH

## 2015-08-03 LAB — BASIC METABOLIC PANEL
BUN: 20 mg/dL (ref 4–21)
Creatinine: 1.2 mg/dL — AB (ref <=1.1)
Glucose: 78 mg/dL
POTASSIUM: 4.1 mmol/L (ref 3.4–5.3)
Sodium: 137 mmol/L (ref 137–147)

## 2015-08-03 LAB — CBC AND DIFFERENTIAL
HEMATOCRIT: 40 % (ref 36–46)
HEMOGLOBIN: 13.8 g/dL (ref 12.0–16.0)
Platelets: 211 10*3/uL (ref 150–399)
WBC: 6.5 10*3/mL

## 2015-08-03 LAB — HEPATIC FUNCTION PANEL
ALT: 17 U/L (ref 7–35)
AST: 21 U/L (ref 13–35)
Alkaline Phosphatase: 80 U/L (ref 25–125)
BILIRUBIN, TOTAL: 0.4 mg/dL

## 2015-08-04 LAB — TSH: TSH: 2.72 u[IU]/mL (ref <=5.90)

## 2015-08-06 ENCOUNTER — Non-Acute Institutional Stay (SKILLED_NURSING_FACILITY): Payer: Medicare Other | Admitting: Internal Medicine

## 2015-08-06 ENCOUNTER — Encounter: Payer: Self-pay | Admitting: Nurse Practitioner

## 2015-08-06 ENCOUNTER — Encounter: Payer: Self-pay | Admitting: Internal Medicine

## 2015-08-06 DIAGNOSIS — F0281 Dementia in other diseases classified elsewhere with behavioral disturbance: Secondary | ICD-10-CM | POA: Diagnosis not present

## 2015-08-06 DIAGNOSIS — R269 Unspecified abnormalities of gait and mobility: Secondary | ICD-10-CM | POA: Diagnosis not present

## 2015-08-06 DIAGNOSIS — F02818 Dementia in other diseases classified elsewhere, unspecified severity, with other behavioral disturbance: Secondary | ICD-10-CM

## 2015-08-06 DIAGNOSIS — I10 Essential (primary) hypertension: Secondary | ICD-10-CM

## 2015-08-06 NOTE — Progress Notes (Signed)
Patient ID: Sierra PeekSarah I Leggio, female   DOB: 03/24/1919, 82 y.o.   MRN: [id number]   Location:  [facility] Room Number: 111 Place of Service:  SNF (31)  PCP: Kimber RelicGREEN, Layten Aiken G, MD Patient Care Team: Kimber RelicArthur G Arleene Settle, MD as PCP - General (Internal Medicine) [facility] Man Mast X, NP as Nurse Practitioner (Nurse Practitioner)  Extended Emergency Contact Information Primary Emergency Contact: Jerry CarasFoster,Ron Address: [address]          [place], [place] [address] [place] Home Phone: [phone] Relation: Son Secondary Emergency Contact: [name],[name] Address: [address]          [place], [place] [address] [place] Home Phone: [phone] Relation: Daughter  Code Status: DO NOT RESUSCITATE Goals of Care: Advanced Directive information Advanced Directives 08/06/2015  Does patient have an advance directive? Yes  Type of Estate agentAdvance Directive Healthcare Power of ArdmoreAttorney;Out of facility DNR (pink MOST or yellow form)  Does patient want to make changes to advanced directive? No - Patient declined  Copy of advanced directive(s) in chart? Yes  Pre-existing out of facility DNR order (yellow form or pink MOST form) Yellow form placed in chart (order not valid for inpatient use)      Chief Complaint  Patient presents with  . New Admit To SNF    HPI: Patient is a 82 y.o. female seen today for admission toMemory care section of [facility] skilled nursing [facility]. Patient has dementia and has become progressively less capable of taking care of herself. Previously lived in the assisted 11/residential care building section [facility].  Dementia has been treated with Aricept, but has progressed nevertheless. There also have been some behavioral disturbances and neglect in self-care and cleanliness.  She has a gait disturbance but no recent falls. She uses a four-wheel walker with a seat and  brakes.  Other chronic conditions that are well controlled include hypertension, dyslipidemia, mild edema, and osteoarthritis.  It is anticipated that this will be an admission into the memory care area for long-term care..  Past Medical History  Diagnosis Date  . Candidiasis 07/01/2012  . Blood in stool 06/29/2011  . Uterine prolapse without mention of vaginal wall prolapse 06/29/2011  . Abnormality of gait 03/02/2011  . Cholelithiasis 08/02/2010  . Memory loss 02/17/2010  . Lumbago 12/22/2009  . Personal history of fall 12/22/2009  . Disturbance of skin sensation 11/25/2009  . Unspecified vitamin D deficiency 08/19/2009  . Unspecified constipation 09/26/2007  . Other and unspecified hyperlipidemia 03/28/2007  . Pain in joint, upper arm 10/25/2006  . Abnormal mammogram, unspecified 10/25/2006  . Unspecified essential hypertension 07/19/2006  . Osteoarthrosis, unspecified whether generalized or localized, unspecified site 07/19/2006  . Edema 07/19/2006  . Allergic rhinitis due to pollen 07/13/2006  . Dizziness and giddiness 07/13/2006  . Diverticulitis of colon (without mention of hemorrhage) 10/28/2003   Past Surgical History  Procedure Laterality Date  . Cataract extraction w/ intraocular lens  implant, bilateral Bilateral 1999    reports that she has never smoked. She has never used smokeless tobacco. She reports that she does not drink alcohol or use illicit drugs. Social History   Social History  . Marital Status: Married    Spouse Name: N/A  . Number of Children: N/A  . Years of Education: N/A   Occupational History  . retired office work    Social History Main Topics  .  Smoking status: Never Smoker   . Smokeless tobacco: Never Used  . Alcohol Use: No  . Drug Use: No  . Sexual Activity: No   Other Topics Concern  . Not on file   Social History Narrative   Lives at [facility] [place]   DNR   Living Will   Widowed husband died 08/26/03   Walks with walker    Functional  Status Survey:    Family History  Problem Relation Age of Onset  . Heart disease Father     MI    Health Maintenance  Topic Date Due  . ZOSTAVAX  03/19/1981  . TETANUS/TDAP  05/02/1984  . DEXA SCAN  03/19/1986  . PNA vac Low Risk Adult (2 of 2 - PCV13) 06/24/1999  . INFLUENZA VACCINE  11/16/2015    Allergies  Allergen Reactions  . Lopid [Gemfibrozil]   . Sulfa Antibiotics   . Zocor [Simvastatin]       Medication List       This list is accurate as of: 08/06/15 12:26 PM.  Always use your most recent med list.               acetaminophen 325 MG tablet  Commonly known as:  TYLENOL  Take 325 mg by mouth. Take one tablet every six hours as needed     ALPRAZolam 0.25 MG tablet  Commonly known as:  XANAX  Take one tablet by mouth every morning before bathing or changing clothes     donepezil 10 MG tablet  Commonly known as:  ARICEPT  One daily to help preserve memory.     TUMS 500 MG chewable tablet  Generic drug:  calcium carbonate  Chew 3 tablets by mouth daily. Take three tablets once daily     Vitamin D 1000 units capsule  Take 1,000 Units by mouth. Take 2 daily        Review of Systems  Constitutional: Negative for chills, diaphoresis, activity change, appetite change and fatigue.  HENT: Positive for hearing loss. Negative for congestion, ear pain, facial swelling, nosebleeds, postnasal drip, sneezing and tinnitus.   Eyes: Negative for photophobia, pain, discharge, itching and visual disturbance.  Respiratory: Negative for cough, choking, chest tightness, shortness of breath and stridor.   Cardiovascular: Negative for chest pain, palpitations and leg swelling.  Gastrointestinal: Positive for constipation. Negative for nausea, abdominal pain, diarrhea and abdominal distention.  Endocrine: Negative.   Genitourinary:       Incontinent  Musculoskeletal: Positive for arthralgias and gait problem. Negative for myalgias, back pain, joint swelling, neck pain  and neck stiffness.  Allergic/Immunologic: Negative.   Neurological: Negative for dizziness, tremors, seizures, syncope, facial asymmetry, speech difficulty, light-headedness, numbness and headaches.  Hematological: Negative.   Psychiatric/Behavioral: Positive for behavioral problems, confusion, decreased concentration and agitation. Negative for hallucinations, sleep disturbance, self-injury and dysphoric mood. The patient is nervous/anxious. The patient is not hyperactive.     Filed Vitals:   08/06/15 1223  BP: 120/62  Pulse: 72  Temp: 98.1 F (36.7 C)  Resp: 16  Height:  (1.6 m)  Weight: 141 lb (63.957 kg)   Body mass index is 24.98 kg/(m^2). Physical Exam  Constitutional:  overweight  HENT:  Mild loss of hearing  Eyes:  Corrective lenses.  Neck: No JVD present. No thyromegaly present.  Cardiovascular: Normal rate, regular rhythm, normal heart sounds and intact distal pulses.  Exam reveals no gallop and no friction rub.   No murmur heard. Pulmonary/Chest: Effort  normal and breath sounds normal. No respiratory distress. She has no wheezes. She has no rales. She exhibits no tenderness.  Abdominal: Bowel sounds are normal. She exhibits no distension and no mass. There is no tenderness.  Musculoskeletal: Normal range of motion. She exhibits no edema or tenderness.  Unstable gait; using 4 wheel walker.  Lymphadenopathy:    She has no cervical adenopathy.  Neurological: She is alert. No cranial nerve deficit. Coordination normal.  10/23/13 MMSE 17/30. Failed clock drawing.  Skin: No rash noted. No erythema. No pallor.  Psychiatric: She has a normal mood and affect.    Labs reviewed: Basic Metabolic Panel:  Recent Labs  67/12/45  NA 138  K 4.3  BUN 22*  CREATININE 0.9   Liver Function Tests:  Recent Labs  10/23/14  AST 15  ALT 11  ALKPHOS 101   No results for input(s): LIPASE, AMYLASE in the last 8760 hours. No results for input(s): AMMONIA in the last 8760  hours. CBC:  Recent Labs  10/23/14  WBC 7.1  HGB 13.2  HCT 39  PLT 249   Cardiac Enzymes: No results for input(s): CKTOTAL, CKMB, CKMBINDEX, TROPONINI in the last 8760 hours. BNP: Invalid input(s): POCBNP Lab Results  Component Value Date   HGBA1C 5.7 03/10/2014   Lab Results  Component Value Date   TSH 1.94 10/23/2014   No results found for: VITAMINB12 No results found for: FOLATE No results found for: IRON, TIBC, FERRITIN  Imaging and Procedures obtained prior to SNF admission: Dg Chest 2 View  08/05/2010  *RADIOLOGY REPORT* Clinical Data: Chest pain. CHEST - 2 VIEW Comparison: None Findings: Heart and mediastinal contours are within normal limits. No focal opacities or effusions.  No acute bony abnormality. IMPRESSION: No active disease. Original Report Authenticated By: Cyndie Chime, M.D.  Dg Abd 1 View  08/06/2010  *RADIOLOGY REPORT* Clinical Data: Abdominal pain. ABDOMEN - 1 VIEW Comparison: 12/22/2009 Findings: The bowel gas pattern is unremarkable. There is no evidence of bowel obstruction. No suspicious calcifications are identified. Lumbar spine scoliosis and moderate degenerative changes are again noted. IMPRESSION: No acute abnormalities - unremarkable bowel gas pattern. Lumbar spine scoliosis and degenerative changes. Original Report Authenticated By: Rosendo Gros, M.D.  Nm Hepatobiliary Liver Func  08/06/2010  *RADIOLOGY REPORT* Clinical Data:  Abdominal pain and increased LFTs. Zetia induced hepatitis. NUCLEAR MEDICINE HEPATOBILIARY IMAGING Technique:  Sequential images of the abdomen were obtained out to 60 minutes following intravenous administration of radiopharmaceutical. Radiopharmaceutical:  5.0 mCi Tc-60m Choletec Comparison:  08/05/2010 ultrasound Findings: Homogeneous hepatic uptake is identified.  The inferior and left portion of the liver are enlarged. The study was taken out to 3 hours but no biliary, gallbladder or bowel activity is identified.  IMPRESSION: Absence of biliary, gallbladder and bowel activity at 3 hours. Differential includes acute CBD obstruction versus cholestatic hepatitis. Acute CBD obstruction is slightly favored as hepatic activity is usually more diminished with hepatitis. Hepatomegaly. These results were called to Dr. Butler Denmark on 08/06/2010 at 11:25 AM. Original Report Authenticated By: Rosendo Gros, M.D.  US Abdomen Complete  08/05/2010  *RADIOLOGY REPORT* Clinical Data:  Epigastric pain, elevated LFTs, question CBD obstruction versus gallbladder disease COMPLETE ABDOMINAL ULTRASOUND Comparison:  None. Findings: Gallbladder:  Mild gallbladder wall thickening, measuring 5 mm. Mobile gallstone in the fundus.  No definite pericholecystic fluid. Negative sonographic Murphy's sign. Common bile duct:  Measures 4 mm Liver:  No focal lesion identified. Within normal limits in parenchymal echogenicity. No intrahepatic ductal  dilatation. IVC:  Appears normal. Pancreas:  Incompletely visualized, unremarkable. Spleen:  Measures 8.4 cm Right Kidney:  Measures 9.5 cm.  No mass or hydronephrosis. Left Kidney:  Measures 9.0 cm.  No mass or hydronephrosis. Abdominal aorta:  No aneurysm identified. IMPRESSION: Mild gallbladder wall thickening, measuring 5 mm.  Mobile gallstone in the fundus.  Negative sonographic Murphy's sign. These findings are equivocal, but unlikely to represent acute cholecystitis.  If clinically warranted, consider hepatobiliary nuclear medicine scan for further characterization. Original Report Authenticated By: Charline Bills, M.D.  Mr 3d Recon At Scanner  08/06/2010  *RADIOLOGY REPORT* Clinical Data: Epigastric pain.  Cholelithiasis. MRI ABDOMEN WITHOUT AND WITH CONTRAST (MRCP) Technique:  Multiplanar multisequence MR imaging of the abdomen was performed without and with contrast, including heavily T2-weighted images of the biliary and pancreatic ducts.  Three-dimensional MR images were rendered by post processing of  the original MR data. Contrast: 7 ml Eovist, a contrast agent with partial hepatobiliary excretion. Comparison:  Abdominal ultrasound 08/05/2010. Findings:  There are dependent gallstones within the gallbladder lumen.  The gallbladder is well-distended and shows no wall thickening or surrounding inflammation.  There is no intra or extrahepatic biliary dilatation.  There is no evidence of choledocholithiasis.  The cystic duct inserts very low on the common bile duct, just above the pancreatic head. The pancreatic duct is normal in caliber.  There is no pancreas divisum.  There is no evidence of pancreatic mass or surrounding inflammation. The liver demonstrates no focal abnormality or suspicious enhancement.  The spleen and adrenal glands appear normal.  There is a small cyst in the upper pole of the left kidney.  No enlarged abdominal lymph nodes are identified.  There is a convex left lumbar scoliosis with associated moderate spondylosis. IMPRESSION: 1.  Cholelithiasis without evidence of cholecystitis, biliary dilatation or choledocholithiasis.  Of note, the cystic duct has a very low insertion on the common bile duct. 2.  No evidence of pancreatitis or pancreas divisum. 3.  No acute abdominal findings identified. Original Report Authenticated By: Gerrianne Scale, M.D.  Mr Abd W/wo Cm/mrcp  08/06/2010  *RADIOLOGY REPORT* Clinical Data: Epigastric pain.  Cholelithiasis. MRI ABDOMEN WITHOUT AND WITH CONTRAST (MRCP) Technique:  Multiplanar multisequence MR imaging of the abdomen was performed without and with contrast, including heavily T2-weighted images of the biliary and pancreatic ducts.  Three-dimensional MR images were rendered by post processing of the original MR data. Contrast: 7 ml Eovist, a contrast agent with partial hepatobiliary excretion. Comparison:  Abdominal ultrasound 08/05/2010. Findings:  There are dependent gallstones within the gallbladder lumen.  The gallbladder is well-distended and  shows no wall thickening or surrounding inflammation.  There is no intra or extrahepatic biliary dilatation.  There is no evidence of choledocholithiasis.  The cystic duct inserts very low on the common bile duct, just above the pancreatic head. The pancreatic duct is normal in caliber.  There is no pancreas divisum.  There is no evidence of pancreatic mass or surrounding inflammation. The liver demonstrates no focal abnormality or suspicious enhancement.  The spleen and adrenal glands appear normal.  There is a small cyst in the upper pole of the left kidney.  No enlarged abdominal lymph nodes are identified.  There is a convex left lumbar scoliosis with associated moderate spondylosis. IMPRESSION: 1.  Cholelithiasis without evidence of cholecystitis, biliary dilatation or choledocholithiasis.  Of note, the cystic duct has a very low insertion on the common bile duct. 2.  No evidence of pancreatitis or pancreas  divisum. 3.  No acute abdominal findings identified. Original Report Authenticated By: Gerrianne Scale, M.D.   Assessment/Plan  1. 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  2. Abnormality of gait Continue use walker  3. Essential hypertension Controlled

## 2015-09-03 ENCOUNTER — Non-Acute Institutional Stay (SKILLED_NURSING_FACILITY): Payer: Medicare Other | Admitting: Nurse Practitioner

## 2015-09-03 ENCOUNTER — Encounter: Payer: Self-pay | Admitting: Nurse Practitioner

## 2015-09-03 DIAGNOSIS — R609 Edema, unspecified: Secondary | ICD-10-CM | POA: Diagnosis not present

## 2015-09-03 DIAGNOSIS — M150 Primary generalized (osteo)arthritis: Secondary | ICD-10-CM | POA: Diagnosis not present

## 2015-09-03 DIAGNOSIS — R269 Unspecified abnormalities of gait and mobility: Secondary | ICD-10-CM | POA: Diagnosis not present

## 2015-09-03 DIAGNOSIS — I10 Essential (primary) hypertension: Secondary | ICD-10-CM

## 2015-09-03 DIAGNOSIS — F02818 Dementia in other diseases classified elsewhere, unspecified severity, with other behavioral disturbance: Secondary | ICD-10-CM

## 2015-09-03 DIAGNOSIS — F0281 Dementia in other diseases classified elsewhere with behavioral disturbance: Secondary | ICD-10-CM

## 2015-09-03 DIAGNOSIS — M159 Polyosteoarthritis, unspecified: Secondary | ICD-10-CM

## 2015-09-03 NOTE — Progress Notes (Signed)
Patient ID: [name], female   DOB: 09/29/1918, 82 y.o.   MRN: [id number]  Location:  [facility] Room Number: 111 Place of Service: SNF [facility] Provider:  Arna Snipe Charlotte Fidalgo NP  GREEN, Lenon Curt, MD  Patient Care Team: [name], MD as PCP - General (Internal Medicine) [facility] Jayliani Wanner X, NP as Nurse Practitioner (Nurse Practitioner)  Extended Emergency Contact Information Primary Emergency Contact: [name] Address: [address]          [place], [place] [address] [place] Home Phone: [phone] Relation: Son Secondary Emergency Contact: [name],[name] Address: [address]          [place], [place] [address] [place] Home Phone: [phone] Relation: Daughter  Code Status:  DNR Goals of care: Advanced Directive information Advanced Directives 09/03/2015  Does patient have an advance directive? Yes  Type of Estate agent of [employer];Out of facility DNR (pink MOST or yellow form)  Does patient want to make changes to advanced directive? No - Patient declined  Copy of advanced directive(s) in chart? Yes  Pre-existing out of facility DNR order (yellow form or pink MOST form) -     Chief Complaint  Patient presents with  . Medical Management of Chronic Issues    HPI:  Pt is a 82 y.o. female seen today for medical management of chronic diseases.  Hx of dementia, taking Aricept, admitted Memory Care unit [facility] and chronic multiple sites arthritis  Past Medical History  Diagnosis Date  . Candidiasis 07/01/2012  . Blood in stool 06/29/2011  . Uterine prolapse without mention of vaginal wall prolapse 06/29/2011  . Abnormality of gait 03/02/2011  . Cholelithiasis 08/02/2010  . Memory loss 02/17/2010  . Lumbago 12/22/2009  . Personal history of fall 12/22/2009  . Disturbance of skin sensation 11/25/2009  . Unspecified vitamin D deficiency 08/19/2009  . Unspecified constipation 09/26/2007  . Other  and unspecified hyperlipidemia 03/28/2007  . Pain in joint, upper arm 10/25/2006  . Abnormal mammogram, unspecified 10/25/2006  . Unspecified essential hypertension 07/19/2006  . Osteoarthrosis, unspecified whether generalized or localized, unspecified site 07/19/2006  . Edema 07/19/2006  . Allergic rhinitis due to pollen 07/13/2006  . Dizziness and giddiness 07/13/2006  . Diverticulitis of colon (without mention of hemorrhage) 10/28/2003   Past Surgical History  Procedure Laterality Date  . Cataract extraction w/ intraocular lens  implant, bilateral Bilateral 1999    Allergies  Allergen Reactions  . Lopid [Gemfibrozil]   . Sulfa Antibiotics   . Zocor [Simvastatin]       Medication List       This list is accurate as of: 09/03/15  2:45 PM.  Always use your most recent med list.               acetaminophen 325 MG tablet  Commonly known as:  TYLENOL  Take 325 mg by mouth. Take one tablet every six hours as needed     ALPRAZolam 0.25 MG tablet  Commonly known as:  XANAX  Take one tablet by mouth every morning before bathing or changing clothes     donepezil 10 MG tablet  Commonly known as:  ARICEPT  One daily to help preserve memory.     TUMS 500 MG chewable tablet  Generic drug:  calcium carbonate  Chew 3 tablets by mouth daily. Take three tablets once daily     Vitamin D 1000 units capsule  Take 1,000 Units  by mouth. Take 2 daily        Review of Systems  Constitutional: Negative for chills and diaphoresis.  HENT: Positive for hearing loss. Negative for congestion, ear pain, nosebleeds and tinnitus.   Eyes: Negative for photophobia, pain and discharge.  Respiratory: Negative for cough, shortness of breath and stridor.   Cardiovascular: Negative for chest pain, palpitations and leg swelling.  Gastrointestinal: Positive for constipation. Negative for nausea, abdominal pain and diarrhea.  Genitourinary:       Incontinent  Musculoskeletal: Negative for myalgias, back  pain and neck pain.  Neurological: Negative for dizziness, tremors, seizures and headaches.  Psychiatric/Behavioral: Negative for hallucinations. The patient is nervous/anxious.     Immunization History  Administered Date(s) Administered  . Influenza Whole 01/16/2012, 02/20/2013  . Influenza-Unspecified 02/19/2014, 01/05/2015  . PPD Test 08/23/2010, 08/04/2015  . Pneumococcal Polysaccharide-23 06/24/1998  . Td 05/02/1974   Pertinent  Health Maintenance Due  Topic Date Due  . DEXA SCAN  03/19/1986  . PNA vac Low Risk Adult (2 of 2 - PCV13) 06/24/1999  . INFLUENZA VACCINE  11/16/2015   Fall Risk  08/06/2015 08/06/2015 11/19/2014 11/19/2014 05/21/2014  Falls in the past year? No No Yes No Yes  Number falls in past yr: - - 1 - -  Injury with Fall? - - No - -   Functional Status Survey:    Filed Vitals:   09/03/15 1047  BP: 128/72  Pulse: 78  Temp: 97.8 F (36.6 C)  TempSrc: Oral  Resp: 22  Height: 5\' 3"  (1.6 m)  Weight: 148 lb 12.8 oz (67.495 kg)   Body mass index is 26.37 kg/(m^2). Physical Exam  Constitutional:  overweight  HENT:  Mild loss of hearing  Eyes:  Corrective lenses.  Neck: No JVD present. No thyromegaly present.  Cardiovascular: Normal rate, regular rhythm, normal heart sounds and intact distal pulses.  Exam reveals no gallop and no friction rub.   No murmur heard. Pulmonary/Chest: Effort normal and breath sounds normal. No respiratory distress. She has no wheezes. She has no rales. She exhibits no tenderness.  Abdominal: Bowel sounds are normal. She exhibits no distension and no mass. There is no tenderness.  Musculoskeletal: Normal range of motion. She exhibits no edema or tenderness.  Unstable gait; using 4 wheel walker.  Lymphadenopathy:    She has no cervical adenopathy.  Neurological: She is alert. No cranial nerve deficit. Coordination normal.  10/23/13 MMSE 17/30. Failed clock drawing.  Skin: No rash noted. No erythema. No pallor.  Psychiatric: She  has a normal mood and affect.    Labs reviewed:  Recent Labs  10/23/14 08/03/15  NA 138 137  K 4.3 4.1  BUN 22* 20  CREATININE 0.9 1.2*    Recent Labs  10/23/14 08/03/15  AST 15 21  ALT 11 17  ALKPHOS 101 80    Recent Labs  10/23/14 08/03/15  WBC 7.1 6.5  HGB 13.2 13.8  HCT 39 40  PLT 249 211   Lab Results  Component Value Date   TSH 2.72 08/04/2015   Lab Results  Component Value Date   HGBA1C 5.7 03/10/2014   Lab Results  Component Value Date   CHOL 297* 04/01/2013   HDL 54 04/01/2013   LDLCALC 200 04/01/2013   TRIG 214* 04/01/2013    Significant Diagnostic Results in last 30 days:  No results found.  Assessment/Plan  Osteoarthritis (arthritis due to wear and tear of joints) Multiple sites, prn Tylenol, ambulates with walker, frequent  resting    Essential hypertension Controlled.    Edema Not apparent today.    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 08/04/15 TSH 2.72, Hgb 13.8, Na 137, K 4.1, Bun 20, creat 0.4   Abnormality of gait Able to ambulate with walker on unit.       Family/ staff Communication:  [facility] Care [facility].   Labs/tests ordered: none

## 2015-09-03 NOTE — Assessment & Plan Note (Signed)
Controlled.  

## 2015-09-03 NOTE — Assessment & Plan Note (Signed)
Able to ambulate with walker on unit.

## 2015-09-03 NOTE — Assessment & Plan Note (Signed)
Not apparent today.   

## 2015-09-03 NOTE — Assessment & Plan Note (Signed)
Multiple sites, prn Tylenol, ambulates with walker, frequent resting   

## 2015-09-03 NOTE — Assessment & Plan Note (Signed)
Gradual decline, close supervision needed, continue  Aricept, SNF for care needs. 08/04/15 TSH 2.72, Hgb 13.8, Na 137, K 4.1, Bun 20, creat 0.4   

## 2015-09-10 ENCOUNTER — Non-Acute Institutional Stay (SKILLED_NURSING_FACILITY): Payer: Medicare Other | Admitting: Internal Medicine

## 2015-09-10 ENCOUNTER — Encounter: Payer: Self-pay | Admitting: Internal Medicine

## 2015-09-10 DIAGNOSIS — W19XXXA Unspecified fall, initial encounter: Secondary | ICD-10-CM

## 2015-09-10 DIAGNOSIS — F0281 Dementia in other diseases classified elsewhere with behavioral disturbance: Secondary | ICD-10-CM

## 2015-09-10 DIAGNOSIS — I10 Essential (primary) hypertension: Secondary | ICD-10-CM

## 2015-09-10 DIAGNOSIS — F02818 Dementia in other diseases classified elsewhere, unspecified severity, with other behavioral disturbance: Secondary | ICD-10-CM

## 2015-09-10 DIAGNOSIS — R269 Unspecified abnormalities of gait and mobility: Secondary | ICD-10-CM

## 2015-09-10 DIAGNOSIS — R609 Edema, unspecified: Secondary | ICD-10-CM

## 2015-09-28 NOTE — Progress Notes (Signed)
Patient ID: [name], female   DOB: 1918/08/13, 82 y.o.   MRN: [id number]    HISTORY AND PHYSICAL  Location:  [facility] Room Number: 110 Place of Service: SNF [phone])   Extended Emergency Contact Information Primary Emergency Contact: Rajkumar,Ron Address: [address]          [place], [place] [address] [place] Phone: [phone] Relation: Son Secondary Emergency Contact: [name],[name] Address: [address]          [place], [place] [address] [place] Phone: [phone] Relation: Daughter  Advanced Directive information Does patient have an advance directive?: Yes, Type of Advance Directive: Healthcare Power of [place];Out of facility DNR (pink MOST or yellow form), Does patient want to make changes to advanced directive?: No - Patient declined  Chief Complaint  Patient presents with  . New Admit To SNF    HPI:  Patient was transferred to SNF/memory care unit on 08/02/2015. Increasing problems of dementia arthritis and incontinence of bowel and bladder justified a higher level of care. She is requiring increased attention from staff to meet her daily needs.  Past Medical History  Diagnosis Date  . Candidiasis 07/01/2012  . Blood in stool 06/29/2011  . Uterine prolapse without mention of vaginal wall prolapse 06/29/2011  . Abnormality of gait 03/02/2011  . Cholelithiasis 08/02/2010  . Memory loss 02/17/2010  . Lumbago 12/22/2009  . Personal history of fall 12/22/2009  . Disturbance of skin sensation 11/25/2009  . Unspecified vitamin D deficiency 08/19/2009  . Unspecified constipation 09/26/2007  . Other and unspecified hyperlipidemia 03/28/2007  . Pain in joint, upper arm 10/25/2006  . Abnormal mammogram, unspecified 10/25/2006  . Unspecified essential hypertension 07/19/2006  . Osteoarthrosis, unspecified whether generalized or localized, unspecified site 07/19/2006  . Edema 07/19/2006  . Allergic rhinitis due to  pollen 07/13/2006  . Dizziness and giddiness 07/13/2006  . Diverticulitis of colon (without mention of hemorrhage) 10/28/2003    Past Surgical History  Procedure Laterality Date  . Cataract extraction w/ intraocular lens  implant, bilateral Bilateral 1999    Patient Care Team: Estill Dooms, MD as PCP - General (Internal Medicine) [place] [facility] Otho Darner, NP as Nurse Practitioner (Nurse Practitioner)  Social History   Social History  . Marital Status: Married    Spouse Name: N/A  . Number of Children: N/A  . Years of Education: N/A   Occupational History  . retired office work    Social History Main Topics  . Smoking status: Never Smoker   . Smokeless tobacco: Never Used  . Alcohol Use: No  . Drug Use: No  . Sexual Activity: No   Other Topics Concern  . Not on file   Social History Narrative   Lives at [facility]   DNR   Living Will   Widowed husband died 2005   Walks with walker    reports that she has never smoked. She has never used smokeless tobacco. She reports that she does not drink alcohol or use illicit drugs.  Family History  Problem Relation Age of Onset  . Heart disease Father     MI   Family Status  Relation Status Death Age  . Mother Deceased 37    cause unknown  . Father Deceased 16  . Sister Alive   . Brother Alive   . Daughter Alive   . Son Alive   . Sister Alive   .  Son Alive   . Son Alive   . Son Alive     Immunization History  Administered Date(s) Administered  . Influenza Whole 01/16/2012, 02/20/2013  . Influenza-Unspecified 02/19/2014, 01/05/2015  . PPD Test 08/23/2010, 08/04/2015  . Pneumococcal Polysaccharide-23 06/24/1998  . Td 05/02/1974    Allergies  Allergen Reactions  . Lopid [Gemfibrozil]   . Sulfa Antibiotics   . Zocor [Simvastatin]     Medications: Patient's Medications  New Prescriptions   No medications on file  Previous Medications   ACETAMINOPHEN (TYLENOL) 325 MG TABLET     Take 325 mg by mouth. Take one tablet every six hours as needed   ALPRAZOLAM (XANAX) 0.25 MG TABLET    Take one tablet by mouth every morning before bathing or changing clothes   CALCIUM CARBONATE (TUMS) 500 MG CHEWABLE TABLET    Chew 3 tablets by mouth daily. Take three tablets once daily   CHOLECALCIFEROL (VITAMIN D) 1000 UNITS CAPSULE    Take 1,000 Units by mouth. Take 2 daily   DONEPEZIL (ARICEPT) 10 MG TABLET    One daily to help preserve memory.  Modified Medications   No medications on file  Discontinued Medications   No medications on file    Review of Systems  Constitutional: Negative for chills, diaphoresis, activity change, appetite change and fatigue.  HENT: Positive for hearing loss. Negative for congestion, ear pain, facial swelling, nosebleeds, postnasal drip, sneezing and tinnitus.   Eyes: Negative for photophobia, pain, discharge, itching and visual disturbance.  Respiratory: Negative for cough, choking, chest tightness, shortness of breath and stridor.   Cardiovascular: Negative for chest pain, palpitations and leg swelling.  Gastrointestinal: Positive for constipation. Negative for nausea, abdominal pain, diarrhea and abdominal distention.  Endocrine: Negative.   Genitourinary:       Incontinent  Musculoskeletal: Positive for arthralgias and gait problem. Negative for myalgias, back pain, joint swelling, neck pain and neck stiffness.  Allergic/Immunologic: Negative.   Neurological: Negative for dizziness, tremors, seizures, syncope, facial asymmetry, speech difficulty, light-headedness, numbness and headaches.  Hematological: Negative.   Psychiatric/Behavioral: Positive for behavioral problems, confusion, decreased concentration and agitation. Negative for hallucinations, sleep disturbance, self-injury and dysphoric mood. The patient is nervous/anxious. The patient is not hyperactive.     Filed Vitals:   09/10/15 1003  BP: 109/52  Pulse: 88  Temp: 97.2 F (36.2 C)    TempSrc: Oral  Resp: 17  Height: '5\' 3"'  (1.6 m)  Weight: 148 lb 12.8 oz (67.495 kg)  SpO2: 95%   Body mass index is 26.37 kg/(m^2). Filed Weights   09/10/15 1003  Weight: 148 lb 12.8 oz (67.495 kg)     Physical Exam  Constitutional:  overweight  HENT:  Mild loss of hearing  Eyes:  Corrective lenses.  Neck: No JVD present. No thyromegaly present.  Cardiovascular: Normal rate, regular rhythm, normal heart sounds and intact distal pulses.  Exam reveals no gallop and no friction rub.   No murmur heard. Pulmonary/Chest: Effort normal and breath sounds normal. No respiratory distress. She has no wheezes. She has no rales. She exhibits no tenderness.  Abdominal: Bowel sounds are normal. She exhibits no distension and no mass. There is no tenderness.  Musculoskeletal: Normal range of motion. She exhibits edema. She exhibits no tenderness.  Unstable gait; using 4 wheel walker.  Lymphadenopathy:    She has no cervical adenopathy.  Neurological: She is alert. No cranial nerve deficit. Coordination normal.  10/23/13 MMSE 17/30. Failed clock drawing.  Skin: No rash  noted. No erythema. No pallor.  Psychiatric: She has a normal mood and affect.    Labs reviewed: Lab Summary Latest Ref Rng 08/03/2015 10/23/2014 03/10/2014  Hemoglobin 12.0 - 16.0 g/dL 13.8 13.2 15.9  Hematocrit 36 - 46 % 40 39 45  White count - 6.5 7.1 8.2  Platelet count 150 - 399 K/L 211 249 259  Sodium 137 - 147 mmol/L 137 138 137  Potassium 3.4 - 5.3 mmol/L 4.1 4.3 4.4  Calcium - (None) (None) (None)  Phosphorus - (None) (None) (None)  Creatinine .5 - 1.1 mg/dL 1.2(A) 0.9 1.0  AST 13 - 35 U/L '21 15 22  ' Alk Phos 25 - 125 U/L 80 101 103  Bilirubin - (None) (None) (None)  Glucose - 78 83 90  Cholesterol - (None) (None) (None)  HDL cholesterol - (None) (None) (None)  Triglycerides - (None) (None) (None)  LDL Direct - (None) (None) (None)  LDL Calc - (None) (None) (None)  Total protein - (None) (None) (None)   Albumin - (None) (None) (None)   Lab Results  Component Value Date   BUN 20 08/03/2015   Lab Results  Component Value Date   HGBA1C 5.7 03/10/2014   Lab Results  Component Value Date   TSH 2.72 08/04/2015    Assessment/Plan  1. 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 Episodes that the patient will benefit from the higher activity level in the memory care unit for the memory impaired.  2. Essential hypertension Currently not treated with any medication and blood pressure remains within normal limits  3. Fall, initial encounter Very unstable gait. Using walker.  4. Abnormality of gait Using walker for unstable gait  5. Edema, unspecified type Chronic edema of the lower legs

## 2015-10-11 ENCOUNTER — Non-Acute Institutional Stay (SKILLED_NURSING_FACILITY): Payer: Medicare Other | Admitting: Internal Medicine

## 2015-10-11 ENCOUNTER — Encounter: Payer: Self-pay | Admitting: Internal Medicine

## 2015-10-11 DIAGNOSIS — R609 Edema, unspecified: Secondary | ICD-10-CM | POA: Diagnosis not present

## 2015-10-11 NOTE — Progress Notes (Signed)
Location[facility] Room Number: N111 Place of Service:  SNF (31) Provider:  Kimber RelicGREEN, Khaya Theissen G, MD  Patient Care Team: Kimber RelicArthur G Patrisia Faeth, MD as PCP - General (Internal Medicine) [facility] Johnney OuX Mast, NP as Nurse Practitioner (Nurse Practitioner)  Extended Emergency Contact Information Primary Emergency Contact: Jerry CarasFoster,Ron Address: [address]          [place], [place] [address] [place] Home Phone: [phone] Relation: Son Secondary Emergency Contact: [name],[name] Address: [address]          [place], [place] [address] [place] Home Phone: [phone] Relation: Daughter  Code Status:  DNR Goals of care: Advanced Directive information Advanced Directives 10/11/2015  Does patient have an advance directive? Yes  Type of Estate agentAdvance Directive Healthcare Power of [place];Living will;Out of facility DNR (pink MOST or yellow form)  Does patient want to make changes to advanced directive? -  Copy of advanced directive(s) in chart? Yes  Pre-existing out of facility DNR order (yellow form or pink MOST form) Yellow form placed in chart (order not valid for inpatient use)     Chief Complaint  Patient presents with  . Acute Visit    left leg has red areas    HPI:  Pt is a 82 y.o. female seen today for an acute visit for increased edema of the lower legs. With the increased swelling, lower legs have appeared slightly more erythematous to staff. There has been no fever. She denies any pain. Denies dyspnea.   Past Medical History  Diagnosis Date  . Candidiasis 07/01/2012  . Blood in stool 06/29/2011  . Uterine prolapse without mention of vaginal wall prolapse 06/29/2011  . Abnormality of gait 03/02/2011  . Cholelithiasis 08/02/2010  . Memory loss 02/17/2010  . Lumbago 12/22/2009  . Personal history of fall 12/22/2009  . Disturbance of skin sensation 11/25/2009  . Unspecified vitamin D deficiency 08/19/2009  . Unspecified constipation 09/26/2007   . Other and unspecified hyperlipidemia 03/28/2007  . Pain in joint, upper arm 10/25/2006  . Abnormal mammogram, unspecified 10/25/2006  . Unspecified essential hypertension 07/19/2006  . Osteoarthrosis, unspecified whether generalized or localized, unspecified site 07/19/2006  . Edema 07/19/2006  . Allergic rhinitis due to pollen 07/13/2006  . Dizziness and giddiness 07/13/2006  . Diverticulitis of colon (without mention of hemorrhage) 10/28/2003   Past Surgical History  Procedure Laterality Date  . Cataract extraction w/ intraocular lens  implant, bilateral Bilateral 1999    Allergies  Allergen Reactions  . Lopid [Gemfibrozil]   . Sulfa Antibiotics   . Zocor [Simvastatin]       Medication List       This list is accurate as of: 10/11/15  4:27 PM.  Always use your most recent med list.               acetaminophen 325 MG tablet  Commonly known as:  TYLENOL  Take 325 mg by mouth. Take one tablet every six hours as needed     ALPRAZolam 0.25 MG tablet  Commonly known as:  XANAX  Take 0.25 mg by mouth. Take one tablet every morning before bathing or changing clothes. Take one tablet every 6 hours as needed for anxiety.     donepezil 10 MG tablet  Commonly known as:  ARICEPT  One daily to help preserve memory.     TUMS 500 MG chewable tablet  Generic drug:  calcium carbonate  Chew 3 tablets by mouth  daily. Take three tablets once daily     VICKS VAPORUB EX  Apply topically. Apply small dab under nose at bedtime as needed for breathing     Vitamin D 1000 units capsule  Take 1,000 Units by mouth. Take 2 daily        Review of Systems  Constitutional: Negative for chills, diaphoresis, activity change, appetite change and fatigue.  HENT: Positive for hearing loss. Negative for congestion, ear pain, facial swelling, nosebleeds, postnasal drip, sneezing and tinnitus.   Eyes: Negative for photophobia, pain, discharge, itching and visual disturbance.  Respiratory: Negative for  cough, choking, chest tightness, shortness of breath and stridor.   Cardiovascular: Positive for leg swelling. Negative for chest pain and palpitations.  Gastrointestinal: Positive for constipation. Negative for nausea, abdominal pain, diarrhea and abdominal distention.  Endocrine: Negative.   Genitourinary:       Incontinent  Musculoskeletal: Positive for arthralgias and gait problem. Negative for myalgias, back pain, joint swelling, neck pain and neck stiffness.  Skin: Positive for color change (Erythema of the distal legs).  Allergic/Immunologic: Negative.   Neurological: Negative for dizziness, tremors, seizures, syncope, facial asymmetry, speech difficulty, light-headedness, numbness and headaches.  Hematological: Negative.   Psychiatric/Behavioral: Positive for behavioral problems, confusion, decreased concentration and agitation. Negative for hallucinations, sleep disturbance, self-injury and dysphoric mood. The patient is nervous/anxious. The patient is not hyperactive.     Immunization History  Administered Date(s) Administered  . Influenza Whole 01/16/2012, 02/20/2013  . Influenza-Unspecified 02/19/2014, 01/05/2015  . PPD Test 08/23/2010, 08/04/2015  . Pneumococcal Polysaccharide-23 06/24/1998  . Td 05/02/1974   Pertinent  Health Maintenance Due  Topic Date Due  . DEXA SCAN  03/19/1986  . PNA vac Low Risk Adult (2 of 2 - PCV13) 06/24/1999  . INFLUENZA VACCINE  11/16/2015   Fall Risk  08/06/2015 08/06/2015 11/19/2014 11/19/2014 05/21/2014  Falls in the past year? No No Yes No Yes  Number falls in past yr: - - 1 - -  Injury with Fall? - - No - -   Functional Status Survey:    Filed Vitals:   10/11/15 1614  BP: 104/64  Pulse: 72  Temp: 97.9 F (36.6 C)  Resp: 18  Height: 5\' 3"  (1.6 m)  Weight: 155 lb (70.308 kg)   Body mass index is 27.46 kg/(m^2). Physical Exam  Constitutional:  overweight  HENT:  Mild loss of hearing  Eyes:  Corrective lenses.  Neck: No JVD  present. No thyromegaly present.  Cardiovascular: Normal rate, regular rhythm, normal heart sounds and intact distal pulses.  Exam reveals no gallop and no friction rub.   No murmur heard. Pulmonary/Chest: Effort normal and breath sounds normal. No respiratory distress. She has no wheezes. She has no rales. She exhibits no tenderness.  Abdominal: Bowel sounds are normal. She exhibits no distension and no mass. There is no tenderness.  Musculoskeletal: Normal range of motion. She exhibits edema. She exhibits no tenderness.  Unstable gait; using 4 wheel walker.  Lymphadenopathy:    She has no cervical adenopathy.  Neurological: She is alert. No cranial nerve deficit. Coordination normal.  10/23/13 MMSE 17/30. Failed clock drawing.  Skin: No rash noted. There is erythema (diatal legs bilaterally). No pallor.  Psychiatric: She has a normal mood and affect.    Labs reviewed:  Recent Labs  10/23/14 08/03/15  NA 138 137  K 4.3 4.1  BUN 22* 20  CREATININE 0.9 1.2*    Recent Labs  10/23/14 08/03/15  AST 15 21  ALT 11 17  ALKPHOS 101 80    Recent Labs  10/23/14 08/03/15  WBC 7.1 6.5  HGB 13.2 13.8  HCT 39 40  PLT 249 211   Lab Results  Component Value Date   TSH 2.72 08/04/2015   Lab Results  Component Value Date   HGBA1C 5.7 03/10/2014   Lab Results  Component Value Date   CHOL 297* 04/01/2013   HDL 54 04/01/2013   LDLCALC 200 04/01/2013   TRIG 214* 04/01/2013    Assessment/Plan 1. Edema, unspecified type Erythematous changes and take her related to chronic venous insufficiency with dependent rubor Currently on furosemide 40 mg daily -Add spironolactone 25 mg daily

## 2015-11-10 LAB — BASIC METABOLIC PANEL
BUN: 22 mg/dL — AB (ref 4–21)
CREATININE: 1.4 mg/dL — AB (ref <=1.1)
Glucose: 95 mg/dL
POTASSIUM: 3.9 mmol/L (ref 3.4–5.3)
Sodium: 140 mmol/L (ref 137–147)
Sodium: 140 mmol/L (ref 137–147)

## 2015-11-12 ENCOUNTER — Encounter: Payer: Self-pay | Admitting: Nurse Practitioner

## 2015-11-12 ENCOUNTER — Other Ambulatory Visit: Payer: Self-pay | Admitting: *Deleted

## 2015-11-12 DIAGNOSIS — N179 Acute kidney failure, unspecified: Secondary | ICD-10-CM | POA: Insufficient documentation

## 2015-11-12 DIAGNOSIS — N189 Chronic kidney disease, unspecified: Secondary | ICD-10-CM | POA: Insufficient documentation

## 2015-11-18 ENCOUNTER — Encounter: Payer: Self-pay | Admitting: Internal Medicine

## 2015-11-22 ENCOUNTER — Non-Acute Institutional Stay (SKILLED_NURSING_FACILITY): Payer: Medicare Other | Admitting: Nurse Practitioner

## 2015-11-22 ENCOUNTER — Encounter: Payer: Self-pay | Admitting: Nurse Practitioner

## 2015-11-22 DIAGNOSIS — R609 Edema, unspecified: Secondary | ICD-10-CM | POA: Diagnosis not present

## 2015-11-22 DIAGNOSIS — N182 Chronic kidney disease, stage 2 (mild): Secondary | ICD-10-CM

## 2015-11-22 DIAGNOSIS — I10 Essential (primary) hypertension: Secondary | ICD-10-CM | POA: Diagnosis not present

## 2015-11-22 DIAGNOSIS — F02818 Dementia in other diseases classified elsewhere, unspecified severity, with other behavioral disturbance: Secondary | ICD-10-CM

## 2015-11-22 DIAGNOSIS — F0281 Dementia in other diseases classified elsewhere with behavioral disturbance: Secondary | ICD-10-CM | POA: Diagnosis not present

## 2015-11-22 NOTE — Progress Notes (Signed)
Location:   [facility] Room Number: N 111 Place of Service:  SNF (31) Provider:  Philena Obey  NP    Patient Care Team: Kimber RelicArthur G Green, MD as PCP - General (Internal Medicine) [facility] Johnney OuX Shaunae Sieloff, NP as Nurse Practitioner (Nurse Practitioner)  Extended Emergency Contact Information Primary Emergency Contact: Jerry CarasFoster,Ron Address: [address]          [place], [place] [address] [place] Home Phone: [phone] Relation: Son Secondary Emergency Contact: [name],[name] Address: [address]          [place], [place] [address] [place] Home Phone: [phone] Relation: Daughter  Code Status:  DNR Goals of care: Advanced Directive information Advanced Directives 11/22/2015  Does patient have an advance directive? Yes  Type of Advance Directive Living will;Healthcare Power of [place];Out of facility DNR (pink MOST or yellow form)  Does patient want to make changes to advanced directive? No - Patient declined  Copy of advanced directive(s) in chart? Yes  Pre-existing out of facility DNR order (yellow form or pink MOST form) -     Chief Complaint  Patient presents with  . Medical Management of Chronic Issues    HPI:  Pt is a 82 y.o. female seen today for medical management of chronic diseases.    Hx of dementia, taking Aricept, [facility] and chronic multiple sites arthritis, chronic edema is managed with daily Furosemide 20mg .   Past Medical History:  Diagnosis Date  . Abnormal mammogram, unspecified 10/25/2006  . Abnormality of gait 03/02/2011  . Allergic rhinitis due to pollen 07/13/2006  . Blood in stool 06/29/2011  . Candidiasis 07/01/2012  . Cholelithiasis 08/02/2010  . Disturbance of skin sensation 11/25/2009  . Diverticulitis of colon (without mention of hemorrhage) 10/28/2003  . Dizziness and giddiness 07/13/2006  . Edema 07/19/2006  . Lumbago 12/22/2009  . Memory loss 02/17/2010  .  Osteoarthrosis, unspecified whether generalized or localized, unspecified site 07/19/2006  . Other and unspecified hyperlipidemia 03/28/2007  . Pain in joint, upper arm 10/25/2006  . Personal history of fall 12/22/2009  . Unspecified constipation 09/26/2007  . Unspecified essential hypertension 07/19/2006  . Unspecified vitamin D deficiency 08/19/2009  . Uterine prolapse without mention of vaginal wall prolapse 06/29/2011   Past Surgical History:  Procedure Laterality Date  . CATARACT EXTRACTION W/ INTRAOCULAR LENS  IMPLANT, BILATERAL Bilateral 1999    Allergies  Allergen Reactions  . Lopid [Gemfibrozil]   . Sulfa Antibiotics   . Zocor [Simvastatin]       Medication List       Accurate as of 11/22/15  4:27 PM. Always use your most recent med list.          acetaminophen 325 MG tablet Commonly known as:  TYLENOL Take 325 mg by mouth. Take one tablet every six hours as needed   donepezil 10 MG tablet Commonly known as:  ARICEPT One daily to help preserve memory.   furosemide 20 MG tablet Commonly known as:  LASIX Take 20 mg by mouth daily.   TUMS 500 MG chewable tablet Generic drug:  calcium carbonate Chew 3 tablets by mouth daily. Take three tablets once daily   Vitamin D 1000 units capsule Take 1,000 Units by mouth. Take 2 daily       Review of Systems  Constitutional: Negative for activity change, appetite change, chills, diaphoresis and fatigue.  HENT: Positive for hearing loss. Negative for congestion, ear  pain, facial swelling, nosebleeds, postnasal drip, sneezing and tinnitus.   Eyes: Negative for photophobia, pain, discharge, itching and visual disturbance.  Respiratory: Negative for cough, choking, chest tightness, shortness of breath and stridor.   Cardiovascular: Positive for leg swelling. Negative for chest pain and palpitations.  Gastrointestinal: Positive for constipation. Negative for abdominal distention, abdominal pain, diarrhea and nausea.  Endocrine:  Negative.   Genitourinary:       Incontinent  Musculoskeletal: Positive for arthralgias and gait problem. Negative for back pain, joint swelling, myalgias, neck pain and neck stiffness.  Skin: Positive for color change (Erythema of the distal legs).  Allergic/Immunologic: Negative.   Neurological: Negative for dizziness, tremors, seizures, syncope, facial asymmetry, speech difficulty, light-headedness, numbness and headaches.  Hematological: Negative.   Psychiatric/Behavioral: Positive for agitation, behavioral problems, confusion and decreased concentration. Negative for dysphoric mood, hallucinations, self-injury and sleep disturbance. The patient is nervous/anxious. The patient is not hyperactive.     Immunization History  Administered Date(s) Administered  . Influenza Whole 01/16/2012, 02/20/2013  . Influenza-Unspecified 02/19/2014, 01/05/2015  . PPD Test 08/23/2010, 08/04/2015  . Pneumococcal Polysaccharide-23 06/24/1998  . Td 05/02/1974   Pertinent  Health Maintenance Due  Topic Date Due  . DEXA SCAN  03/19/1986  . PNA vac Low Risk Adult (2 of 2 - PCV13) 06/24/1999  . INFLUENZA VACCINE  11/16/2015   Fall Risk  08/06/2015 08/06/2015 11/19/2014 11/19/2014 05/21/2014  Falls in the past year? No No Yes No Yes  Number falls in past yr: - - 1 - -  Injury with Fall? - - No - -   Functional Status Survey:    Vitals:   11/22/15 1141  BP: 114/68  Pulse: 74  Resp: 18  Temp: 97 F (36.1 C)  TempSrc: Oral  Weight: 151 lb (68.5 kg)  Height: 5\' 3"  (1.6 m)   Body mass index is 26.75 kg/m. Physical Exam  Constitutional:  overweight  HENT:  Mild loss of hearing  Eyes:  Corrective lenses.  Neck: No JVD present. No thyromegaly present.  Cardiovascular: Normal rate, regular rhythm, normal heart sounds and intact distal pulses.  Exam reveals no gallop and no friction rub.   No murmur heard. Pulmonary/Chest: Effort normal and breath sounds normal. No respiratory distress. She has no  wheezes. She has no rales. She exhibits no tenderness.  Abdominal: Bowel sounds are normal. She exhibits no distension and no mass. There is no tenderness.  Musculoskeletal: Normal range of motion. She exhibits edema. She exhibits no tenderness.  Unstable gait; using 4 wheel walker.  Lymphadenopathy:    She has no cervical adenopathy.  Neurological: She is alert. No cranial nerve deficit. Coordination normal.  10/23/13 MMSE 17/30. Failed clock drawing.  Skin: No rash noted. There is erythema (diatal legs bilaterally). No pallor.  Psychiatric: She has a normal mood and affect.    Labs reviewed:  Recent Labs  08/03/15 11/10/15  NA 137 140  K 4.1 3.9  BUN 20 22*  CREATININE 1.2* 1.4*    Recent Labs  08/03/15  AST 21  ALT 17  ALKPHOS 80    Recent Labs  08/03/15  WBC 6.5  HGB 13.8  HCT 40  PLT 211   Lab Results  Component Value Date   TSH 2.72 08/04/2015   Lab Results  Component Value Date   HGBA1C 5.7 03/10/2014   Lab Results  Component Value Date   CHOL 297 (A) 04/01/2013   HDL 54 04/01/2013   LDLCALC 200 04/01/2013  TRIG 214 (A) 04/01/2013    Significant Diagnostic Results in last 30 days:  No results found.  Assessment/Plan There are no diagnoses linked to this encounter.Essential hypertension Controlled. Continue Furosemide  daily. 11/09/15 Na 136, K 6.3, Bun 42, creat 1.48, 11/10/15 Na 140, K 3.9, Bun 22, creat 1.4   CKD (chronic kidney disease) 11/09/15 Na 136, K 6.3, Bun 42, creat 1.48, 11/10/15 Na 140, K 3.9, Bun 22, creat 1.4    Edema Not apparent today. Continue Furosemide  daily. 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 08/04/15 TSH 2.72, Hgb 13.8, Na 137, K 4.1, Bun 20, creat 0.4       Family/ staff Communication: continue SNF, Memory Care unit, comfort measures.    Labs/tests  ordered:  None

## 2015-11-22 NOTE — Assessment & Plan Note (Signed)
Gradual decline, close supervision needed, continue  Aricept, SNF for care needs. 08/04/15 TSH 2.72, Hgb 13.8, Na 137, K 4.1, Bun 20, creat 0.4

## 2015-11-22 NOTE — Assessment & Plan Note (Addendum)
Not apparent today. Continue Furosemide 20mg  daily. 11/11/15 Na 140, K 3.9, Bun 22, creat 1.44

## 2015-11-22 NOTE — Assessment & Plan Note (Signed)
11/09/15 Na 136, K 6.3, Bun 42, creat 1.48, 11/10/15 Na 140, K 3.9, Bun 22, creat 1.4

## 2015-11-22 NOTE — Assessment & Plan Note (Addendum)
Controlled. Continue Furosemide 20mg daily. 11/09/15 Na 136, K 6.3, Bun 42, creat 1.48, 11/10/15 Na 140, K 3.9, Bun 22, creat 1.4   

## 2015-11-26 ENCOUNTER — Encounter: Payer: Self-pay | Admitting: Nurse Practitioner

## 2015-11-26 ENCOUNTER — Non-Acute Institutional Stay (SKILLED_NURSING_FACILITY): Payer: Medicare Other | Admitting: Nurse Practitioner

## 2015-11-26 DIAGNOSIS — F0281 Dementia in other diseases classified elsewhere with behavioral disturbance: Secondary | ICD-10-CM | POA: Diagnosis not present

## 2015-11-26 DIAGNOSIS — F02818 Dementia in other diseases classified elsewhere, unspecified severity, with other behavioral disturbance: Secondary | ICD-10-CM

## 2015-11-26 DIAGNOSIS — R269 Unspecified abnormalities of gait and mobility: Secondary | ICD-10-CM

## 2015-11-26 DIAGNOSIS — S62306A Unspecified fracture of fifth metacarpal bone, right hand, initial encounter for closed fracture: Secondary | ICD-10-CM

## 2015-11-26 DIAGNOSIS — R609 Edema, unspecified: Secondary | ICD-10-CM | POA: Diagnosis not present

## 2015-11-26 DIAGNOSIS — I10 Essential (primary) hypertension: Secondary | ICD-10-CM

## 2015-11-26 NOTE — Assessment & Plan Note (Signed)
the right hand swelling, warmth, tenderness, bruised, X-ray 11/26/15 showed subacute fracture 5th metacarpal. Tylenol q4h prn available to her. Splint to immobilize the area.

## 2015-11-26 NOTE — Assessment & Plan Note (Signed)
Controlled. Continue Furosemide 20mg  daily. 11/09/15 Na 136, K 6.3, Bun 42, creat 1.48, 11/10/15 Na 140, K 3.9, Bun 22, creat 1.4

## 2015-11-26 NOTE — Assessment & Plan Note (Signed)
Able to ambulate with walker on unit. Risk for falling related to her lack of safety awareness and increased frailty, close supervision needed for safety.

## 2015-11-26 NOTE — Assessment & Plan Note (Signed)
Gradual decline, close supervision needed, continue  Aricept, SNF for care needs. 08/04/15 TSH 2.72, Hgb 13.8, Na 137, K 4.1, Bun 20, creat 0.4   

## 2015-11-26 NOTE — Assessment & Plan Note (Signed)
Not apparent today. Continue Furosemide 20mg daily.   

## 2015-11-26 NOTE — Progress Notes (Signed)
Location:   [facility] Room Number: N 111 Place of Service:  SNF (31) Provider:  Mast, Manxie  NP  [name], MD  Patient Care Team: [name], MD as PCP - General (Internal Medicine) [facility] Man [name], NP as Nurse Practitioner (Nurse Practitioner)  Extended Emergency Contact Information Primary Emergency Contact: [name] Address: [address]          [place], [place] [address] [place] Home Phone: [phone] Relation: Son Secondary Emergency Contact: [name],[name] Address: [address]          [place], [place] [address] [place] Home Phone: [phone] Relation: Daughter  Code Status:  DNR Goals of care: Advanced Directive information Advanced Directives 11/26/2015  Does patient have an advance directive? Yes  Type of Estate agent of [place];Living will;Out of facility DNR (pink MOST or yellow form)  Does patient want to make changes to advanced directive? No - Patient declined  Copy of advanced directive(s) in chart? Yes  Pre-existing out of facility DNR order (yellow form or pink MOST form) -     Chief Complaint  Patient presents with  . Acute Visit    med. hematoma/fx (rt) hand and abrasion on knee    HPI:  Pt is a 82 y.o. female seen today for an acute visit for the right hand swelling, warmth, tenderness, bruised, X-ray 11/26/15 showed subacute fracture 5th metacarpal. Tylenol q4h prn available to her. Splint to immobilize the area.    Hx of dementia, taking Aricept, admitted Memory Care unit [facility] and chronic multiple sites arthritis, chronic edema is managed with daily Furosemide .    Past Medical History:  Diagnosis Date  . Abnormal mammogram, unspecified 10/25/2006  . Abnormality of gait 03/02/2011  . Allergic rhinitis due to pollen 07/13/2006  . Blood in stool 06/29/2011  . Candidiasis 07/01/2012  . Cholelithiasis 08/02/2010  . Disturbance of skin  sensation 11/25/2009  . Diverticulitis of colon (without mention of hemorrhage) 10/28/2003  . Dizziness and giddiness 07/13/2006  . Edema 07/19/2006  . Lumbago 12/22/2009  . Memory loss 02/17/2010  . Osteoarthrosis, unspecified whether generalized or localized, unspecified site 07/19/2006  . Other and unspecified hyperlipidemia 03/28/2007  . Pain in joint, upper arm 10/25/2006  . Personal history of fall 12/22/2009  . Unspecified constipation 09/26/2007  . Unspecified essential hypertension 07/19/2006  . Unspecified vitamin D deficiency 08/19/2009  . Uterine prolapse without mention of vaginal wall prolapse 06/29/2011   Past Surgical History:  Procedure Laterality Date  . CATARACT EXTRACTION W/ INTRAOCULAR LENS  IMPLANT, BILATERAL Bilateral 1999    Allergies  Allergen Reactions  . Lopid [Gemfibrozil]   . Sulfa Antibiotics   . Zocor [Simvastatin]       Medication List       Accurate as of 11/26/15 11:59 PM. Always use your most recent med list.          acetaminophen 325 MG tablet Commonly known as:  TYLENOL Take 325 mg by mouth. Take one tablet every six hours as needed   ALPRAZolam 0.25 MG tablet Commonly known as:  XANAX Take 0.25 mg by mouth. Every morning as needed before bathing and changing clothes.For anxiety   donepezil 10 MG tablet Commonly known as:  ARICEPT One daily to help preserve memory.   furosemide 20 MG tablet Commonly known as:  LASIX Take 20 mg by mouth daily.   TUMS 500 MG chewable tablet Generic drug:  calcium carbonate Chew 3 tablets by mouth daily. Take three tablets once daily   Vitamin D 1000 units capsule Take 1,000 Units by mouth. Take 2 daily       Review of Systems  Constitutional: Negative for activity change, appetite change, chills, diaphoresis and fatigue.  HENT: Positive for hearing loss. Negative for congestion, ear pain, facial swelling, nosebleeds, postnasal drip, sneezing and tinnitus.   Eyes: Negative for photophobia, pain,  discharge, itching and visual disturbance.  Respiratory: Negative for cough, choking, chest tightness, shortness of breath and stridor.   Cardiovascular: Positive for leg swelling. Negative for chest pain and palpitations.  Gastrointestinal: Positive for constipation. Negative for abdominal distention, abdominal pain, diarrhea and nausea.  Endocrine: Negative.   Genitourinary: Negative for dysuria.       Incontinent  Musculoskeletal: Positive for arthralgias and gait problem. Negative for back pain, joint swelling, myalgias, neck pain and neck stiffness.       Right hand pain, swelling, warmth, ecchymoses.   Skin: Positive for color change (Erythema of the distal legs).  Allergic/Immunologic: Negative.   Neurological: Negative for dizziness, tremors, seizures, syncope, facial asymmetry, speech difficulty, light-headedness, numbness and headaches.  Hematological: Negative.   Psychiatric/Behavioral: Positive for agitation, behavioral problems, confusion and decreased concentration. Negative for dysphoric mood, hallucinations, self-injury and sleep disturbance. The patient is nervous/anxious. The patient is not hyperactive.     Immunization History  Administered Date(s) Administered  . Influenza Whole 01/16/2012, 02/20/2013  . Influenza-Unspecified 02/19/2014, 01/05/2015  . PPD Test 08/23/2010, 08/04/2015  . Pneumococcal Polysaccharide-23 06/24/1998  . Td 05/02/1974   Pertinent  Health Maintenance Due  Topic Date Due  . DEXA SCAN  03/19/1986  . PNA vac Low Risk Adult (2 of 2 - PCV13) 06/24/1999  . INFLUENZA VACCINE  11/16/2015   Fall Risk  08/06/2015 08/06/2015 11/19/2014 11/19/2014 05/21/2014  Falls in the past year? No No Yes No Yes  Number falls in past yr: - - 1 - -  Injury with Fall? - - No - -   Functional Status Survey:    Vitals:   11/26/15 1047  BP: 127/76  Pulse: 72  Resp: 18  Temp: 97.4 F (36.3 C)  Weight: 151 lb (68.5 kg)  Height: 5\' 3"  (1.6 m)   Body mass index is  26.75 kg/m. Physical Exam  Constitutional:  overweight  HENT:  Mild loss of hearing  Eyes:  Corrective lenses.  Neck: No JVD present. No thyromegaly present.  Cardiovascular: Normal rate, regular rhythm, normal heart sounds and intact distal pulses.  Exam reveals no gallop and no friction rub.   No murmur heard. Pulmonary/Chest: Effort normal and breath sounds normal. No respiratory distress. She has no wheezes. She has no rales. She exhibits no tenderness.  Abdominal: Bowel sounds are normal. She exhibits no distension and no mass. There is no tenderness.  Musculoskeletal: Normal range of motion. She exhibits edema. She exhibits no tenderness.  Unstable gait; using 4 wheel walker. Right hand swelling, redness, tenderness  Lymphadenopathy:    She has no cervical adenopathy.  Neurological: She is alert. No cranial nerve deficit. Coordination normal.  10/23/13 MMSE 17/30. Failed clock drawing.  Skin: No rash noted. There is erythema (diatal legs bilaterally). No pallor.  Psychiatric: She has a normal mood and affect.    Labs reviewed:  Recent Labs  08/03/15 11/10/15  NA 137 140  K 4.1 3.9  BUN 20 22*  CREATININE 1.2* 1.4*    Recent Labs  08/03/15  AST 21  ALT 17  ALKPHOS 80    Recent Labs  08/03/15  WBC 6.5  HGB 13.8  HCT 40  PLT 211   Lab Results  Component Value Date   TSH 2.72 08/04/2015   Lab Results  Component Value Date   HGBA1C 5.7 03/10/2014   Lab Results  Component Value Date   CHOL 297 (A) 04/01/2013   HDL 54 04/01/2013   LDLCALC 200 04/01/2013   TRIG 214 (A) 04/01/2013    Significant Diagnostic Results in last 30 days:  No results found.  Assessment/Plan There are no diagnoses linked to this encounter.Fracture of fifth metacarpal bone of right hand  the right hand swelling, warmth, tenderness, bruised, X-ray 11/26/15 showed subacute fracture 5th metacarpal. Tylenol q4h prn available to her. Splint to immobilize the area.    Essential  hypertension Controlled. Continue Furosemide 20mg  daily. 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 08/04/15 TSH 2.72, Hgb 13.8, Na 137, K 4.1, Bun 20, creat 0.4     Edema Not apparent today. Continue Furosemide 20mg  daily.     Abnormality of gait Able to ambulate with walker on unit. Risk for falling related to her lack of safety awareness and increased frailty, close supervision needed for safety.      Family/ staff Communication: continue SNF for care assistance   Labs/tests ordered: X-ray R wrist and hand done 11/26/15

## 2015-12-06 ENCOUNTER — Non-Acute Institutional Stay (SKILLED_NURSING_FACILITY): Payer: Medicare Other | Admitting: Nurse Practitioner

## 2015-12-06 ENCOUNTER — Encounter: Payer: Self-pay | Admitting: *Deleted

## 2015-12-06 ENCOUNTER — Encounter: Payer: Self-pay | Admitting: Nurse Practitioner

## 2015-12-06 DIAGNOSIS — R609 Edema, unspecified: Secondary | ICD-10-CM | POA: Diagnosis not present

## 2015-12-06 DIAGNOSIS — I10 Essential (primary) hypertension: Secondary | ICD-10-CM

## 2015-12-06 DIAGNOSIS — F02818 Dementia in other diseases classified elsewhere, unspecified severity, with other behavioral disturbance: Secondary | ICD-10-CM

## 2015-12-06 DIAGNOSIS — J209 Acute bronchitis, unspecified: Secondary | ICD-10-CM | POA: Diagnosis not present

## 2015-12-06 DIAGNOSIS — F0281 Dementia in other diseases classified elsewhere with behavioral disturbance: Secondary | ICD-10-CM

## 2015-12-06 NOTE — Progress Notes (Signed)
Location:    [facility] Room Number: 111 Place of Service:  ALF (13) Provider:  Chipper OmanMast, Manxie  NP   Patient Care Team: Man Johnney OuX Mast, NP as PCP - General (Internal Medicine) [facility] Kimber RelicArthur G Green, MD as Consulting Physician (Internal Medicine)  Extended Emergency Contact Information Primary Emergency Contact: Jerry CarasFoster,Ron Address: [address]          [place], [place] [address] [place] Home Phone: [phone] Relation: Son Secondary Emergency Contact: [name],[name] Address: [address]          [place], [place] [address] [place] Home Phone: [phone] Relation: Daughter  Code Status:  DNR Goals of care: Advanced Directive information Advanced Directives 12/06/2015  Does patient have an advance directive? Yes  Type of Estate agentAdvance Directive Healthcare Power of [place];Living will;Out of facility DNR (pink MOST or yellow form)  Does patient want to make changes to advanced directive? No - Patient declined  Copy of advanced directive(s) in chart? Yes  Pre-existing out of facility DNR order (yellow form or pink MOST form) -     Chief Complaint  Patient presents with  . Acute Visit    Not feeling well, sm.abrasion on underside of right elbow.    HPI:  Pt is a 82 y.o. female seen today for an acute visit for    Past Medical History:  Diagnosis Date  . Abnormal mammogram, unspecified 10/25/2006  . Abnormality of gait 03/02/2011  . Allergic rhinitis due to pollen 07/13/2006  . Blood in stool 06/29/2011  . Candidiasis 07/01/2012  . Cholelithiasis 08/02/2010  . Disturbance of skin sensation 11/25/2009  . Diverticulitis of colon (without mention of hemorrhage) 10/28/2003  . Dizziness and giddiness 07/13/2006  . Edema 07/19/2006  . Lumbago 12/22/2009  . Memory loss 02/17/2010  . Osteoarthrosis, unspecified whether generalized or localized, unspecified site 07/19/2006  . Other and unspecified hyperlipidemia 03/28/2007    . Pain in joint, upper arm 10/25/2006  . Personal history of fall 12/22/2009  . Unspecified constipation 09/26/2007  . Unspecified essential hypertension 07/19/2006  . Unspecified vitamin D deficiency 08/19/2009  . Uterine prolapse without mention of vaginal wall prolapse 06/29/2011   Past Surgical History:  Procedure Laterality Date  . CATARACT EXTRACTION W/ INTRAOCULAR LENS  IMPLANT, BILATERAL Bilateral 1999    Allergies  Allergen Reactions  . Lopid [Gemfibrozil]   . Sulfa Antibiotics   . Zocor [Simvastatin]       Medication List       Accurate as of 12/06/15 10:30 AM. Always use your most recent med list.          acetaminophen 325 MG tablet Commonly known as:  TYLENOL Take 325 mg by mouth. Take one tablet every six hours as needed   ALPRAZolam 0.25 MG tablet Commonly known as:  XANAX Take 0.25 mg by mouth. Every morning before bathing and changing clothes,and  can also take 1 tablet every 6 hours as needed for anxiety.   donepezil 10 MG tablet Commonly known as:  ARICEPT One daily to help preserve memory.   furosemide 20 MG tablet Commonly known as:  LASIX Take 20 mg by mouth daily.   TUMS 500 MG chewable tablet Generic drug:  calcium carbonate Chew 3 tablets by mouth daily. Take three tablets once daily   Vicks Vaporub 4.73-1.2-2.6 % Oint Apply small dab under nose at bedtime as needed for breathing.   Vitamin D 1000 units capsule Take 1,000 Units  by mouth. Take 2 daily       Review of Systems  Immunization History  Administered Date(s) Administered  . Influenza Whole 01/16/2012, 02/20/2013  . Influenza-Unspecified 02/19/2014, 01/05/2015  . PPD Test 08/23/2010, 08/04/2015  . Pneumococcal Polysaccharide-23 06/24/1998  . Td 05/02/1974   Pertinent  Health Maintenance Due  Topic Date Due  . DEXA SCAN  03/19/1986  . PNA vac Low Risk Adult (2 of 2 - PCV13) 06/24/1999  . INFLUENZA VACCINE  11/16/2015   Fall Risk  08/06/2015 08/06/2015 11/19/2014 11/19/2014  05/21/2014  Falls in the past year? No No Yes No Yes  Number falls in past yr: - - 1 - -  Injury with Fall? - - No - -   Functional Status Survey:    Vitals:   12/06/15 1015  BP: 120/72  Pulse: 74  Resp: 18  Temp: (!) 96.5 F (35.8 C)  SpO2: 98%  Weight: 151 lb (68.5 kg)  Height: 5\' 3"  (1.6 m)   Body mass index is 26.75 kg/m. Physical Exam  Labs reviewed:  Recent Labs  08/03/15 11/10/15  NA 137 140  K 4.1 3.9  BUN 20 22*  CREATININE 1.2* 1.4*    Recent Labs  08/03/15  AST 21  ALT 17  ALKPHOS 80    Recent Labs  08/03/15  WBC 6.5  HGB 13.8  HCT 40  PLT 211   Lab Results  Component Value Date   TSH 2.72 08/04/2015   Lab Results  Component Value Date   HGBA1C 5.7 03/10/2014   Lab Results  Component Value Date   CHOL 297 (A) 04/01/2013   HDL 54 04/01/2013   LDLCALC 200 04/01/2013   TRIG 214 (A) 04/01/2013    Significant Diagnostic Results in last 30 days:  No results found.  Assessment/Plan There are no diagnoses linked to this encounter.   Family/ staff Communication:   Labs/tests ordered:    This encounter was created in error - please disregard.

## 2015-12-06 NOTE — Assessment & Plan Note (Signed)
Gradual decline, close supervision needed, continue  Aricept, SNF for care needs

## 2015-12-06 NOTE — Assessment & Plan Note (Signed)
Not apparent today. Continue Furosemide 20mg daily.   

## 2015-12-06 NOTE — Assessment & Plan Note (Signed)
Controlled. Continue Furosemide 20mg  daily. 11/09/15 Na 136, K 6.3, Bun 42, creat 1.48, 11/10/15 Na 140, K 3.9, Bun 22, creat 1.4 Pending CMP

## 2015-12-06 NOTE — Progress Notes (Signed)
Location:   [facility] Room Number: 111 Place of Service:  ALF (13) Provider:  Daveion Robar, Manxie  NP  Eldra Word X, NP  Patient Care Team: Keighley Deckman X Joselynn Amoroso, NP as PCP - General (Internal Medicine) [facility] Kimber RelicArthur G Green, MD as Consulting Physician (Internal Medicine)  Extended Emergency Contact Information Primary Emergency Contact: Jerry CarasFoster,Ron Address: [address]          [place], [place] [address] [place] Home Phone: [phone] Relation: Son Secondary Emergency Contact: [name],[name] Address: [address]          [place], [place] [address] [place] Home Phone: [phone] Relation: Daughter  Code Status:  DNR Goals of care: Advanced Directive information Advanced Directives 12/06/2015  Does patient have an advance directive? Yes  Type of Advance Directive Living will;Healthcare Power of [place];Out of facility DNR (pink MOST or yellow form)  Does patient want to make changes to advanced directive? No - Patient declined  Copy of advanced directive(s) in chart? Yes  Pre-existing out of facility DNR order (yellow form or pink MOST form) -     Chief Complaint  Patient presents with  . Acute Visit    Patient stated not feeling well on 8/20, small abrasion on (rt) underside of elbow.    HPI:  Pt is a 82 y.o. female seen today for an acute visit for O2 desaturation, O2 2lpm [place] needed to maintain Sat O2 >90%, cough and fever, improving on 7 day course of Aveloxe, 5 day course of Mucinex, Atrovent neb. CXR showed no acute cardiopulmonary findings.      Hx of dementia, taking Aricept, [facility], chronic multiple sites arthritis, ambulates with walker on unit,  chronic edema is managed with daily Furosemide 20mg .   Past Medical History:  Diagnosis Date  . Abnormal mammogram, unspecified 10/25/2006  . Abnormality of gait 03/02/2011  . Allergic rhinitis due to pollen 07/13/2006  . Blood in stool  06/29/2011  . Candidiasis 07/01/2012  . Cholelithiasis 08/02/2010  . Disturbance of skin sensation 11/25/2009  . Diverticulitis of colon (without mention of hemorrhage) 10/28/2003  . Dizziness and giddiness 07/13/2006  . Edema 07/19/2006  . Lumbago 12/22/2009  . Memory loss 02/17/2010  . Osteoarthrosis, unspecified whether generalized or localized, unspecified site 07/19/2006  . Other and unspecified hyperlipidemia 03/28/2007  . Pain in joint, upper arm 10/25/2006  . Personal history of fall 12/22/2009  . Unspecified constipation 09/26/2007  . Unspecified essential hypertension 07/19/2006  . Unspecified vitamin D deficiency 08/19/2009  . Uterine prolapse without mention of vaginal wall prolapse 06/29/2011   Past Surgical History:  Procedure Laterality Date  . CATARACT EXTRACTION W/ INTRAOCULAR LENS  IMPLANT, BILATERAL Bilateral 1999    Allergies  Allergen Reactions  . Lopid [Gemfibrozil]   . Sulfa Antibiotics   . Zocor [Simvastatin]       Medication List       Accurate as of 12/06/15 12:26 PM. Always use your most recent med list.          acetaminophen 325 MG tablet Commonly known as:  TYLENOL Take 325 mg by mouth. Take one tablet every six hours as needed   ALPRAZolam 0.25 MG tablet Commonly known as:  XANAX Take 0.25 mg by mouth. Every morning before bathing and changing clothes,and  can also take 1 tablet every 6 hours as needed for anxiety.   donepezil 10 MG tablet Commonly known as:  ARICEPT One  daily to help preserve memory.   furosemide 20 MG tablet Commonly known as:  LASIX Take 20 mg by mouth daily.   TUMS 500 MG chewable tablet Generic drug:  calcium carbonate Chew 3 tablets by mouth daily. Take three tablets once daily   Vicks Vaporub 4.73-1.2-2.6 % Oint Apply small dab under nose at bedtime as needed for breathing.   Vitamin D 1000 units capsule Take 1,000 Units by mouth. Take 2 daily       Review of Systems  Constitutional: Positive for fatigue and fever.  Negative for activity change, appetite change, chills and diaphoresis.  HENT: Positive for hearing loss. Negative for congestion, ear pain, facial swelling, nosebleeds, postnasal drip, sneezing and tinnitus.   Eyes: Negative for photophobia, pain, discharge, itching and visual disturbance.  Respiratory: Positive for cough. Negative for choking, chest tightness, shortness of breath and stridor.   Cardiovascular: Positive for leg swelling. Negative for chest pain and palpitations.  Gastrointestinal: Positive for constipation. Negative for abdominal distention, abdominal pain, diarrhea and nausea.  Endocrine: Negative.   Genitourinary: Negative for dysuria.       Incontinent  Musculoskeletal: Positive for arthralgias and gait problem. Negative for back pain, joint swelling, myalgias, neck pain and neck stiffness.       Right hand pain, swelling, warmth, ecchymoses.   Skin: Positive for color change (Erythema of the distal legs).  Allergic/Immunologic: Negative.   Neurological: Negative for dizziness, tremors, seizures, syncope, facial asymmetry, speech difficulty, light-headedness, numbness and headaches.  Hematological: Negative.   Psychiatric/Behavioral: Positive for agitation, behavioral problems, confusion and decreased concentration. Negative for dysphoric mood, hallucinations, self-injury and sleep disturbance. The patient is nervous/anxious. The patient is not hyperactive.     Immunization History  Administered Date(s) Administered  . Influenza Whole 01/16/2012, 02/20/2013  . Influenza-Unspecified 02/19/2014, 01/05/2015  . PPD Test 08/23/2010, 08/04/2015  . Pneumococcal Polysaccharide-23 06/24/1998  . Td 05/02/1974   Pertinent  Health Maintenance Due  Topic Date Due  . DEXA SCAN  03/19/1986  . PNA vac Low Risk Adult (2 of 2 - PCV13) 06/24/1999  . INFLUENZA VACCINE  11/16/2015   Fall Risk  08/06/2015 08/06/2015 11/19/2014 11/19/2014 05/21/2014  Falls in the past year? No No Yes No Yes    Number falls in past yr: - - 1 - -  Injury with Fall? - - No - -   Functional Status Survey:    Vitals:   12/06/15 1039  BP: 120/72  Pulse: 74  Resp: 18  Temp: (!) 96.5 F (35.8 C)  SpO2: 98%  Weight: 151 lb (68.5 kg)  Height: 5\' 3"  (1.6 m)   Body mass index is 26.75 kg/m. Physical Exam  Constitutional:  overweight  HENT:  Mild loss of hearing  Eyes:  Corrective lenses.  Neck: No JVD present. No thyromegaly present.  Cardiovascular: Normal rate, regular rhythm, normal heart sounds and intact distal pulses.  Exam reveals no gallop and no friction rub.   No murmur heard. Pulmonary/Chest: Effort normal and breath sounds normal. No respiratory distress. She has no wheezes. She has no rales. She exhibits no tenderness.  Abdominal: Bowel sounds are normal. She exhibits no distension and no mass. There is no tenderness.  Musculoskeletal: Normal range of motion. She exhibits edema. She exhibits no tenderness.  Unstable gait; using 4 wheel walker. Right hand swelling, redness, tenderness  Lymphadenopathy:    She has no cervical adenopathy.  Neurological: She is alert. No cranial nerve deficit. Coordination normal.  10/23/13 MMSE 17/30. Failed  clock drawing.  Skin: No rash noted. There is erythema (diatal legs bilaterally). No pallor.  Small abrasion right elbow.   Psychiatric: She has a normal mood and affect.    Labs reviewed:  Recent Labs  08/03/15 11/10/15  NA 137 140  K 4.1 3.9  BUN 20 22*  CREATININE 1.2* 1.4*    Recent Labs  08/03/15  AST 21  ALT 17  ALKPHOS 80    Recent Labs  08/03/15  WBC 6.5  HGB 13.8  HCT 40  PLT 211   Lab Results  Component Value Date   TSH 2.72 08/04/2015   Lab Results  Component Value Date   HGBA1C 5.7 03/10/2014   Lab Results  Component Value Date   CHOL 297 (A) 04/01/2013   HDL 54 04/01/2013   LDLCALC 200 04/01/2013   TRIG 214 (A) 04/01/2013    Significant Diagnostic Results in last 30 days:  No results  found.  Assessment/Plan There are no diagnoses linked to this encounter. Acute bronchitis 12/06/15 CXR no acute cardiopulmonary disease is seen Pending CBC, CMP, UA C/S, complete 7 day course of Avelox 400mg  daily, 5 day Mucinex 600mg  bid, 5 day Atrovent neb q6h prn  Essential hypertension Controlled. Continue Furosemide 20mg  daily. 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 Continue Furosemide 20mg  daily.       Family/ staff Communication:   Labs/tests ordered:  12/06/15 CXR done, pending CBC, CMP, UA C/S

## 2015-12-06 NOTE — Assessment & Plan Note (Signed)
12/06/15 CXR no acute cardiopulmonary disease is seen Pending CBC, CMP, UA C/S, complete 7 day course of Avelox 400mg  daily, 5 day Mucinex 600mg  bid, 5 day Atrovent neb q6h prn

## 2015-12-09 ENCOUNTER — Other Ambulatory Visit: Payer: Self-pay | Admitting: *Deleted

## 2015-12-13 ENCOUNTER — Non-Acute Institutional Stay (SKILLED_NURSING_FACILITY): Payer: Medicare Other | Admitting: Nurse Practitioner

## 2015-12-13 ENCOUNTER — Encounter: Payer: Self-pay | Admitting: Nurse Practitioner

## 2015-12-13 DIAGNOSIS — J209 Acute bronchitis, unspecified: Secondary | ICD-10-CM | POA: Diagnosis not present

## 2015-12-13 DIAGNOSIS — M150 Primary generalized (osteo)arthritis: Secondary | ICD-10-CM | POA: Diagnosis not present

## 2015-12-13 DIAGNOSIS — F0281 Dementia in other diseases classified elsewhere with behavioral disturbance: Secondary | ICD-10-CM

## 2015-12-13 DIAGNOSIS — I10 Essential (primary) hypertension: Secondary | ICD-10-CM

## 2015-12-13 DIAGNOSIS — M159 Polyosteoarthritis, unspecified: Secondary | ICD-10-CM

## 2015-12-13 DIAGNOSIS — S62306D Unspecified fracture of fifth metacarpal bone, right hand, subsequent encounter for fracture with routine healing: Secondary | ICD-10-CM | POA: Diagnosis not present

## 2015-12-13 DIAGNOSIS — R609 Edema, unspecified: Secondary | ICD-10-CM

## 2015-12-13 DIAGNOSIS — F02818 Dementia in other diseases classified elsewhere, unspecified severity, with other behavioral disturbance: Secondary | ICD-10-CM

## 2015-12-13 NOTE — Assessment & Plan Note (Addendum)
12/06/15 CXR no acute cardiopulmonary disease is seen Completed 7 day course of Avelox 400mg  daily, 5 day Mucinex 600mg  bid, 5 day Atrovent neb q6h prn 12/13/15 occasional hacking cough, no O2 desaturation, afebrile, non productive, no wheezes, baseline posterior bibasilar rales. Will continue to observe the patient.

## 2015-12-13 NOTE — Assessment & Plan Note (Addendum)
Resolved the right hand swelling, warmth, mild tenderness the right lateral of palm. X-ray 11/26/15 showed subacute fracture 5th metacarpal. Tylenol q4h prn available to her. Continue splint to immobilize the area.

## 2015-12-13 NOTE — Assessment & Plan Note (Signed)
Gradual decline, close supervision needed, continue  Aricept, SNF for care needs 

## 2015-12-13 NOTE — Assessment & Plan Note (Signed)
Not apparent today. Continue Furosemide 20mg daily.   

## 2015-12-13 NOTE — Assessment & Plan Note (Signed)
Controlled. Continue Furosemide 20mg daily. 11/09/15 Na 136, K 6.3, Bun 42, creat 1.48, 11/10/15 Na 140, K 3.9, Bun 22, creat 1.4   

## 2015-12-13 NOTE — Assessment & Plan Note (Signed)
Multiple sites, prn Tylenol, ambulates with walker, frequent resting   

## 2015-12-13 NOTE — Progress Notes (Signed)
Location:   [facility] Room Number: 111 Place of Service:  ALF [phone]) Provider:  Jamilex Bohnsack, Manxie  NP  Murray HodgkinsArthur Green, MD  Patient Care Team: Kimber RelicArthur G Green, MD as PCP - General (Internal Medicine) [facility] Kimber RelicArthur G Green, MD as Consulting Physician (Internal Medicine) Anirudh Baiz Johnney OuX Jadesola Poynter, NP as Nurse Practitioner (Internal Medicine)  Extended Emergency Contact Information Primary Emergency Contact: Jerry CarasFoster,Ron Address: [address]          [place], [place] [address] [place] Home Phone: [phone] Relation: Son Secondary Emergency Contact: [name],[name] Address: [address]          [place], [place] [address] [place] Home Phone: [phone] Relation: Daughter  Code Status:  DNR Goals of care: Advanced Directive information Advanced Directives 12/13/2015  Does patient have an advance directive? Yes  Type of Advance Directive Living will;Out of facility DNR (pink MOST or yellow form);Healthcare Power of Attorney  Does patient want to make changes to advanced directive? No - Patient declined  Copy of advanced directive(s) in chart? Yes  Pre-existing out of facility DNR order (yellow form or pink MOST form) -     Chief Complaint  Patient presents with  . Acute Visit    Fx. (Rt) hand f/u    HPI:  Pt is a 82 y.o. female seen today for an acute visit for s/p metacarpal fx, not compliant with splint using, healing nicely-no pain, swelling, or bruise.     X-ray 11/26/15 showed subacute fracture 5th metacarpal. Tylenol q4h prn available to her. Splint to immobilize the area.    Hx of dementia, taking Aricept, [facility], chronic multiple sites arthritis, ambulates with walker on unit,  chronic edema is managed with daily Furosemide 20mg .   Past Medical History:  Diagnosis Date  . Abnormal mammogram, unspecified 10/25/2006  . Abnormality of gait 03/02/2011  . Allergic rhinitis due to pollen 07/13/2006    . Blood in stool 06/29/2011  . Candidiasis 07/01/2012  . Cholelithiasis 08/02/2010  . Disturbance of skin sensation 11/25/2009  . Diverticulitis of colon (without mention of hemorrhage) 10/28/2003  . Dizziness and giddiness 07/13/2006  . Edema 07/19/2006  . Lumbago 12/22/2009  . Memory loss 02/17/2010  . Osteoarthrosis, unspecified whether generalized or localized, unspecified site 07/19/2006  . Other and unspecified hyperlipidemia 03/28/2007  . Pain in joint, upper arm 10/25/2006  . Personal history of fall 12/22/2009  . Unspecified constipation 09/26/2007  . Unspecified essential hypertension 07/19/2006  . Unspecified vitamin D deficiency 08/19/2009  . Uterine prolapse without mention of vaginal wall prolapse 06/29/2011   Past Surgical History:  Procedure Laterality Date  . CATARACT EXTRACTION W/ INTRAOCULAR LENS  IMPLANT, BILATERAL Bilateral 1999    Allergies  Allergen Reactions  . Lopid [Gemfibrozil]   . Sulfa Antibiotics   . Zocor [Simvastatin]       Medication List       Accurate as of 12/13/15 11:59 PM. Always use your most recent med list.          acetaminophen 325 MG tablet Commonly known as:  TYLENOL Take 325 mg by mouth. Take one tablet every six hours as needed   ALPRAZolam 0.25 MG tablet Commonly known as:  XANAX Take 0.25 mg by mouth. Every morning before bathing and changing clothes,and  can also take 1 tablet every 6 hours as needed for anxiety.   donepezil 10 MG tablet Commonly known as:  ARICEPT One daily  to help preserve memory.   furosemide 20 MG tablet Commonly known as:  LASIX Take 20 mg by mouth daily.   TUMS 500 MG chewable tablet Generic drug:  calcium carbonate Chew 3 tablets by mouth daily. Take three tablets once daily   Vicks Vaporub 4.73-1.2-2.6 % Oint Apply small dab under nose at bedtime as needed for breathing.   Vitamin D 1000 units capsule Take 1,000 Units by mouth. Take 2 daily       Review of Systems  Constitutional: Positive for  fatigue and fever. Negative for activity change, appetite change, chills and diaphoresis.  HENT: Positive for hearing loss. Negative for congestion, ear pain, facial swelling, nosebleeds, postnasal drip, sneezing and tinnitus.   Eyes: Negative for photophobia, pain, discharge, itching and visual disturbance.  Respiratory: Positive for cough. Negative for choking, chest tightness, shortness of breath and stridor.        Occasional non productive cough.   Cardiovascular: Positive for leg swelling. Negative for chest pain and palpitations.  Gastrointestinal: Positive for constipation. Negative for abdominal distention, abdominal pain, diarrhea and nausea.  Endocrine: Negative.   Genitourinary: Negative for dysuria.       Incontinent  Musculoskeletal: Positive for arthralgias and gait problem. Negative for back pain, joint swelling, myalgias, neck pain and neck stiffness.       Right hand pain, swelling, warmth, ecchymoses.   Skin: Positive for color change (Erythema of the distal legs).  Allergic/Immunologic: Negative.   Neurological: Negative for dizziness, tremors, seizures, syncope, facial asymmetry, speech difficulty, light-headedness, numbness and headaches.  Hematological: Negative.   Psychiatric/Behavioral: Positive for agitation, behavioral problems, confusion and decreased concentration. Negative for dysphoric mood, hallucinations, self-injury and sleep disturbance. The patient is nervous/anxious. The patient is not hyperactive.     Immunization History  Administered Date(s) Administered  . Influenza Whole 01/16/2012, 02/20/2013  . Influenza-Unspecified 02/19/2014, 01/05/2015  . PPD Test 08/23/2010, 08/04/2015  . Pneumococcal Polysaccharide-23 06/24/1998  . Td 05/02/1974   Pertinent  Health Maintenance Due  Topic Date Due  . DEXA SCAN  03/19/1986  . PNA vac Low Risk Adult (2 of 2 - PCV13) 06/24/1999  . INFLUENZA VACCINE  11/16/2015   Fall Risk  08/06/2015 08/06/2015 11/19/2014  11/19/2014 05/21/2014  Falls in the past year? No No Yes No Yes  Number falls in past yr: - - 1 - -  Injury with Fall? - - No - -   Functional Status Survey:    Vitals:   12/13/15 1437  BP: 120/60  Pulse: 70  Resp: 20  Temp: 98.1 F (36.7 C)  Weight: 151 lb (68.5 kg)  Height: 5\' 3"  (1.6 m)   Body mass index is 26.75 kg/m. Physical Exam  Constitutional:  overweight  HENT:  Mild loss of hearing  Eyes:  Corrective lenses.  Neck: No JVD present. No thyromegaly present.  Cardiovascular: Normal rate, regular rhythm, normal heart sounds and intact distal pulses.  Exam reveals no gallop and no friction rub.   No murmur heard. Pulmonary/Chest: Effort normal. No respiratory distress. She has no wheezes. She has rales. She exhibits no tenderness.  Posterior bibasilar rales.   Abdominal: Bowel sounds are normal. She exhibits no distension and no mass. There is no tenderness.  Musculoskeletal: Normal range of motion. She exhibits edema. She exhibits no tenderness.  Unstable gait; using 4 wheel walker. Right hand swelling, redness, tenderness  Lymphadenopathy:    She has no cervical adenopathy.  Neurological: She is alert. No cranial nerve deficit. Coordination normal.  10/23/13 MMSE 17/30. Failed clock drawing.  Skin: No rash noted. There is erythema (diatal legs bilaterally). No pallor.  Small abrasion right elbow.   Psychiatric: She has a normal mood and affect.    Labs reviewed:  Recent Labs  08/03/15 11/10/15  NA 137 140  140  K 4.1 3.9  BUN 20 22*  CREATININE 1.2* 1.4*    Recent Labs  08/03/15  AST 21  ALT 17  ALKPHOS 80    Recent Labs  08/03/15  WBC 6.5  HGB 13.8  HCT 40  PLT 211   Lab Results  Component Value Date   TSH 2.72 08/04/2015   Lab Results  Component Value Date   HGBA1C 5.7 03/10/2014   Lab Results  Component Value Date   CHOL 297 (A) 04/01/2013   HDL 54 04/01/2013   LDLCALC 200 04/01/2013   TRIG 214 (A) 04/01/2013    Significant  Diagnostic Results in last 30 days:  No results found.  Assessment/Plan There are no diagnoses linked to this encounter.Essential hypertension Controlled. Continue Furosemide 20mg  daily. 11/09/15 Na 136, K 6.3, Bun 42, creat 1.48, 11/10/15 Na 140, K 3.9, Bun 22, creat 1.4   Acute bronchitis 12/06/15 CXR no acute cardiopulmonary disease is seen Completed 7 day course of Avelox 400mg  daily, 5 day Mucinex 600mg  bid, 5 day Atrovent neb q6h prn 12/13/15 occasional hacking cough, no O2 desaturation, afebrile, non productive, no wheezes, baseline posterior bibasilar rales. Will continue to observe the patient.    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 X-ray 11/26/15 showed subacute fracture 5th metacarpal. Tylenol q4h prn available to her. Continue splint to immobilize the area.     Edema Not apparent today. Continue Furosemide 20mg  daily.         Family/ staff Communication: continue SNF, Memory Care Unit for care assistance.   Labs/tests ordered:  none

## 2015-12-24 ENCOUNTER — Encounter: Payer: Self-pay | Admitting: Nurse Practitioner

## 2015-12-24 ENCOUNTER — Non-Acute Institutional Stay (SKILLED_NURSING_FACILITY): Payer: Medicare Other | Admitting: Nurse Practitioner

## 2015-12-24 DIAGNOSIS — R609 Edema, unspecified: Secondary | ICD-10-CM

## 2015-12-24 DIAGNOSIS — N182 Chronic kidney disease, stage 2 (mild): Secondary | ICD-10-CM

## 2015-12-24 DIAGNOSIS — F0281 Dementia in other diseases classified elsewhere with behavioral disturbance: Secondary | ICD-10-CM | POA: Diagnosis not present

## 2015-12-24 DIAGNOSIS — J209 Acute bronchitis, unspecified: Secondary | ICD-10-CM | POA: Diagnosis not present

## 2015-12-24 DIAGNOSIS — F02818 Dementia in other diseases classified elsewhere, unspecified severity, with other behavioral disturbance: Secondary | ICD-10-CM

## 2015-12-24 NOTE — Progress Notes (Signed)
Location:   [facility] Room Number: 111 Place of Service:  ALF [phone]) Provider:  [name], Manxie  NP  [name], MD  Patient Care Team: [name], MD as PCP - General (Internal Medicine) [facility] [name], MD as Consulting Physician (Internal Medicine) [name] [name], NP as Nurse Practitioner (Internal Medicine)  Extended Emergency Contact Information Primary Emergency Contact: [name] Address: [address]          [place], [place] [address] [place] Home Phone: [phone] Relation: Son Secondary Emergency Contact: [name],[name] Address: [address]          [place], [place] [address] [place] Home Phone: [phone] Relation: Daughter  Code Status:  DNR Goals of care: Advanced Directive information Advanced Directives 12/24/2015  Does patient have an advance directive? Yes  Type of Estate agent of Justice;Living will;Out of facility DNR (pink MOST or yellow form)  Does patient want to make changes to advanced directive? No - Patient declined  Copy of advanced directive(s) in chart? Yes  Pre-existing out of facility DNR order (yellow form or pink MOST form) -     Chief Complaint  Patient presents with  . Acute Visit    Moist non-productive cough, nasal congestion    HPI:  Pt is a 82 y.o. Curry seen today for an acute visit for cough, nasal congestion, denied chest pain, afebrile, no O2 desaturation.    X-ray 11/26/15 showed subacute fracture 5th metacarpal. Tylenol q4h prn available to her. Splint to immobilize the area.    Hx of dementia, taking Aricept, resides Memory Care unit [facility], chronic multiple sites arthritis, ambulates with walker on unit, chronic edema is managed with daily Furosemide 20mg .   Past Medical History:  Diagnosis Date  . Abnormal mammogram, unspecified 10/25/2006  . Abnormality of gait 03/02/2011  . Allergic rhinitis due to pollen 07/13/2006   . Blood in stool 06/29/2011  . Candidiasis 07/01/2012  . Cholelithiasis 08/02/2010  . Disturbance of skin sensation 11/25/2009  . Diverticulitis of colon (without mention of hemorrhage) 10/28/2003  . Dizziness and giddiness 07/13/2006  . Edema 07/19/2006  . Lumbago 12/22/2009  . Memory loss 02/17/2010  . Osteoarthrosis, unspecified whether generalized or localized, unspecified site 07/19/2006  . Other and unspecified hyperlipidemia 03/28/2007  . Pain in joint, upper arm 10/25/2006  . Personal history of fall 12/22/2009  . Unspecified constipation 09/26/2007  . Unspecified essential hypertension 07/19/2006  . Unspecified vitamin D deficiency 08/19/2009  . Uterine prolapse without mention of vaginal wall prolapse 06/29/2011   Past Surgical History:  Procedure Laterality Date  . CATARACT EXTRACTION W/ INTRAOCULAR LENS  IMPLANT, BILATERAL Bilateral 1999    Allergies  Allergen Reactions  . Lopid [Gemfibrozil]   . Sulfa Antibiotics   . Zocor [Simvastatin]       Medication List       Accurate as of 12/24/15  5:12 PM. Always use your most recent med list.          acetaminophen 325 MG tablet Commonly known as:  TYLENOL Take 325 mg by mouth. Take one tablet every six hours as needed   donepezil 10 MG tablet Commonly known as:  ARICEPT One daily to help preserve memory.   furosemide 20 MG tablet Commonly known as:  LASIX Take 20 mg by mouth daily.   TUMS 500 MG chewable tablet Generic drug:  calcium carbonate Chew 3 tablets by mouth daily. Take three  tablets once daily   Vitamin D 1000 units capsule Take 1,000 Units by mouth. Take 2 daily       Review of Systems  Constitutional: Positive for fatigue and fever. Negative for activity change, appetite change, chills and diaphoresis.  HENT: Positive for congestion and hearing loss. Negative for ear pain, facial swelling, nosebleeds, postnasal drip, sneezing and tinnitus.   Eyes: Negative for photophobia, pain, discharge, itching and  visual disturbance.  Respiratory: Positive for cough. Negative for choking, chest tightness, shortness of breath and stridor.        Occasional non productive cough.   Cardiovascular: Positive for leg swelling. Negative for chest pain and palpitations.  Gastrointestinal: Positive for constipation. Negative for abdominal distention, abdominal pain, diarrhea and nausea.  Endocrine: Negative.   Genitourinary: Negative for dysuria.       Incontinent  Musculoskeletal: Positive for arthralgias and gait problem. Negative for back pain, joint swelling, myalgias, neck pain and neck stiffness.       Right hand pain, swelling, warmth, ecchymoses.   Skin: Positive for color change (Erythema of the distal legs).  Allergic/Immunologic: Negative.   Neurological: Negative for dizziness, tremors, seizures, syncope, facial asymmetry, speech difficulty, light-headedness, numbness and headaches.  Hematological: Negative.   Psychiatric/Behavioral: Positive for agitation, behavioral problems, confusion and decreased concentration. Negative for dysphoric mood, hallucinations, self-injury and sleep disturbance. The patient is nervous/anxious. The patient is not hyperactive.     Immunization History  Administered Date(s) Administered  . Influenza Whole 01/16/2012, 02/20/2013  . Influenza-Unspecified 02/19/2014, 01/05/2015  . PPD Test 08/23/2010, 08/04/2015  . Pneumococcal Polysaccharide-23 06/24/1998  . Td 05/02/1974   Pertinent  Health Maintenance Due  Topic Date Due  . DEXA SCAN  03/19/1986  . PNA vac Low Risk Adult (2 of 2 - PCV13) 06/24/1999  . INFLUENZA VACCINE  11/16/2015   Fall Risk  08/06/2015 08/06/2015 11/19/2014 11/19/2014 05/21/2014  Falls in the past year? No No Yes No Yes  Number falls in past yr: - - 1 - -  Injury with Fall? - - No - -   Functional Status Survey:    Vitals:   12/24/15 1336  BP: 124/72  Pulse: 70  Resp: 20  Temp: 97.2 F (36.2 C)  SpO2: 98%  Weight: 151 lb (68.5 kg)    Height: 5\' 5"  (1.651 m)   Body mass index is 25.13 kg/m. Physical Exam  Constitutional:  overweight  HENT:  Mild loss of hearing  Eyes:  Corrective lenses.  Neck: No JVD present. No thyromegaly present.  Cardiovascular: Normal rate, regular rhythm, normal heart sounds and intact distal pulses.  Exam reveals no gallop and no friction rub.   No murmur heard. Pulmonary/Chest: Effort normal. No respiratory distress. She has no wheezes. She has rales. She exhibits no tenderness.  Posterior bibasilar rales. Coarse rhonchi posterior mid to lower lungs.   Abdominal: Bowel sounds are normal. She exhibits no distension and no mass. There is no tenderness.  Musculoskeletal: Normal range of motion. She exhibits edema. She exhibits no tenderness.  Unstable gait; using 4 wheel walker. Right hand swelling, redness, tenderness  Lymphadenopathy:    She has no cervical adenopathy.  Neurological: She is alert. No cranial nerve deficit. Coordination normal.  10/23/13 MMSE 17/30. Failed clock drawing.  Skin: No rash noted. There is erythema (diatal legs bilaterally). No pallor.  Small abrasion right elbow.   Psychiatric: She has a normal mood and affect.    Labs reviewed:  Recent Labs  08/03/15 11/10/15  NA 137 140  140  K 4.1 3.9  BUN 20 22*  CREATININE 1.2* 1.4*    Recent Labs  08/03/15  AST 21  ALT 17  ALKPHOS 80    Recent Labs  08/03/15  WBC 6.5  HGB 13.8  HCT 40  PLT 211   Lab Results  Component Value Date   TSH 2.72 08/04/2015   Lab Results  Component Value Date   HGBA1C 5.7 03/10/2014   Lab Results  Component Value Date   CHOL 297 (A) 04/01/2013   HDL 54 04/01/2013   LDLCALC 200 04/01/2013   TRIG 214 (A) 04/01/2013    Significant Diagnostic Results in last 30 days:  No results found.  Assessment/Plan There are no diagnoses linked to this encounter.Acute bronchitis 12/06/15 CXR no acute cardiopulmonary disease is seen Completed 7 day course of Avelox 400mg   daily, 5 day Mucinex 600mg  bid, 5 day Atrovent neb q6h prn 12/13/15 occasional hacking cough, no O2 desaturation, afebrile, non productive, no wheezes, baseline posterior bibasilar rales. Will continue to observe the patient.  12/24/15 nasal congestion, non productive cough, afebrile, no O2 desaturation, coarse rhonchi posterior mid to lower lungs, continue Claritin 10mg  daily, Mucinex 600mg  bid x 5 days, adding Z-pk and Medrol dose pk. Observe the patient.     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 Continue Furosemide 20mg  daily.       Family/ staff Communication:  Continue SNF for care assistance.   Labs/tests ordered:  none

## 2015-12-24 NOTE — Assessment & Plan Note (Signed)
11/09/15 Na 136, K 6.3, Bun 42, creat 1.48, 11/10/15 Na 140, K 3.9, Bun 22, creat 1.4   

## 2015-12-24 NOTE — Assessment & Plan Note (Signed)
Not apparent today. Continue Furosemide 20mg daily.   

## 2015-12-24 NOTE — Assessment & Plan Note (Signed)
Gradual decline, close supervision needed, continue  Aricept, SNF for care needs 

## 2015-12-24 NOTE — Assessment & Plan Note (Addendum)
12/06/15 CXR no acute cardiopulmonary disease is seen Completed 7 day course of Avelox 400mg  daily, 5 day Mucinex 600mg  bid, 5 day Atrovent neb q6h prn 12/13/15 occasional hacking cough, no O2 desaturation, afebrile, non productive, no wheezes, baseline posterior bibasilar rales. Will continue to observe the patient.  12/24/15 nasal congestion, non productive cough, afebrile, no O2 desaturation, coarse rhonchi posterior mid to lower lungs, continue Claritin 10mg  daily, Mucinex 600mg  bid x 5 days, adding Z-pk and Medrol dose pk. Observe the patient.

## 2016-01-17 ENCOUNTER — Encounter: Payer: Self-pay | Admitting: Internal Medicine

## 2016-01-17 ENCOUNTER — Non-Acute Institutional Stay (SKILLED_NURSING_FACILITY): Payer: Medicare Other | Admitting: Internal Medicine

## 2016-01-17 DIAGNOSIS — F0281 Dementia in other diseases classified elsewhere with behavioral disturbance: Secondary | ICD-10-CM | POA: Diagnosis not present

## 2016-01-17 DIAGNOSIS — I10 Essential (primary) hypertension: Secondary | ICD-10-CM

## 2016-01-17 DIAGNOSIS — J209 Acute bronchitis, unspecified: Secondary | ICD-10-CM | POA: Diagnosis not present

## 2016-01-17 DIAGNOSIS — F02818 Dementia in other diseases classified elsewhere, unspecified severity, with other behavioral disturbance: Secondary | ICD-10-CM

## 2016-01-17 DIAGNOSIS — S62306D Unspecified fracture of fifth metacarpal bone, right hand, subsequent encounter for fracture with routine healing: Secondary | ICD-10-CM

## 2016-01-17 NOTE — Progress Notes (Signed)
Progress Note    Location:  [facility] Room Number: [phone] Place of Service:  SNF [phone]) Provider:   [name], MD  Patient Care Team: [name], MD as PCP - General (Internal Medicine) [facility] [name], MD as Consulting Physician (Internal Medicine) Man [name], NP as Nurse Practitioner (Internal Medicine)  Extended Emergency Contact Information Primary Emergency Contact: [facility] Address: [address]          [place], [place] [address] [place] Home Phone: [phone] Relation: Son Secondary Emergency Contact: [name],[name] Address: [address]          [place], [place] [address] [place] Home Phone: [phone] Relation: Daughter  Code Status:  DNR Goals of care: Advanced Directive information Advanced Directives 01/17/2016  Does patient have an advance directive? Yes  Type of Estate agent of [place];Living will  Does patient want to make changes to advanced directive? -  Copy of advanced directive(s) in chart? Yes  Pre-existing out of facility DNR order (yellow form or pink MOST form) Yellow form placed in chart (order not valid for inpatient use)     Chief Complaint  Patient presents with  . Medical Management of Chronic Issues    routine    HPI:  Pt is a 82 y.o. female seen today for medical management of chronic diseases.    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 No longer having pain.     Past Medical History:  Diagnosis Date  . Abnormal mammogram, unspecified 10/25/2006  . Abnormality of gait 03/02/2011  . Allergic  rhinitis due to pollen 07/13/2006  . Blood in stool 06/29/2011  . Candidiasis 07/01/2012  . Cholelithiasis 08/02/2010  . Disturbance of skin sensation 11/25/2009  . Diverticulitis of colon (without mention of hemorrhage)(562.11) 10/28/2003  . Dizziness and giddiness 07/13/2006  . Edema 07/19/2006  . Lumbago 12/22/2009  . Memory loss 02/17/2010  . Osteoarthrosis, unspecified whether generalized or localized, unspecified site 07/19/2006  . Other and unspecified hyperlipidemia 03/28/2007  . Pain in joint, upper arm 10/25/2006  . Personal history of fall 12/22/2009  . Unspecified constipation 09/26/2007  . Unspecified essential hypertension 07/19/2006  . Unspecified vitamin D deficiency 08/19/2009  . Uterine prolapse without mention of vaginal wall prolapse 06/29/2011   Past Surgical History:  Procedure Laterality Date  . CATARACT EXTRACTION W/ INTRAOCULAR LENS  IMPLANT, BILATERAL Bilateral 1999    Allergies  Allergen Reactions  . Lopid [Gemfibrozil]   . Sulfa Antibiotics   . Zocor [Simvastatin]       Medication List       Accurate as of 01/17/16  3:00 PM. Always use your most recent med list.          acetaminophen 325 MG tablet Commonly known as:  TYLENOL Take 325 mg by mouth. Take two tablets every 4 hours as needed for pain   donepezil 10 MG tablet Commonly known as:  ARICEPT One daily to help preserve memory.   furosemide 20 MG tablet Commonly known as:  LASIX Take 20 mg by mouth daily.   TUMS 500 MG chewable tablet Generic drug:  calcium carbonate Chew 3 tablets by mouth  daily. Take three tablets once daily   Vitamin D 1000 units capsule Take 1,000 Units by mouth. Take 2 daily       Review of Systems  Constitutional: Negative for activity change, appetite change, chills, diaphoresis and fatigue.  HENT: Positive for hearing loss. Negative for congestion, ear pain, facial swelling, nosebleeds, postnasal drip, sneezing and tinnitus.   Eyes: Negative for photophobia, pain,  discharge, itching and visual disturbance.  Respiratory: Negative for cough, choking, chest tightness, shortness of breath and stridor.   Cardiovascular: Positive for leg swelling. Negative for chest pain and palpitations.  Gastrointestinal: Positive for constipation. Negative for abdominal distention, abdominal pain, diarrhea and nausea.  Endocrine: Negative.   Genitourinary:       Incontinent  Musculoskeletal: Positive for arthralgias and gait problem. Negative for back pain, joint swelling, myalgias, neck pain and neck stiffness.       Hx fx right 5th MC  Skin: Positive for color change (Erythema of the distal legs).  Allergic/Immunologic: Negative.   Neurological: Negative for dizziness, tremors, seizures, syncope, facial asymmetry, speech difficulty, light-headedness, numbness and headaches.  Hematological: Negative.   Psychiatric/Behavioral: Positive for agitation, behavioral problems, confusion and decreased concentration. Negative for dysphoric mood, hallucinations, self-injury and sleep disturbance. The patient is nervous/anxious. The patient is not hyperactive.     Immunization History  Administered Date(s) Administered  . Influenza Whole 01/16/2012, 02/20/2013  . Influenza-Unspecified 02/19/2014, 01/05/2015  . PPD Test 08/23/2010, 08/04/2015  . Pneumococcal Polysaccharide-23 06/24/1998  . Td 05/02/1974   Pertinent  Health Maintenance Due  Topic Date Due  . DEXA SCAN  03/19/1986  . PNA vac Low Risk Adult (2 of 2 - PCV13) 06/24/1999  . INFLUENZA VACCINE  11/16/2015   Fall Risk  08/06/2015 08/06/2015 11/19/2014 11/19/2014 05/21/2014  Falls in the past year? No No Yes No Yes  Number falls in past yr: - - 1 - -  Injury with Fall? - - No - -   Functional Status Survey:    Vitals:   01/17/16 1453  BP: 112/78  Pulse: 80  Resp: (!) 22  Temp: 97.6 F (36.4 C)  Weight: 152 lb (68.9 kg)  Height: 5\' 3"  (1.6 m)   Body mass index is 26.93 kg/m. Physical Exam  Constitutional:    overweight  HENT:  Mild loss of hearing  Eyes:  Corrective lenses.  Neck: No JVD present. No thyromegaly present.  Cardiovascular: Normal rate, regular rhythm, normal heart sounds and intact distal pulses.  Exam reveals no gallop and no friction rub.   No murmur heard. Pulmonary/Chest: Effort normal and breath sounds normal. No respiratory distress. She has no wheezes. She has no rales. She exhibits no tenderness.  Abdominal: Bowel sounds are normal. She exhibits no distension and no mass. There is no tenderness.  Musculoskeletal: Normal range of motion. She exhibits edema. She exhibits no tenderness.  Unstable gait; using 4 wheel walker. May not need splint on the right wrist any more  Lymphadenopathy:    She has no cervical adenopathy.  Neurological: She is alert. No cranial nerve deficit. Coordination normal.  10/23/13 MMSE 17/30. Failed clock drawing.  Skin: No rash noted. There is erythema (diatal legs bilaterally). No pallor.  Psychiatric: She has a normal mood and affect.    Labs reviewed:  Recent Labs  08/03/15 11/10/15  NA 137 140  140  K 4.1 3.9  BUN 20 22*  CREATININE 1.2* 1.4*    Recent Labs  08/03/15  AST 21  ALT 17  ALKPHOS 80    Recent Labs  08/03/15  WBC 6.5  HGB 13.8  HCT 40  PLT 211   Lab Results  Component Value Date   TSH 2.72 08/04/2015   Lab Results  Component Value Date   HGBA1C 5.7 03/10/2014   Lab Results  Component Value Date   CHOL 297 (A) 04/01/2013   HDL 54 04/01/2013   LDLCALC 200 04/01/2013   TRIG 214 (A) 04/01/2013    Significant Diagnostic Results in last 30 days:  No results found.  Assessment/Plan  1. 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 Essential hypertension controlled  3. Acute bronchitis, unspecified organism resolved  4. Closed nondisplaced fracture of fifth metacarpal bone of right hand with routine healing, unspecified portion of metacarpal, subsequent  encounter -consider stopping the splint

## 2016-02-21 ENCOUNTER — Non-Acute Institutional Stay (SKILLED_NURSING_FACILITY): Payer: Medicare Other | Admitting: Nurse Practitioner

## 2016-02-21 ENCOUNTER — Encounter: Payer: Self-pay | Admitting: Nurse Practitioner

## 2016-02-21 DIAGNOSIS — M150 Primary generalized (osteo)arthritis: Secondary | ICD-10-CM | POA: Diagnosis not present

## 2016-02-21 DIAGNOSIS — F0281 Dementia in other diseases classified elsewhere with behavioral disturbance: Secondary | ICD-10-CM | POA: Diagnosis not present

## 2016-02-21 DIAGNOSIS — R269 Unspecified abnormalities of gait and mobility: Secondary | ICD-10-CM

## 2016-02-21 DIAGNOSIS — R609 Edema, unspecified: Secondary | ICD-10-CM | POA: Diagnosis not present

## 2016-02-21 DIAGNOSIS — F02818 Dementia in other diseases classified elsewhere, unspecified severity, with other behavioral disturbance: Secondary | ICD-10-CM

## 2016-02-21 DIAGNOSIS — M159 Polyosteoarthritis, unspecified: Secondary | ICD-10-CM

## 2016-02-21 DIAGNOSIS — N181 Chronic kidney disease, stage 1: Secondary | ICD-10-CM

## 2016-02-21 DIAGNOSIS — I10 Essential (primary) hypertension: Secondary | ICD-10-CM

## 2016-02-21 NOTE — Progress Notes (Signed)
Location:  [facility] Room Number: [phone] Place of Service:  SNF (31) Provider: Fernando Torry Manxie  NP  [name], MD  Patient Care Team: [name], MD as PCP - General (Internal Medicine) [facility] [name], MD as Consulting Physician (Internal Medicine) Ayiana Winslett [name], NP as Nurse Practitioner (Internal Medicine)  Extended Emergency Contact Information Primary Emergency Contact: [facility] Address: [address]          [place], [place] [address] [place] Home Phone: [phone] Relation: Son Secondary Emergency Contact: [name],[name] Address: [address]          [place], [place] [address] [place] Home Phone: [phone] Relation: Daughter  Code Status:  DNR Goals of care: Advanced Directive information Advanced Directives 02/21/2016  Does patient have an advance directive? Yes  Type of Estate agent of [place];Living will  Does patient want to make changes to advanced directive? No - Patient declined  Copy of advanced directive(s) in chart? Yes  Pre-existing out of facility DNR order (yellow form or pink MOST form) -     Chief Complaint  Patient presents with  . Medical Management of Chronic Issues    HPI:  Pt is a 82 y.o. female seen today for medical management of chronic diseases.    X-ray 11/26/15 showed subacute fracture 5th metacarpal. Tylenol q4h prn available to her. Healed.               Hx of dementia, taking Aricept, resides Memory Care unit [facility], chronic multiple sites arthritis, ambulates with walker on unit, chronic edema is managed with daily Furosemide 20mg .   Past Medical History:  Diagnosis Date  . Abnormal mammogram, unspecified 10/25/2006  . Abnormality of gait 03/02/2011  . Allergic rhinitis due to pollen 07/13/2006  . Blood in stool 06/29/2011  . Candidiasis 07/01/2012  . Cholelithiasis 08/02/2010  . Disturbance of skin sensation 11/25/2009  .  Diverticulitis of colon (without mention of hemorrhage)(562.11) 10/28/2003  . Dizziness and giddiness 07/13/2006  . Edema 07/19/2006  . Lumbago 12/22/2009  . Memory loss 02/17/2010  . Osteoarthrosis, unspecified whether generalized or localized, unspecified site 07/19/2006  . Other and unspecified hyperlipidemia 03/28/2007  . Pain in joint, upper arm 10/25/2006  . Personal history of fall 12/22/2009  . Unspecified constipation 09/26/2007  . Unspecified essential hypertension 07/19/2006  . Unspecified vitamin D deficiency 08/19/2009  . Uterine prolapse without mention of vaginal wall prolapse 06/29/2011   Past Surgical History:  Procedure Laterality Date  . CATARACT EXTRACTION W/ INTRAOCULAR LENS  IMPLANT, BILATERAL Bilateral 1999    Allergies  Allergen Reactions  . Lopid [Gemfibrozil]   . Sulfa Antibiotics   . Zocor [Simvastatin]       Medication List       Accurate as of 02/21/16  2:16 PM. Always use your most recent med list.          acetaminophen 325 MG tablet Commonly known as:  TYLENOL Take 325 mg by mouth every 6 (six) hours as needed for mild pain. Take two tablets every 4 hours as needed for pain   donepezil 10 MG tablet Commonly known as:  ARICEPT One daily to help preserve memory.   furosemide 20 MG tablet Commonly known as:  LASIX Take 20 mg by mouth daily.   TUMS 500 MG chewable tablet Generic drug:  calcium carbonate Chew 3 tablets by mouth daily. Take three tablets once daily   Vitamin  D 1000 units capsule Take 1,000 Units by mouth. Take 2 daily       Review of Systems  Constitutional: Negative for activity change, appetite change, chills, diaphoresis and fatigue.  HENT: Positive for hearing loss. Negative for congestion, ear pain, facial swelling, nosebleeds, postnasal drip, sneezing and tinnitus.   Eyes: Negative for photophobia, pain, discharge, itching and visual disturbance.  Respiratory: Negative for cough, choking, chest tightness, shortness of breath  and stridor.   Cardiovascular: Positive for leg swelling. Negative for chest pain and palpitations.  Gastrointestinal: Positive for constipation. Negative for abdominal distention, abdominal pain, diarrhea and nausea.  Endocrine: Negative.   Genitourinary:       Incontinent  Musculoskeletal: Positive for arthralgias and gait problem. Negative for back pain, joint swelling, myalgias, neck pain and neck stiffness.       Hx fx right 5th MC  Skin: Negative for color change (Erythema of the distal legs).  Allergic/Immunologic: Negative.   Neurological: Negative for dizziness, tremors, seizures, syncope, facial asymmetry, speech difficulty, light-headedness, numbness and headaches.  Hematological: Negative.   Psychiatric/Behavioral: Positive for agitation, behavioral problems, confusion and decreased concentration. Negative for dysphoric mood, hallucinations, self-injury and sleep disturbance. The patient is nervous/anxious. The patient is not hyperactive.     Immunization History  Administered Date(s) Administered  . Influenza Whole 01/16/2012, 02/20/2013  . Influenza-Unspecified 02/19/2014, 01/05/2015, 02/01/2016  . PPD Test 08/23/2010, 08/04/2015  . Pneumococcal Polysaccharide-23 06/24/1998  . Td 05/02/1974   Pertinent  Health Maintenance Due  Topic Date Due  . DEXA SCAN  03/19/1986  . PNA vac Low Risk Adult (2 of 2 - PCV13) 06/24/1999  . INFLUENZA VACCINE  Completed   Fall Risk  08/06/2015 08/06/2015 11/19/2014 11/19/2014 05/21/2014  Falls in the past year? No No Yes No Yes  Number falls in past yr: - - 1 - -  Injury with Fall? - - No - -   Functional Status Survey:    Vitals:   02/21/16 1026  BP: 122/78  Pulse: 76  Resp: 20  Temp: 97.6 F (36.4 C)  Weight: 150 lb 12.8 oz (68.4 kg)  Height: 5\' 3"  (1.6 m)   Body mass index is 26.71 kg/m. Physical Exam  Constitutional:  overweight  HENT:  Mild loss of hearing  Eyes:  Corrective lenses.  Neck: No JVD present. No thyromegaly  present.  Cardiovascular: Normal rate, regular rhythm, normal heart sounds and intact distal pulses.  Exam reveals no gallop and no friction rub.   No murmur heard. Pulmonary/Chest: Effort normal and breath sounds normal. No respiratory distress. She has no wheezes. She has no rales. She exhibits no tenderness.  Abdominal: Bowel sounds are normal. She exhibits no distension and no mass. There is no tenderness.  Musculoskeletal: Normal range of motion. She exhibits edema. She exhibits no tenderness.  Unstable gait; using 4 wheel walker.   Lymphadenopathy:    She has no cervical adenopathy.  Neurological: She is alert. No cranial nerve deficit. Coordination normal.  10/23/13 MMSE 17/30. Failed clock drawing.  Skin: No rash noted. No erythema (diatal legs bilaterally). No pallor.  Psychiatric: She has a normal mood and affect.    Labs reviewed:  Recent Labs  08/03/15 11/10/15  NA 137 140  140  K 4.1 3.9  BUN 20 22*  CREATININE 1.2* 1.4*    Recent Labs  08/03/15  AST 21  ALT 17  ALKPHOS 80    Recent Labs  08/03/15  WBC 6.5  HGB 13.8  HCT  40  PLT 211   Lab Results  Component Value Date   TSH 2.72 08/04/2015   Lab Results  Component Value Date   HGBA1C 5.7 03/10/2014   Lab Results  Component Value Date   CHOL 297 (A) 04/01/2013   HDL 54 04/01/2013   LDLCALC 200 04/01/2013   TRIG 214 (A) 04/01/2013    Significant Diagnostic Results in last 30 days:  No results found.  Assessment/Plan Essential hypertension Controlled. Continue Furosemide 20mg  daily. 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 Risk for falling related to her lack of safety awareness and increased frailty, close supervision needed for safety.    CKD (chronic kidney disease) 11/09/15 Na 136, K 6.3, Bun 42, creat 1.48, 11/10/15 Na 140, K 3.9, Bun 22, creat 1.4    Edema Not apparent today. Continue Furosemide 20mg  daily.      Family/ staff Communication:continue SNF MCU for care assistance.   Labs/tests ordered:  none

## 2016-02-21 NOTE — Assessment & Plan Note (Signed)
Able to ambulate with walker on unit. Risk for falling related to her lack of safety awareness and increased frailty, close supervision needed for safety.  

## 2016-02-21 NOTE — Assessment & Plan Note (Signed)
Gradual decline, close supervision needed, continue  Aricept, SNF for care needs 

## 2016-02-21 NOTE — Assessment & Plan Note (Signed)
Controlled. Continue Furosemide 20mg daily. 11/09/15 Na 136, K 6.3, Bun 42, creat 1.48, 11/10/15 Na 140, K 3.9, Bun 22, creat 1.4   

## 2016-02-21 NOTE — Assessment & Plan Note (Signed)
Multiple sites, prn Tylenol, ambulates with walker, frequent resting

## 2016-02-21 NOTE — Assessment & Plan Note (Signed)
11/09/15 Na 136, K 6.3, Bun 42, creat 1.48, 11/10/15 Na 140, K 3.9, Bun 22, creat 1.4   

## 2016-02-21 NOTE — Assessment & Plan Note (Signed)
Not apparent today. Continue Furosemide 20mg daily.   

## 2016-03-13 ENCOUNTER — Encounter: Payer: Self-pay | Admitting: Nurse Practitioner

## 2016-03-13 ENCOUNTER — Non-Acute Institutional Stay (SKILLED_NURSING_FACILITY): Payer: Medicare Other | Admitting: Nurse Practitioner

## 2016-03-13 DIAGNOSIS — F0281 Dementia in other diseases classified elsewhere with behavioral disturbance: Secondary | ICD-10-CM

## 2016-03-13 DIAGNOSIS — I10 Essential (primary) hypertension: Secondary | ICD-10-CM | POA: Diagnosis not present

## 2016-03-13 DIAGNOSIS — R609 Edema, unspecified: Secondary | ICD-10-CM

## 2016-03-13 DIAGNOSIS — W19XXXA Unspecified fall, initial encounter: Secondary | ICD-10-CM

## 2016-03-13 DIAGNOSIS — F02818 Dementia in other diseases classified elsewhere, unspecified severity, with other behavioral disturbance: Secondary | ICD-10-CM

## 2016-03-13 NOTE — Assessment & Plan Note (Signed)
03/12/16 x1, no apparent injury. Lack of safety awareness and increased frailty contributory. Close supervision for safety.

## 2016-03-13 NOTE — Assessment & Plan Note (Signed)
Not apparent today. Continue Furosemide 20mg  daily.

## 2016-03-13 NOTE — Assessment & Plan Note (Signed)
Controlled. Continue Furosemide 20mg daily. 11/09/15 Na 136, K 6.3, Bun 42, creat 1.48, 11/10/15 Na 140, K 3.9, Bun 22, creat 1.4   

## 2016-03-13 NOTE — Progress Notes (Signed)
Location:  [facility] Room Number: 111 Place of Service:  SNF (31) Provider:  Mast, Manxie NP  [name], MD  Patient Care Team: [name], MD as PCP - General (Internal Medicine) [facility] [name], MD as Consulting Physician (Internal Medicine) Man [name], NP as Nurse Practitioner (Internal Medicine)  Extended Emergency Contact Information Primary Emergency Contact: [facility] Address: [address]          [place], [place] [address] [place] Home Phone: [phone] Relation: Son Secondary Emergency Contact: [name],[name] Address: [address]          [place], [place] [address] [place] Home Phone: [phone] Relation: Daughter  Code Status:  DNR Goals of care: Advanced Directive information Advanced Directives 03/13/2016  Does Patient Have a Medical Advance Directive? Yes  Type of Estate agent of [place];Living will  Does patient want to make changes to medical advance directive? No - Patient declined  Copy of Healthcare Power of Attorney in Chart? Yes  Pre-existing out of facility DNR order (yellow form or pink MOST form) -     Chief Complaint  Patient presents with  . Acute Visit    Increased behaviors, confusion, combative with staff    HPI:  Pt is a 82 y.o. female seen today for an acute visit for confusion, combative, irritable, malaise. Denied pain or discomfort upon my visit.   Hx of dementia, taking Aricept, resides Memory Care unit [facility], chronic multiple sites arthritis, ambulates with walker on unit, chronic edema is managed with daily Furosemide 20mg .  Past Medical History:  Diagnosis Date  . Abnormal mammogram, unspecified 10/25/2006  . Abnormality of gait 03/02/2011  . Allergic rhinitis due to pollen 07/13/2006  . Blood in stool 06/29/2011  . Candidiasis 07/01/2012  . Cholelithiasis 08/02/2010  . Disturbance of skin sensation 11/25/2009  .  Diverticulitis of colon (without mention of hemorrhage)(562.11) 10/28/2003  . Dizziness and giddiness 07/13/2006  . Edema 07/19/2006  . Lumbago 12/22/2009  . Memory loss 02/17/2010  . Osteoarthrosis, unspecified whether generalized or localized, unspecified site 07/19/2006  . Other and unspecified hyperlipidemia 03/28/2007  . Pain in joint, upper arm 10/25/2006  . Personal history of fall 12/22/2009  . Unspecified constipation 09/26/2007  . Unspecified essential hypertension 07/19/2006  . Unspecified vitamin D deficiency 08/19/2009  . Uterine prolapse without mention of vaginal wall prolapse 06/29/2011   Past Surgical History:  Procedure Laterality Date  . CATARACT EXTRACTION W/ INTRAOCULAR LENS  IMPLANT, BILATERAL Bilateral 1999    Allergies  Allergen Reactions  . Lopid [Gemfibrozil]   . Sulfa Antibiotics   . Zocor [Simvastatin]       Medication List       Accurate as of 03/13/16 11:59 PM. Always use your most recent med list.          acetaminophen 325 MG tablet Commonly known as:  TYLENOL Take 325 mg by mouth every 6 (six) hours as needed for mild pain. Take two tablets every 4 hours as needed for pain   donepezil 10 MG tablet Commonly known as:  ARICEPT One daily to help preserve memory.   furosemide 20 MG tablet Commonly known as:  LASIX Take 20 mg by mouth daily.   loratadine 10 MG tablet Commonly known as:  CLARITIN Take 10 mg by mouth daily.   TUMS 500 MG chewable tablet Generic drug:  calcium carbonate Chew 3 tablets by mouth daily. Take three  tablets once daily   Vitamin D 1000 units capsule Take 1,000 Units by mouth. Take 2 daily       Review of Systems  Constitutional: Positive for activity change, appetite change and fatigue. Negative for chills and diaphoresis.  HENT: Positive for hearing loss. Negative for congestion, ear pain, facial swelling, nosebleeds, postnasal drip, sneezing and tinnitus.   Eyes: Negative for photophobia, pain, discharge, itching and  visual disturbance.  Respiratory: Negative for cough, choking, chest tightness, shortness of breath and stridor.   Cardiovascular: Positive for leg swelling. Negative for chest pain and palpitations.  Gastrointestinal: Positive for constipation. Negative for abdominal distention, abdominal pain, diarrhea and nausea.  Endocrine: Negative.   Genitourinary:       Incontinent  Musculoskeletal: Positive for arthralgias and gait problem. Negative for back pain, joint swelling, myalgias, neck pain and neck stiffness.       Hx fx right 5th MC  Skin: Negative for color change (Erythema of the distal legs).  Allergic/Immunologic: Negative.   Neurological: Negative for dizziness, tremors, seizures, syncope, facial asymmetry, speech difficulty, light-headedness, numbness and headaches.  Hematological: Negative.   Psychiatric/Behavioral: Positive for agitation, behavioral problems, confusion and decreased concentration. Negative for dysphoric mood, hallucinations, self-injury and sleep disturbance. The patient is nervous/anxious. The patient is not hyperactive.     Immunization History  Administered Date(s) Administered  . Influenza Whole 01/16/2012, 02/20/2013  . Influenza-Unspecified 02/19/2014, 01/05/2015, 02/01/2016  . PPD Test 08/23/2010, 08/04/2015  . Pneumococcal Polysaccharide-23 06/24/1998  . Td 05/02/1974   Pertinent  Health Maintenance Due  Topic Date Due  . DEXA SCAN  03/19/1986  . PNA vac Low Risk Adult (2 of 2 - PCV13) 06/24/1999  . INFLUENZA VACCINE  Completed   Fall Risk  08/06/2015 08/06/2015 11/19/2014 11/19/2014 05/21/2014  Falls in the past year? No No Yes No Yes  Number falls in past yr: - - 1 - -  Injury with Fall? - - No - -   Functional Status Survey:    Vitals:   03/13/16 1345  BP: 136/82  Pulse: 78  Resp: (!) 22  Temp: 98.2 F (36.8 C)  Weight: 154 lb 6.4 oz (70 kg)  Height: 5\' 3"  (1.6 m)   Body mass index is 27.35 kg/m. Physical Exam  Labs reviewed:  Recent  Labs  08/03/15 11/10/15  NA 137 140  140  K 4.1 3.9  BUN 20 22*  CREATININE 1.2* 1.4*    Recent Labs  08/03/15  AST 21  ALT 17  ALKPHOS 80    Recent Labs  08/03/15  WBC 6.5  HGB 13.8  HCT 40  PLT 211   Lab Results  Component Value Date   TSH 2.72 08/04/2015   Lab Results  Component Value Date   HGBA1C 5.7 03/10/2014   Lab Results  Component Value Date   CHOL 297 (A) 04/01/2013   HDL 54 04/01/2013   LDLCALC 200 04/01/2013   TRIG 214 (A) 04/01/2013    Significant Diagnostic Results in last 30 days:  No results found.  Assessment/Plan Dementia due to medical condition with behavioral disturbance continue  Aricept, SNF for care needs Increased confusion, not sleeping at nihgt, resisting care, combative, swing and hit staff, difficulty to redirect,  x 2 days, fell x1, no apparent injury,  update CBC CMP UA C/S   Essential hypertension Controlled. Continue Furosemide 20mg  daily. 11/09/15 Na 136, K 6.3, Bun 42, creat 1.48, 11/10/15 Na 140, K 3.9, Bun 22, creat 1.4  Edema Not  apparent today. Continue Furosemide 20mg  daily.    Fall 03/12/16 x1, no apparent injury. Lack of safety awareness and increased frailty contributory. Close supervision for safety.       Family/ staff Communication: SNF MCU  Labs/tests ordered:  CBC CMP UA C/S

## 2016-03-13 NOTE — Assessment & Plan Note (Signed)
continue  Aricept, SNF for care needs Increased confusion, not sleeping at nihgt, resisting care, combative, swing and hit staff, difficulty to redirect,  x 2 days, fell x1, no apparent injury,  update CBC CMP UA C/S

## 2016-03-14 LAB — HEPATIC FUNCTION PANEL
ALK PHOS: 88 U/L (ref 25–125)
ALT: 17 U/L (ref 7–35)
AST: 22 U/L (ref 13–35)
Bilirubin, Total: 0.5 mg/dL

## 2016-03-14 LAB — BASIC METABOLIC PANEL
BUN: 19 mg/dL (ref 4–21)
CREATININE: 1.3 mg/dL — AB (ref <=1.1)
GLUCOSE: 90 mg/dL
Potassium: 4.1 mmol/L (ref 3.4–5.3)
Sodium: 139 mmol/L (ref 137–147)

## 2016-03-14 LAB — CBC AND DIFFERENTIAL
HCT: 42 % (ref 36–46)
HEMOGLOBIN: 14.2 g/dL (ref 12.0–16.0)
Platelets: 257 10*3/uL (ref 150–399)
WBC: 7.1 10*3/mL

## 2016-03-15 ENCOUNTER — Other Ambulatory Visit: Payer: Self-pay | Admitting: *Deleted

## 2016-03-20 ENCOUNTER — Non-Acute Institutional Stay (SKILLED_NURSING_FACILITY): Payer: Medicare Other | Admitting: Nurse Practitioner

## 2016-03-20 ENCOUNTER — Encounter: Payer: Self-pay | Admitting: Nurse Practitioner

## 2016-03-20 DIAGNOSIS — F02818 Dementia in other diseases classified elsewhere, unspecified severity, with other behavioral disturbance: Secondary | ICD-10-CM

## 2016-03-20 DIAGNOSIS — N390 Urinary tract infection, site not specified: Secondary | ICD-10-CM | POA: Diagnosis not present

## 2016-03-20 DIAGNOSIS — I10 Essential (primary) hypertension: Secondary | ICD-10-CM

## 2016-03-20 DIAGNOSIS — F0281 Dementia in other diseases classified elsewhere with behavioral disturbance: Secondary | ICD-10-CM

## 2016-03-20 DIAGNOSIS — R609 Edema, unspecified: Secondary | ICD-10-CM | POA: Diagnosis not present

## 2016-03-20 NOTE — Progress Notes (Signed)
Location:  [facility] Room Number: 111 Place of Service:  SNF (31) Provider:  Mast, Manxie  NP  [name], MD  Patient Care Team: [name], MD as PCP - General (Internal Medicine) [facility] [name], MD as Consulting Physician (Internal Medicine) Man [name], NP as Nurse Practitioner (Internal Medicine)  Extended Emergency Contact Information Primary Emergency Contact: [facility] Address: [address]          [place], [place] [address] [place] Home Phone: [phone] Relation: Son Secondary Emergency Contact: [name],[name] Address: [address]          [place], [place] [address] [place] Home Phone: [phone] Relation: Daughter  Code Status:  DNR Goals of care: Advanced Directive information Advanced Directives 03/20/2016  Does Patient Have a Medical Advance Directive? Yes  Type of Estate agent of [place];Living will  Does patient want to make changes to medical advance directive? No - Patient declined  Copy of Healthcare Power of Attorney in Chart? Yes  Pre-existing out of facility DNR order (yellow form or pink MOST form) -     Chief Complaint  Patient presents with  . Acute Visit    UTI    HPI:  Pt is a 82 y.o. female seen today for an acute visit for urine culture 03/20/16 P. Mirabilis, 7 day course of Cipro 500mg  bid started.     Hx of dementia, taking Aricept, resides Memory Care unit [facility], chronic multiple sites arthritis, ambulates with walker on unit, chronic edema is managed with daily Furosemide 20mg .  Past Medical History:  Diagnosis Date  . Abnormal mammogram, unspecified 10/25/2006  . Abnormality of gait 03/02/2011  . Allergic rhinitis due to pollen 07/13/2006  . Blood in stool 06/29/2011  . Candidiasis 07/01/2012  . Cholelithiasis 08/02/2010  . Disturbance of skin sensation 11/25/2009  . Diverticulitis of colon (without mention of  hemorrhage)(562.11) 10/28/2003  . Dizziness and giddiness 07/13/2006  . Edema 07/19/2006  . Lumbago 12/22/2009  . Memory loss 02/17/2010  . Osteoarthrosis, unspecified whether generalized or localized, unspecified site 07/19/2006  . Other and unspecified hyperlipidemia 03/28/2007  . Pain in joint, upper arm 10/25/2006  . Personal history of fall 12/22/2009  . Unspecified constipation 09/26/2007  . Unspecified essential hypertension 07/19/2006  . Unspecified vitamin D deficiency 08/19/2009  . Uterine prolapse without mention of vaginal wall prolapse 06/29/2011   Past Surgical History:  Procedure Laterality Date  . CATARACT EXTRACTION W/ INTRAOCULAR LENS  IMPLANT, BILATERAL Bilateral 1999    Allergies  Allergen Reactions  . Lopid [Gemfibrozil]   . Sulfa Antibiotics   . Zocor [Simvastatin]       Medication List       Accurate as of 03/20/16 12:38 PM. Always use your most recent med list.          acetaminophen 325 MG tablet Commonly known as:  TYLENOL Take 325 mg by mouth every 6 (six) hours as needed for mild pain. Take two tablets every 4 hours as needed for pain   donepezil 10 MG tablet Commonly known as:  ARICEPT One daily to help preserve memory.   furosemide 20 MG tablet Commonly known as:  LASIX Take 20 mg by mouth daily.   loratadine 10 MG tablet Commonly known as:  CLARITIN Take 10 mg by mouth daily.   TUMS 500 MG chewable tablet Generic drug:  calcium carbonate Chew 3 tablets by mouth daily. Take three  tablets once daily   Vitamin D 1000 units capsule Take 1,000 Units by mouth. Take 2 daily       Review of Systems  Constitutional: Positive for activity change, appetite change and fatigue. Negative for chills and diaphoresis.  HENT: Positive for hearing loss. Negative for congestion, ear pain, facial swelling, nosebleeds, postnasal drip, sneezing and tinnitus.   Eyes: Negative for photophobia, pain, discharge, itching and visual disturbance.  Respiratory: Negative  for cough, choking, chest tightness, shortness of breath and stridor.   Cardiovascular: Positive for leg swelling. Negative for chest pain and palpitations.  Gastrointestinal: Positive for constipation. Negative for abdominal distention, abdominal pain, diarrhea and nausea.  Endocrine: Negative.   Genitourinary:       Incontinent  Musculoskeletal: Positive for arthralgias and gait problem. Negative for back pain, joint swelling, myalgias, neck pain and neck stiffness.       Hx fx right 5th MC  Skin: Negative for color change (Erythema of the distal legs).  Allergic/Immunologic: Negative.   Neurological: Negative for dizziness, tremors, seizures, syncope, facial asymmetry, speech difficulty, light-headedness, numbness and headaches.  Hematological: Negative.   Psychiatric/Behavioral: Positive for agitation, behavioral problems, confusion and decreased concentration. Negative for dysphoric mood, hallucinations, self-injury and sleep disturbance. The patient is nervous/anxious. The patient is not hyperactive.     Immunization History  Administered Date(s) Administered  . Influenza Whole 01/16/2012, 02/20/2013  . Influenza-Unspecified 02/19/2014, 01/05/2015, 02/01/2016  . PPD Test 08/23/2010, 08/04/2015  . Pneumococcal Polysaccharide-23 06/24/1998  . Td 05/02/1974   Pertinent  Health Maintenance Due  Topic Date Due  . DEXA SCAN  03/19/1986  . PNA vac Low Risk Adult (2 of 2 - PCV13) 06/24/1999  . INFLUENZA VACCINE  Completed   Fall Risk  08/06/2015 08/06/2015 11/19/2014 11/19/2014 05/21/2014  Falls in the past year? No No Yes No Yes  Number falls in past yr: - - 1 - -  Injury with Fall? - - No - -   Functional Status Survey:    Vitals:   03/20/16 1146  BP: 102/60  Pulse: 68  Resp: 16  Temp: 98.5 F (36.9 C)  Weight: 153 lb 6.4 oz (69.6 kg)  Height: 5\' 3"  (1.6 m)   Body mass index is 27.17 kg/m. Physical Exam  Constitutional:  overweight  HENT:  Mild loss of hearing  Eyes:    Corrective lenses.  Neck: No JVD present. No thyromegaly present.  Cardiovascular: Normal rate, regular rhythm, normal heart sounds and intact distal pulses.  Exam reveals no gallop and no friction rub.   No murmur heard. Pulmonary/Chest: Effort normal and breath sounds normal. No respiratory distress. She has no wheezes. She has no rales. She exhibits no tenderness.  Abdominal: Bowel sounds are normal. She exhibits no distension and no mass. There is no tenderness.  Musculoskeletal: Normal range of motion. She exhibits edema. She exhibits no tenderness.  Unstable gait; using 4 wheel walker.   Lymphadenopathy:    She has no cervical adenopathy.  Neurological: She is alert. No cranial nerve deficit. Coordination normal.  10/23/13 MMSE 17/30. Failed clock drawing.  Skin: No rash noted. No erythema (diatal legs bilaterally). No pallor.  Psychiatric: She has a normal mood and affect.    Labs reviewed:  Recent Labs  08/03/15 11/10/15 03/14/16  NA 137 140  140 139  K 4.1 3.9 4.1  BUN 20 22* 19  CREATININE 1.2* 1.4* 1.3*    Recent Labs  08/03/15 03/14/16  AST 21 22  ALT 17 17  ALKPHOS 80 88    Recent Labs  08/03/15 03/14/16  WBC 6.5 7.1  HGB 13.8 14.2  HCT 40 42  PLT 211 257   Lab Results  Component Value Date   TSH 2.72 08/04/2015   Lab Results  Component Value Date   HGBA1C 5.7 03/10/2014   Lab Results  Component Value Date   CHOL 297 (A) 04/01/2013   HDL 54 04/01/2013   LDLCALC 200 04/01/2013   TRIG 214 (A) 04/01/2013    Significant Diagnostic Results in last 30 days:  No results found.  Assessment/Plan UTI (urinary tract infection) urine culture 03/20/16 P. Mirabilis, 7 day course of Cipro 500mg  bid started.     Essential hypertension Controlled. Continue Furosemide 20mg  daily. 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 Mirabilis, 7 day course of Cipro 500mg  bid started.    Edema Not apparent today. Continue Furosemide 20mg  daily.        Family/ staff Communication: SNF MCU  Labs/tests ordered:  UA C/S done 03/15/16

## 2016-03-20 NOTE — Assessment & Plan Note (Addendum)
Controlled. Continue Furosemide 20mg  daily. 03/14/16 wbc 7.1, Hgb 14.2, plt 257, Na 139, K 4.1, Bun 19, creat 1.31, TP 6.2, albumin 3.6

## 2016-03-20 NOTE — Assessment & Plan Note (Signed)
urine culture 03/20/16 P. Mirabilis, 7 day course of Cipro 500mg  bid started.

## 2016-03-20 NOTE — Assessment & Plan Note (Signed)
Not apparent today. Continue Furosemide 20mg daily.   

## 2016-03-20 NOTE — Assessment & Plan Note (Signed)
continue  Aricept, SNF for care needs Increased confusion, not sleeping at nihgt, resisting care, combative, swing and hit staff, difficulty to redirect, fell x1 urine culture 03/20/16 P. Mirabilis, 7 day course of Cipro 500mg  bid started.

## 2016-04-19 ENCOUNTER — Encounter: Payer: Self-pay | Admitting: Nurse Practitioner

## 2016-04-19 DIAGNOSIS — R059 Cough, unspecified: Secondary | ICD-10-CM | POA: Insufficient documentation

## 2016-04-19 DIAGNOSIS — R05 Cough: Secondary | ICD-10-CM | POA: Insufficient documentation

## 2016-04-26 ENCOUNTER — Encounter: Payer: Self-pay | Admitting: Nurse Practitioner

## 2016-04-26 ENCOUNTER — Non-Acute Institutional Stay (SKILLED_NURSING_FACILITY): Payer: Medicare Other | Admitting: Nurse Practitioner

## 2016-04-26 DIAGNOSIS — I10 Essential (primary) hypertension: Secondary | ICD-10-CM | POA: Diagnosis not present

## 2016-04-26 DIAGNOSIS — M150 Primary generalized (osteo)arthritis: Secondary | ICD-10-CM

## 2016-04-26 DIAGNOSIS — R609 Edema, unspecified: Secondary | ICD-10-CM | POA: Diagnosis not present

## 2016-04-26 DIAGNOSIS — F0281 Dementia in other diseases classified elsewhere with behavioral disturbance: Secondary | ICD-10-CM

## 2016-04-26 DIAGNOSIS — F02818 Dementia in other diseases classified elsewhere, unspecified severity, with other behavioral disturbance: Secondary | ICD-10-CM

## 2016-04-26 DIAGNOSIS — M159 Polyosteoarthritis, unspecified: Secondary | ICD-10-CM

## 2016-04-26 NOTE — Progress Notes (Signed)
Location:  [facility] Room Number: 111 Place of Service:  SNF (31) Provider:  Jacek Colson, Manxie NP  Murray HodgkinsArthur Green, MD  Patient Care Team: Kimber RelicArthur G Green, MD as PCP - General (Internal Medicine) [facility] Kimber RelicArthur G Green, MD as Consulting Physician (Internal Medicine) Maleigh Bagot Johnney OuX Yareliz Thorstenson, NP as Nurse Practitioner (Internal Medicine)  Extended Emergency Contact Information Primary Emergency Contact: [facility]Katayama,Ron Address: [address]          [place], [place] [address] [place] Home Phone: [phone] Relation: Son Secondary Emergency Contact: [name],[name] Address: [address]          [place], [place] [address] [place] Home Phone: [phone] Relation: Daughter  Code Status:  DNR Goals of care: Advanced Directive information Advanced Directives 04/26/2016  Does Patient Have a Medical Advance Directive? Yes  Type of Advance Directive Living will;Healthcare Power of Attorney  Does patient want to make changes to medical advance directive? No - Patient declined  Copy of Healthcare Power of Attorney in Chart? Yes  Pre-existing out of facility DNR order (yellow form or pink MOST form) -     Chief Complaint  Patient presents with  . Medical Management of Chronic Issues    HPI:  Pt is a 83 y.o. female seen today for medical management of chronic diseases.    Hx of dementia, taking Aricept, [facility], chronic multiple sites arthritis, ambulates with walker on unit, chronic edema is managed with daily Furosemide 20mg .  Past Medical History:  Diagnosis Date  . Abnormal mammogram, unspecified 10/25/2006  . Abnormality of gait 03/02/2011  . Allergic rhinitis due to pollen 07/13/2006  . Blood in stool 06/29/2011  . Candidiasis 07/01/2012  . Cholelithiasis 08/02/2010  . Disturbance of skin sensation 11/25/2009  . Diverticulitis of colon (without mention of hemorrhage)(562.11) 10/28/2003  . Dizziness and  giddiness 07/13/2006  . Edema 07/19/2006  . Lumbago 12/22/2009  . Memory loss 02/17/2010  . Osteoarthrosis, unspecified whether generalized or localized, unspecified site 07/19/2006  . Other and unspecified hyperlipidemia 03/28/2007  . Pain in joint, upper arm 10/25/2006  . Personal history of fall 12/22/2009  . Unspecified constipation 09/26/2007  . Unspecified essential hypertension 07/19/2006  . Unspecified vitamin D deficiency 08/19/2009  . Uterine prolapse without mention of vaginal wall prolapse 06/29/2011   Past Surgical History:  Procedure Laterality Date  . CATARACT EXTRACTION W/ INTRAOCULAR LENS  IMPLANT, BILATERAL Bilateral 1999    Allergies  Allergen Reactions  . Lopid [Gemfibrozil]   . Sulfa Antibiotics   . Zocor [Simvastatin]     Allergies as of 04/26/2016      Reactions   Lopid [gemfibrozil]    Sulfa Antibiotics    Zocor [simvastatin]       Medication List       Accurate as of 04/26/16  1:20 PM. Always use your most recent med list.          acetaminophen 325 MG tablet Commonly known as:  TYLENOL Take 325 mg by mouth every 6 (six) hours as needed for mild pain. Take two tablets every 4 hours as needed for pain   donepezil 10 MG tablet Commonly known as:  ARICEPT One daily to help preserve memory.   furosemide 20 MG tablet Commonly known as:  LASIX Take 20 mg by mouth daily.   loratadine 10 MG tablet Commonly known as:  CLARITIN Take 10 mg by mouth daily.   oseltamivir 75 MG capsule  Commonly known as:  TAMIFLU Take 75 mg by mouth.   TUMS 500 MG chewable tablet Generic drug:  calcium carbonate Chew 3 tablets by mouth daily. Take three tablets once daily   Vitamin D 1000 units capsule Take 1,000 Units by mouth. Take 2 daily       Review of Systems  Constitutional: Negative for activity change, appetite change, chills, diaphoresis and fatigue.  HENT: Positive for hearing loss. Negative for congestion, ear pain, facial swelling, nosebleeds, postnasal  drip, sneezing and tinnitus.   Eyes: Negative for photophobia, pain, discharge, itching and visual disturbance.  Respiratory: Negative for cough, choking, chest tightness, shortness of breath and stridor.   Cardiovascular: Positive for leg swelling. Negative for chest pain and palpitations.  Gastrointestinal: Positive for constipation. Negative for abdominal distention, abdominal pain, diarrhea and nausea.  Endocrine: Negative.   Genitourinary:       Incontinent  Musculoskeletal: Positive for arthralgias and gait problem. Negative for back pain, joint swelling, myalgias, neck pain and neck stiffness.       Hx fx right 5th MC  Skin: Negative for color change (Erythema of the distal legs).  Allergic/Immunologic: Negative.   Neurological: Negative for dizziness, tremors, seizures, syncope, facial asymmetry, speech difficulty, light-headedness, numbness and headaches.  Hematological: Negative.   Psychiatric/Behavioral: Positive for agitation, behavioral problems, confusion and decreased concentration. Negative for dysphoric mood, hallucinations, self-injury and sleep disturbance. The patient is nervous/anxious. The patient is not hyperactive.     Immunization History  Administered Date(s) Administered  . Influenza Whole 01/16/2012, 02/20/2013  . Influenza-Unspecified 02/19/2014, 01/05/2015, 02/01/2016  . PPD Test 08/23/2010, 08/04/2015  . Pneumococcal Polysaccharide-23 06/24/1998  . Td 05/02/1974   Pertinent  Health Maintenance Due  Topic Date Due  . DEXA SCAN  03/19/1986  . PNA vac Low Risk Adult (2 of 2 - PCV13) 06/24/1999  . INFLUENZA VACCINE  Completed   Fall Risk  08/06/2015 08/06/2015 11/19/2014 11/19/2014 05/21/2014  Falls in the past year? No No Yes No Yes  Number falls in past yr: - - 1 - -  Injury with Fall? - - No - -   Functional Status Survey:    Vitals:   04/26/16 1222  BP: 120/80  Pulse: 76  Resp: 20  Temp: 98.3 F (36.8 C)  Weight: 153 lb 6.4 oz (69.6 kg)  Height:  5\' 3"  (1.6 m)   Body mass index is 27.17 kg/m. Physical Exam  Constitutional:  overweight  HENT:  Mild loss of hearing  Eyes:  Corrective lenses.  Neck: No JVD present. No thyromegaly present.  Cardiovascular: Normal rate, regular rhythm, normal heart sounds and intact distal pulses.  Exam reveals no gallop and no friction rub.   No murmur heard. Pulmonary/Chest: Effort normal and breath sounds normal. No respiratory distress. She has no wheezes. She has no rales. She exhibits no tenderness.  Abdominal: Bowel sounds are normal. She exhibits no distension and no mass. There is no tenderness.  Musculoskeletal: Normal range of motion. She exhibits edema. She exhibits no tenderness.  Unstable gait; using 4 wheel walker.   Lymphadenopathy:    She has no cervical adenopathy.  Neurological: She is alert. No cranial nerve deficit. Coordination normal.  10/23/13 MMSE 17/30. Failed clock drawing.  Skin: No rash noted. No erythema (diatal legs bilaterally). No pallor.  Psychiatric: She has a normal mood and affect.    Labs reviewed:  Recent Labs  08/03/15 11/10/15 03/14/16  NA 137 140  140 139  K 4.1 3.9 4.1  BUN 20 22* 19  CREATININE 1.2* 1.4* 1.3*    Recent Labs  08/03/15 03/14/16  AST 21 22  ALT 17 17  ALKPHOS 80 88    Recent Labs  08/03/15 03/14/16  WBC 6.5 7.1  HGB 13.8 14.2  HCT 40 42  PLT 211 257   Lab Results  Component Value Date   TSH 2.72 08/04/2015   Lab Results  Component Value Date   HGBA1C 5.7 03/10/2014   Lab Results  Component Value Date   CHOL 297 (A) 04/01/2013   HDL 54 04/01/2013   LDLCALC 200 04/01/2013   TRIG 214 (A) 04/01/2013    Significant Diagnostic Results in last 30 days:  No results found.  Assessment/Plan Essential hypertension Controlled. Continue Furosemide 20mg  daily. 03/14/16 wbc 7.1, Hgb 14.2, plt 257, Na 139, K 4.1, Bun 19, creat 1.31, TP 6.2, albumin 3.6    Dementia due to medical condition with behavioral  disturbance continue  Aricept, SNF for care needs   Osteoarthritis (arthritis due to wear and tear of joints) Multiple sites, prn Tylenol, ambulates with walker, frequent resting    Edema Not apparent today. Continue Furosemide 20mg  daily.       Family/ staff Communication: SNF MCU  Labs/tests ordered:  none

## 2016-04-26 NOTE — Assessment & Plan Note (Signed)
Controlled. Continue Furosemide 20mg daily. 03/14/16 wbc 7.1, Hgb 14.2, plt 257, Na 139, K 4.1, Bun 19, creat 1.31, TP 6.2, albumin 3.6  

## 2016-04-26 NOTE — Assessment & Plan Note (Signed)
continue  Aricept, SNF for care needs

## 2016-04-26 NOTE — Assessment & Plan Note (Signed)
Multiple sites, prn Tylenol, ambulates with walker, frequent resting   

## 2016-04-26 NOTE — Assessment & Plan Note (Signed)
Not apparent today. Continue Furosemide 20mg daily.   

## 2016-05-18 ENCOUNTER — Encounter: Payer: Self-pay | Admitting: Internal Medicine

## 2016-05-18 ENCOUNTER — Non-Acute Institutional Stay (SKILLED_NURSING_FACILITY): Payer: Medicare Other | Admitting: Internal Medicine

## 2016-05-18 DIAGNOSIS — N181 Chronic kidney disease, stage 1: Secondary | ICD-10-CM

## 2016-05-18 DIAGNOSIS — I10 Essential (primary) hypertension: Secondary | ICD-10-CM | POA: Diagnosis not present

## 2016-05-18 DIAGNOSIS — F02818 Dementia in other diseases classified elsewhere, unspecified severity, with other behavioral disturbance: Secondary | ICD-10-CM

## 2016-05-18 DIAGNOSIS — F0281 Dementia in other diseases classified elsewhere with behavioral disturbance: Secondary | ICD-10-CM

## 2016-05-18 DIAGNOSIS — R609 Edema, unspecified: Secondary | ICD-10-CM | POA: Diagnosis not present

## 2016-05-18 NOTE — Progress Notes (Signed)
Progress Note    Location:  [facility] Room Number: [phone] Place of Service:  SNF [phone]) Provider:  Murray HodgkinsArthur Green, MD  Patient Care Team: Kimber RelicArthur G Green, MD as PCP - General (Internal Medicine) [facility] Kimber RelicArthur G Green, MD as Consulting Physician (Internal Medicine) Man Johnney OuX Mast, NP as Nurse Practitioner (Internal Medicine)  Extended Emergency Contact Information [facility]: [facility] Address: [address]          [place], [place] [address] [place] Home Phone: [phone] Relation: Son Secondary Emergency Contact: [name],[name] Address: [address]          [place], [place] [address] [place] Home Phone: [phone] Relation: Daughter  Code Status:  DNR Goals of care: Advanced Directive information Advanced Directives 05/18/2016  Does Patient Have a Medical Advance Directive? Yes  Type of Estate agentAdvance Directive Healthcare Power of [place]verse[place]orney;Living will;Out of facility DNR (pink MOST or yellow form)  Does patient want to make changes to medical advance directive? -  Copy of Healthcare Power of Attorney in Chart? Yes  Pre-existing out of facility DNR order (yellow form or pink MOST form) Yellow form placed in chart (order not valid for inpatient use)     Chief Complaint  Patient presents with  . Medical Management of Chronic Issues    routine visit    HPI:  Pt is a 83 y.o. female seen today for medical management of chronic diseases.    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 Gradual deterioration.  Essential hypertension - controlled  Edema, unspecified type - chronic and stable in both ankles and lower legs.  Stage 1 chronic kidney disease - stable     Past Medical History:  Diagnosis Date  . Abnormal mammogram, unspecified 10/25/2006  . Abnormality of gait 03/02/2011  . Allergic rhinitis due to pollen 07/13/2006  . Blood in stool 06/29/2011  .  Candidiasis 07/01/2012  . Cholelithiasis 08/02/2010  . Disturbance of skin sensation 11/25/2009  . Diverticulitis of colon (without mention of hemorrhage)(562.11) 10/28/2003  . Dizziness and giddiness 07/13/2006  . Edema 07/19/2006  . Lumbago 12/22/2009  . Memory loss 02/17/2010  . Osteoarthrosis, unspecified whether generalized or localized, unspecified site 07/19/2006  . Other and unspecified hyperlipidemia 03/28/2007  . Pain in joint, upper arm 10/25/2006  . Personal history of fall 12/22/2009  . Unspecified constipation 09/26/2007  . Unspecified essential hypertension 07/19/2006  . Unspecified vitamin D deficiency 08/19/2009  . Uterine prolapse without mention of vaginal wall prolapse 06/29/2011   Past Surgical History:  Procedure Laterality Date  . CATARACT EXTRACTION W/ INTRAOCULAR LENS  IMPLANT, BILATERAL Bilateral 1999    Allergies  Allergen Reactions  . Lopid [Gemfibrozil]   . Sulfa Antibiotics   . Zocor [Simvastatin]     Allergies as of 05/18/2016      Reactions   Lopid [gemfibrozil]    Sulfa Antibiotics    Zocor [simvastatin]       Medication List       Accurate as of 05/18/16 10:40 AM. Always use your most recent med list.          acetaminophen 325 MG tablet Commonly known as:  TYLENOL Take 325 mg by mouth every 6 (six) hours as needed for mild pain. Take two tablets every 4 hours as needed for pain   donepezil 10 MG tablet Commonly known as:  ARICEPT One daily to help preserve memory.   furosemide 20  MG tablet Commonly known as:  LASIX Take 20 mg by mouth daily.   loratadine 10 MG tablet Commonly known as:  CLARITIN Take 10 mg by mouth daily.   TUMS 500 MG chewable tablet Generic drug:  calcium carbonate Chew 3 tablets by mouth daily. Take three tablets once daily   Vitamin D 1000 units capsule Take 1,000 Units by mouth. Take 2 daily       Review of Systems  Constitutional: Negative for activity change, appetite change, chills, diaphoresis and fatigue.    HENT: Positive for hearing loss. Negative for congestion, ear pain, facial swelling, nosebleeds, postnasal drip, sneezing and tinnitus.   Eyes: Negative for photophobia, pain, discharge, itching and visual disturbance.  Respiratory: Negative for cough, choking, chest tightness, shortness of breath and stridor.   Cardiovascular: Positive for leg swelling. Negative for chest pain and palpitations.  Gastrointestinal: Positive for constipation. Negative for abdominal distention, abdominal pain, diarrhea and nausea.  Endocrine: Negative.   Genitourinary:       Incontinent  Musculoskeletal: Positive for arthralgias and gait problem. Negative for back pain, joint swelling, myalgias, neck pain and neck stiffness.       Hx fx right 5th MC  Skin: Negative for color change (Erythema of the distal legs).  Allergic/Immunologic: Negative.   Neurological: Negative for dizziness, tremors, seizures, syncope, facial asymmetry, speech difficulty, light-headedness, numbness and headaches.  Hematological: Negative.   Psychiatric/Behavioral: Positive for agitation, behavioral problems, confusion and decreased concentration. Negative for dysphoric mood, hallucinations, self-injury and sleep disturbance. The patient is nervous/anxious. The patient is not hyperactive.     Immunization History  Administered Date(s) Administered  . Influenza Whole 01/16/2012, 02/20/2013  . Influenza-Unspecified 02/19/2014, 01/05/2015, 02/01/2016  . PPD Test 08/23/2010, 08/04/2015  . Pneumococcal Polysaccharide-23 06/24/1998  . Td 05/02/1974   Pertinent  Health Maintenance Due  Topic Date Due  . DEXA SCAN  03/19/1986  . PNA vac Low Risk Adult (2 of 2 - PCV13) 06/24/1999  . INFLUENZA VACCINE  Completed   Fall Risk  08/06/2015 08/06/2015 11/19/2014 11/19/2014 05/21/2014  Falls in the past year? No No Yes No Yes  Number falls in past yr: - - 1 - -  Injury with Fall? - - No - -   Functional Status Survey:    Vitals:   05/18/16  1033  BP: 116/76  Pulse: 62  Resp: 18  Temp: 97.4 F (36.3 C)  SpO2: 94%  Height: 5\' 3"  (1.6 m)   There is no height or weight on file to calculate BMI. Physical Exam  Constitutional:  overweight  HENT:  Mild loss of hearing  Eyes:  Corrective lenses.  Neck: No JVD present. No thyromegaly present.  Cardiovascular: Normal rate, regular rhythm, normal heart sounds and intact distal pulses.  Exam reveals no gallop and no friction rub.   No murmur heard. Pulmonary/Chest: Effort normal and breath sounds normal. No respiratory distress. She has no wheezes. She has no rales. She exhibits no tenderness.  Abdominal: Bowel sounds are normal. She exhibits no distension and no mass. There is no tenderness.  Musculoskeletal: Normal range of motion. She exhibits edema. She exhibits no tenderness.  Unstable gait; using 4 wheel walker.   Lymphadenopathy:    She has no cervical adenopathy.  Neurological: She is alert. No cranial nerve deficit. Coordination normal.  10/23/13 MMSE 17/30. Failed clock drawing.  Skin: No rash noted. No erythema (diatal legs bilaterally). No pallor.  Psychiatric: She has a normal mood and affect.  Labs reviewed:  Recent Labs  08/03/15 11/10/15 03/14/16  NA 137 140  140 139  K 4.1 3.9 4.1  BUN 20 22* 19  CREATININE 1.2* 1.4* 1.3*    Recent Labs  08/03/15 03/14/16  AST 21 22  ALT 17 17  ALKPHOS 80 88    Recent Labs  08/03/15 03/14/16  WBC 6.5 7.1  HGB 13.8 14.2  HCT 40 42  PLT 211 257   Lab Results  Component Value Date   TSH 2.72 08/04/2015   Lab Results  Component Value Date   HGBA1C 5.7 03/10/2014   Lab Results  Component Value Date   CHOL 297 (A) 04/01/2013   HDL 54 04/01/2013   LDLCALC 200 04/01/2013   TRIG 214 (A) 04/01/2013    Assessment/Plan  1. Dementia due to medical condition with behavioral disturbance Unchanged. Continues to benfit from programs in Memory Unit  2. Essential hypertension The current medical regimen  is effective;  continue present plan and medications.  3. Edema, unspecified type stable  4. Stage 1 chronic kidney disease stable

## 2016-05-19 ENCOUNTER — Encounter: Payer: Self-pay | Admitting: Internal Medicine

## 2016-06-15 ENCOUNTER — Non-Acute Institutional Stay (SKILLED_NURSING_FACILITY): Payer: Medicare Other | Admitting: Nurse Practitioner

## 2016-06-15 ENCOUNTER — Encounter: Payer: Self-pay | Admitting: Nurse Practitioner

## 2016-06-15 DIAGNOSIS — F02818 Dementia in other diseases classified elsewhere, unspecified severity, with other behavioral disturbance: Secondary | ICD-10-CM

## 2016-06-15 DIAGNOSIS — M159 Polyosteoarthritis, unspecified: Secondary | ICD-10-CM

## 2016-06-15 DIAGNOSIS — R059 Cough, unspecified: Secondary | ICD-10-CM

## 2016-06-15 DIAGNOSIS — I10 Essential (primary) hypertension: Secondary | ICD-10-CM

## 2016-06-15 DIAGNOSIS — R05 Cough: Secondary | ICD-10-CM

## 2016-06-15 DIAGNOSIS — F0281 Dementia in other diseases classified elsewhere with behavioral disturbance: Secondary | ICD-10-CM | POA: Diagnosis not present

## 2016-06-15 DIAGNOSIS — M150 Primary generalized (osteo)arthritis: Secondary | ICD-10-CM

## 2016-06-15 DIAGNOSIS — R609 Edema, unspecified: Secondary | ICD-10-CM

## 2016-06-15 NOTE — Assessment & Plan Note (Signed)
Mild hacking in nature, no O2 desaturation, denied chest pain, doesn't interfere night sleep/rest, continue Claritin daily.

## 2016-06-15 NOTE — Assessment & Plan Note (Signed)
Controlled. Continue Furosemide 20mg  daily.

## 2016-06-15 NOTE — Assessment & Plan Note (Signed)
Multiple sites, prn Tylenol, ambulates with walker, frequent resting   

## 2016-06-15 NOTE — Assessment & Plan Note (Signed)
continue  Aricept, SNF for care needs

## 2016-06-15 NOTE — Assessment & Plan Note (Signed)
Not apparent today. Continue Furosemide 20mg daily.   

## 2016-06-15 NOTE — Progress Notes (Signed)
Location:  [facility] Room Number: [phone] Place of Service:  SNF (31) Provider:  Dorene Bruni, Manxie  NP   [name], MD  Patient Care Team: [name], MD as PCP - General (Internal Medicine) [facility] [name], MD as Consulting Physician (Internal Medicine) Clarine Elrod [name], NP as Nurse Practitioner (Internal Medicine)  Extended Emergency Contact Information Primary Emergency Contact: [facility] Address: [address]          [place], [place] [address] [place] Home Phone: [phone] Relation: Son Secondary Emergency Contact: [name],[name] Address: [address]          [place], [place] [address] [place] Home Phone: [phone] Relation: Daughter  Code Status:  DNR Goals of care: Advanced Directive information Advanced Directives 06/15/2016  Does Patient Have a Medical Advance Directive? Yes  Type of Estate agent of [place];Living will;Out of facility DNR (pink MOST or yellow form)  Does patient want to make changes to medical advance directive? No - Patient declined  Copy of Healthcare Power of Attorney in Chart? Yes  Pre-existing out of facility DNR order (yellow form or pink MOST form) Yellow form placed in chart (order not valid for inpatient use)     Chief Complaint  Patient presents with  . Medical Management of Chronic Issues    HPI:  Pt is a 83 y.o. female seen today for medical management of chronic diseases.     Hx of dementia, taking Aricept, resides Memory Care unit [facility], chronic multiple sites arthritis, ambulates with walker on unit, chronic edema is managed with daily Furosemide 20mg .  Past Medical History:  Diagnosis Date  . Abnormal mammogram, unspecified 10/25/2006  . Abnormality of gait 03/02/2011  . Allergic rhinitis due to pollen 07/13/2006  . Blood in stool 06/29/2011  . Candidiasis 07/01/2012  . Cholelithiasis 08/02/2010  . Disturbance of skin sensation  11/25/2009  . Diverticulitis of colon (without mention of hemorrhage)(562.11) 10/28/2003  . Dizziness and giddiness 07/13/2006  . Edema 07/19/2006  . Lumbago 12/22/2009  . Memory loss 02/17/2010  . Osteoarthrosis, unspecified whether generalized or localized, unspecified site 07/19/2006  . Other and unspecified hyperlipidemia 03/28/2007  . Pain in joint, upper arm 10/25/2006  . Personal history of fall 12/22/2009  . Unspecified constipation 09/26/2007  . Unspecified essential hypertension 07/19/2006  . Unspecified vitamin D deficiency 08/19/2009  . Uterine prolapse without mention of vaginal wall prolapse 06/29/2011   Past Surgical History:  Procedure Laterality Date  . CATARACT EXTRACTION W/ INTRAOCULAR LENS  IMPLANT, BILATERAL Bilateral 1999    Allergies  Allergen Reactions  . Lopid [Gemfibrozil]   . Sulfa Antibiotics   . Zocor [Simvastatin]     Allergies as of 06/15/2016      Reactions   Lopid [gemfibrozil]    Sulfa Antibiotics    Zocor [simvastatin]       Medication List       Accurate as of 06/15/16  2:06 PM. Always use your most recent med list.          acetaminophen 325 MG tablet Commonly known as:  TYLENOL Take 325 mg by mouth every 6 (six) hours as needed for mild pain. Take two tablets every 4 hours as needed for pain   donepezil 10 MG tablet Commonly known as:  ARICEPT One daily to help preserve memory.   furosemide 20 MG tablet Commonly known as:  LASIX Take 20 mg by mouth daily.  loratadine 10 MG tablet Commonly known as:  CLARITIN Take 10 mg by mouth daily.   TUMS 500 MG chewable tablet Generic drug:  calcium carbonate Chew 3 tablets by mouth daily. Take three tablets once daily   Vitamin D 1000 units capsule Take 1,000 Units by mouth. Take 2 daily       Review of Systems  Constitutional: Negative for activity change, appetite change, chills, diaphoresis and fatigue.  HENT: Positive for hearing loss. Negative for congestion, ear pain, facial swelling,  nosebleeds, postnasal drip, sneezing and tinnitus.   Eyes: Negative for photophobia, pain, discharge, itching and visual disturbance.  Respiratory: Negative for cough, choking, chest tightness, shortness of breath and stridor.   Cardiovascular: Positive for leg swelling. Negative for chest pain and palpitations.  Gastrointestinal: Positive for constipation. Negative for abdominal distention, abdominal pain, diarrhea and nausea.  Endocrine: Negative.   Genitourinary:       Incontinent  Musculoskeletal: Positive for arthralgias and gait problem. Negative for back pain, joint swelling, myalgias, neck pain and neck stiffness.       Hx fx right 5th MC  Skin: Negative for color change (Erythema of the distal legs).  Allergic/Immunologic: Negative.   Neurological: Negative for dizziness, tremors, seizures, syncope, facial asymmetry, speech difficulty, light-headedness, numbness and headaches.  Hematological: Negative.   Psychiatric/Behavioral: Positive for agitation, behavioral problems, confusion and decreased concentration. Negative for dysphoric mood, hallucinations, self-injury and sleep disturbance. The patient is nervous/anxious. The patient is not hyperactive.     Immunization History  Administered Date(s) Administered  . Influenza Whole 01/16/2012, 02/20/2013  . Influenza-Unspecified 02/19/2014, 01/05/2015, 02/01/2016  . PPD Test 08/23/2010, 08/04/2015  . Pneumococcal Polysaccharide-23 06/24/1998  . Td 05/02/1974   Pertinent  Health Maintenance Due  Topic Date Due  . DEXA SCAN  03/19/1986  . PNA vac Low Risk Adult (2 of 2 - PCV13) 06/24/1999  . INFLUENZA VACCINE  Completed   Fall Risk  08/06/2015 08/06/2015 11/19/2014 11/19/2014 05/21/2014  Falls in the past year? No No Yes No Yes  Number falls in past yr: - - 1 - -  Injury with Fall? - - No - -   Functional Status Survey:    Vitals:   06/15/16 1359  BP: (!) 115/58  Pulse: 72  Resp: 15  Temp: 97.5 F (36.4 C)  Weight: 142 lb  3.2 oz (64.5 kg)  Height: 5\' 3"  (1.6 m)   Body mass index is 25.19 kg/m. Physical Exam  Constitutional:  overweight  HENT:  Mild loss of hearing  Eyes:  Corrective lenses.  Neck: No JVD present. No thyromegaly present.  Cardiovascular: Normal rate, regular rhythm, normal heart sounds and intact distal pulses.  Exam reveals no gallop and no friction rub.   No murmur heard. Pulmonary/Chest: Effort normal and breath sounds normal. No respiratory distress. She has no wheezes. She has no rales. She exhibits no tenderness.  Abdominal: Bowel sounds are normal. She exhibits no distension and no mass. There is no tenderness.  Musculoskeletal: Normal range of motion. She exhibits edema. She exhibits no tenderness.  Unstable gait; using 4 wheel walker.   Lymphadenopathy:    She has no cervical adenopathy.  Neurological: She is alert. No cranial nerve deficit. Coordination normal.  10/23/13 MMSE 17/30. Failed clock drawing.  Skin: No rash noted. No erythema (diatal legs bilaterally). No pallor.  Psychiatric: She has a normal mood and affect.    Labs reviewed:  Recent Labs  08/03/15 11/10/15 03/14/16  NA 137 140  140  139  K 4.1 3.9 4.1  BUN 20 22* 19  CREATININE 1.2* 1.4* 1.3*    Recent Labs  08/03/15 03/14/16  AST 21 22  ALT 17 17  ALKPHOS 80 88    Recent Labs  08/03/15 03/14/16  WBC 6.5 7.1  HGB 13.8 14.2  HCT 40 42  PLT 211 257   Lab Results  Component Value Date   TSH 2.72 08/04/2015   Lab Results  Component Value Date   HGBA1C 5.7 03/10/2014   Lab Results  Component Value Date   CHOL 297 (A) 04/01/2013   HDL 54 04/01/2013   LDLCALC 200 04/01/2013   TRIG 214 (A) 04/01/2013    Significant Diagnostic Results in last 30 days:  No results found.  Assessment/Plan Essential hypertension Controlled. Continue Furosemide 20mg  daily.  Dementia due to medical condition with behavioral disturbance continue  Aricept, SNF for care needs  Osteoarthritis (arthritis  due to wear and tear of joints) Multiple sites, prn Tylenol, ambulates with walker, frequent resting     Cough Mild hacking in nature, no O2 desaturation, denied chest pain, doesn't interfere night sleep/rest, continue Claritin daily.   Edema Not apparent today. Continue Furosemide 20mg  daily.       Family/ staff Communication: SNF MCU  Labs/tests ordered:  none

## 2016-06-21 ENCOUNTER — Encounter: Payer: Self-pay | Admitting: Nurse Practitioner

## 2016-06-21 ENCOUNTER — Non-Acute Institutional Stay (SKILLED_NURSING_FACILITY): Payer: Medicare Other | Admitting: Nurse Practitioner

## 2016-06-21 DIAGNOSIS — I10 Essential (primary) hypertension: Secondary | ICD-10-CM

## 2016-06-21 DIAGNOSIS — F0281 Dementia in other diseases classified elsewhere with behavioral disturbance: Secondary | ICD-10-CM

## 2016-06-21 DIAGNOSIS — M150 Primary generalized (osteo)arthritis: Secondary | ICD-10-CM

## 2016-06-21 DIAGNOSIS — L8995 Pressure ulcer of unspecified site, unstageable: Secondary | ICD-10-CM | POA: Insufficient documentation

## 2016-06-21 DIAGNOSIS — M159 Polyosteoarthritis, unspecified: Secondary | ICD-10-CM

## 2016-06-21 DIAGNOSIS — L89620 Pressure ulcer of left heel, unstageable: Secondary | ICD-10-CM

## 2016-06-21 DIAGNOSIS — R609 Edema, unspecified: Secondary | ICD-10-CM | POA: Diagnosis not present

## 2016-06-21 DIAGNOSIS — F02818 Dementia in other diseases classified elsewhere, unspecified severity, with other behavioral disturbance: Secondary | ICD-10-CM

## 2016-06-21 NOTE — Assessment & Plan Note (Signed)
Skin prep to toughen the area, pressure reduction, observe.

## 2016-06-21 NOTE — Assessment & Plan Note (Signed)
continue  Aricept, SNF for care needs 

## 2016-06-21 NOTE — Assessment & Plan Note (Signed)
Controlled. Continue Furosemide 20mg daily. 

## 2016-06-21 NOTE — Progress Notes (Signed)
Location:  [facility] Room Number: [phone] Place of Service:  SNF (31) Provider:  Kortney Potvin, Manxie  NP   Murray HodgkinsArthur Green, MD  Patient Care Team: Kimber RelicArthur G Green, MD as PCP - General (Internal Medicine) [facility] Kimber RelicArthur G Green, MD as Consulting Physician (Internal Medicine) Dorothymae Maciver Johnney OuX Kiaraliz Rafuse, NP as Nurse Practitioner (Internal Medicine)  Extended Emergency Contact Information [facility] Emergency Contact: [facility] Address: [address]          [place], [place] [address] [place] Home Phone: [phone] Relation: Son Secondary Emergency Contact: [name],[name] Address: [address]          [place], [place] [address] [place] Home Phone: [phone] Relation: Daughter  Code Status:  DNR Goals of care: Advanced Directive information Advanced Directives 06/21/2016  Does Patient Have a Medical Advance Directive? Yes  Type of Estate agentAdvance Directive Healthcare Power of ChlorideAttorney;Living will;Out of facility DNR (pink MOST or yellow form)  Does patient want to make changes to medical advance directive? No - Patient declined  Copy of Healthcare Power of Attorney in Chart? Yes  Pre-existing out of facility DNR order (yellow form or pink MOST form) Yellow form placed in chart (order not valid for inpatient use)     Chief Complaint  Patient presents with  . Acute Visit    L heel-soft boggy reddend    HPI:  Pt is a 83 y.o. female seen today for an acute visit for left heel boggy reddened area, no openings, mild warmth noted.    Hx of dementia, taking Aricept, [facility], chronic multiple sites arthritis, ambulates with walker on unit, chronic edema is managed with daily Furosemide 20mg .   Past Medical History:  Diagnosis Date  . Abnormal mammogram, unspecified 10/25/2006  . Abnormality of gait 03/02/2011  . Allergic rhinitis due to pollen 07/13/2006  . Blood in stool 06/29/2011  . Candidiasis 07/01/2012  .  Cholelithiasis 08/02/2010  . Disturbance of skin sensation 11/25/2009  . Diverticulitis of colon (without mention of hemorrhage)(562.11) 10/28/2003  . Dizziness and giddiness 07/13/2006  . Edema 07/19/2006  . Lumbago 12/22/2009  . Memory loss 02/17/2010  . Osteoarthrosis, unspecified whether generalized or localized, unspecified site 07/19/2006  . Other and unspecified hyperlipidemia 03/28/2007  . Pain in joint, upper arm 10/25/2006  . Personal history of fall 12/22/2009  . Unspecified constipation 09/26/2007  . Unspecified essential hypertension 07/19/2006  . Unspecified vitamin D deficiency 08/19/2009  . Uterine prolapse without mention of vaginal wall prolapse 06/29/2011   Past Surgical History:  Procedure Laterality Date  . CATARACT EXTRACTION W/ INTRAOCULAR LENS  IMPLANT, BILATERAL Bilateral 1999    Allergies  Allergen Reactions  . Lopid [Gemfibrozil]   . Sulfa Antibiotics   . Zocor [Simvastatin]     Allergies as of 06/21/2016      Reactions   Lopid [gemfibrozil]    Sulfa Antibiotics    Zocor [simvastatin]       Medication List       Accurate as of 06/21/16  1:48 PM. Always use your most recent med list.          acetaminophen 325 MG tablet Commonly known as:  TYLENOL Take 325 mg by mouth every 6 (six) hours as needed for mild pain. Take two tablets every 4 hours as needed for pain   donepezil 10 MG tablet Commonly known as:  ARICEPT One daily to help preserve memory.   furosemide 20 MG tablet  Commonly known as:  LASIX Take 20 mg by mouth daily.   loratadine 10 MG tablet Commonly known as:  CLARITIN Take 10 mg by mouth daily.   TUMS 500 MG chewable tablet Generic drug:  calcium carbonate Chew 3 tablets by mouth daily. Take three tablets once daily   Vitamin D 1000 units capsule Take 1,000 Units by mouth. Take 2 daily       Review of Systems  Constitutional: Negative for activity change, appetite change, chills, diaphoresis and fatigue.  HENT: Positive for hearing  loss. Negative for congestion, ear pain, facial swelling, nosebleeds, postnasal drip, sneezing and tinnitus.   Eyes: Negative for photophobia, pain, discharge, itching and visual disturbance.  Respiratory: Negative for cough, choking, chest tightness, shortness of breath and stridor.   Cardiovascular: Positive for leg swelling. Negative for chest pain and palpitations.  Gastrointestinal: Positive for constipation. Negative for abdominal distention, abdominal pain, diarrhea and nausea.  Endocrine: Negative.   Genitourinary:       Incontinent  Musculoskeletal: Positive for arthralgias and gait problem. Negative for back pain, joint swelling, myalgias, neck pain and neck stiffness.       Hx fx right 5th MC  Skin: Negative for color change (Erythema of the distal legs).       Left heel boggy reddened area  Allergic/Immunologic: Negative.   Neurological: Negative for dizziness, tremors, seizures, syncope, facial asymmetry, speech difficulty, light-headedness, numbness and headaches.  Hematological: Negative.   Psychiatric/Behavioral: Positive for agitation, behavioral problems, confusion and decreased concentration. Negative for dysphoric mood, hallucinations, self-injury and sleep disturbance. The patient is nervous/anxious. The patient is not hyperactive.     Immunization History  Administered Date(s) Administered  . Influenza Whole 01/16/2012, 02/20/2013  . Influenza-Unspecified 02/19/2014, 01/05/2015, 02/01/2016  . PPD Test 08/23/2010, 08/04/2015  . Pneumococcal Polysaccharide-23 06/24/1998  . Td 05/02/1974   Pertinent  Health Maintenance Due  Topic Date Due  . DEXA SCAN  03/19/1986  . PNA vac Low Risk Adult (2 of 2 - PCV13) 06/24/1999  . INFLUENZA VACCINE  Completed   Fall Risk  08/06/2015 08/06/2015 11/19/2014 11/19/2014 05/21/2014  Falls in the past year? No No Yes No Yes  Number falls in past yr: - - 1 - -  Injury with Fall? - - No - -   Functional Status Survey:    Vitals:    06/21/16 1216  BP: (!) 132/54  Pulse: (!) 52  Resp: 14  Temp: 98.3 F (36.8 C)  Weight: 142 lb 3.2 oz (64.5 kg)  Height: 5\' 3"  (1.6 m)   Body mass index is 25.19 kg/m. Physical Exam  Constitutional:  overweight  HENT:  Mild loss of hearing  Eyes:  Corrective lenses.  Neck: No JVD present. No thyromegaly present.  Cardiovascular: Normal rate, regular rhythm, normal heart sounds and intact distal pulses.  Exam reveals no gallop and no friction rub.   No murmur heard. Pulmonary/Chest: Effort normal and breath sounds normal. No respiratory distress. She has no wheezes. She has no rales. She exhibits no tenderness.  Abdominal: Bowel sounds are normal. She exhibits no distension and no mass. There is no tenderness.  Musculoskeletal: Normal range of motion. She exhibits edema. She exhibits no tenderness.  Unstable gait; using 4 wheel walker.   Lymphadenopathy:    She has no cervical adenopathy.  Neurological: She is alert. No cranial nerve deficit. Coordination normal.  10/23/13 MMSE 17/30. Failed clock drawing.  Skin: No rash noted. No erythema (diatal legs bilaterally). No pallor.  Left  heel boggy reddened area   Psychiatric: She has a normal mood and affect.    Labs reviewed:  Recent Labs  08/03/15 11/10/15 03/14/16  NA 137 140  140 139  K 4.1 3.9 4.1  BUN 20 22* 19  CREATININE 1.2* 1.4* 1.3*    Recent Labs  08/03/15 03/14/16  AST 21 22  ALT 17 17  ALKPHOS 80 88    Recent Labs  08/03/15 03/14/16  WBC 6.5 7.1  HGB 13.8 14.2  HCT 40 42  PLT 211 257   Lab Results  Component Value Date   TSH 2.72 08/04/2015   Lab Results  Component Value Date   HGBA1C 5.7 03/10/2014   Lab Results  Component Value Date   CHOL 297 (A) 04/01/2013   HDL 54 04/01/2013   LDLCALC 200 04/01/2013   TRIG 214 (A) 04/01/2013    Significant Diagnostic Results in last 30 days:  No results found.  Assessment/Plan Unstageable pressure injury of skin and tissue (HCC) Skin prep  to toughen the area, pressure reduction, observe.   Essential hypertension Controlled. Continue Furosemide 20mg  daily.  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 Continue Furosemide 20mg  daily.     Family/ staff Communication: SNF [facility]  Labs/tests ordered:  none

## 2016-06-21 NOTE — Assessment & Plan Note (Signed)
Not apparent today. Continue Furosemide 20mg daily.   

## 2016-06-21 NOTE — Assessment & Plan Note (Signed)
Multiple sites, prn Tylenol, ambulates with walker, frequent resting   

## 2016-07-17 ENCOUNTER — Encounter: Payer: Self-pay | Admitting: Internal Medicine

## 2016-07-17 ENCOUNTER — Non-Acute Institutional Stay (SKILLED_NURSING_FACILITY): Payer: Medicare Other | Admitting: Internal Medicine

## 2016-07-17 DIAGNOSIS — I10 Essential (primary) hypertension: Secondary | ICD-10-CM

## 2016-07-17 DIAGNOSIS — F0281 Dementia in other diseases classified elsewhere with behavioral disturbance: Secondary | ICD-10-CM

## 2016-07-17 DIAGNOSIS — N181 Chronic kidney disease, stage 1: Secondary | ICD-10-CM

## 2016-07-17 DIAGNOSIS — R609 Edema, unspecified: Secondary | ICD-10-CM | POA: Diagnosis not present

## 2016-07-17 DIAGNOSIS — F02818 Dementia in other diseases classified elsewhere, unspecified severity, with other behavioral disturbance: Secondary | ICD-10-CM

## 2016-07-17 NOTE — Progress Notes (Signed)
Progress Note    Location:  [facility] Room Number: [phone] Place of Service:  SNF [phone]) Provider:  [name], MD  Patient Care Team: [name], MD as PCP - General (Internal Medicine) [facility] [name], MD as Consulting Physician (Internal Medicine) Man [name], NP as Nurse Practitioner (Internal Medicine)  Extended Emergency Contact Information Primary Emergency Contact: [facility] Address: [address]          [place], [place] [address] [name] Home Phone: [phone] Relation: Son Secondary Emergency Contact: [name] Address: [address]          [place], [place] [address] [place] Home Phone: [phone] Relation: Daughter  Code Status:  DNR Goals of care: Advanced Directive information Advanced Directives 07/17/2016  Does Patient Have a Medical Advance Directive? Yes  Type of Estate agent of [place];Living will;Out of facility DNR (pink MOST or yellow form)  Does patient want to make changes to medical advance directive? -  Copy of Healthcare Power of Attorney in Chart? Yes  Pre-existing out of facility DNR order (yellow form or pink MOST form) Yellow form placed in chart (order not valid for inpatient use)     Chief Complaint  Patient presents with  . Medical Management of Chronic Issues    routine visit    HPI:  Pt is a 83 y.o. female seen today for medical management of chronic diseases.    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 Behavior seems improved. Calmer.  Essential hypertension - controlled  Stage 1 chronic kidney disease - unchanged  Edema, unspecified type - improved     Past Medical History:  Diagnosis Date  . Abnormal mammogram, unspecified 10/25/2006  . Abnormality of gait 03/02/2011  . Allergic rhinitis due to pollen 07/13/2006  . Blood in stool 06/29/2011  . Candidiasis 07/01/2012  .  Cholelithiasis 08/02/2010  . Disturbance of skin sensation 11/25/2009  . Diverticulitis of colon (without mention of hemorrhage)(562.11) 10/28/2003  . Dizziness and giddiness 07/13/2006  . Edema 07/19/2006  . Lumbago 12/22/2009  . Memory loss 02/17/2010  . Osteoarthrosis, unspecified whether generalized or localized, unspecified site 07/19/2006  . Other and unspecified hyperlipidemia 03/28/2007  . Pain in joint, upper arm 10/25/2006  . Personal history of fall 12/22/2009  . Unspecified constipation 09/26/2007  . Unspecified essential hypertension 07/19/2006  . Unspecified vitamin D deficiency 08/19/2009  . Uterine prolapse without mention of vaginal wall prolapse 06/29/2011   Past Surgical History:  Procedure Laterality Date  . CATARACT EXTRACTION W/ INTRAOCULAR LENS  IMPLANT, BILATERAL Bilateral 1999    Allergies  Allergen Reactions  . Lopid [Gemfibrozil]   . Sulfa Antibiotics   . Zocor [Simvastatin]     Allergies as of 07/17/2016      Reactions   Lopid [gemfibrozil]    Sulfa Antibiotics    Zocor [simvastatin]       Medication List       Accurate as of 07/17/16  2:50 PM. Always use your most recent med list.          acetaminophen 325 MG tablet Commonly known as:  TYLENOL Take 325 mg by mouth every 6 (six) hours as needed for mild pain. Take two tablets every 4 hours as needed for pain   donepezil 10 MG tablet Commonly known as:  ARICEPT One daily to help preserve memory.   furosemide 20 MG tablet Commonly known as:  LASIX Take 20 mg by mouth daily.   loratadine 10 MG tablet Commonly known as:  CLARITIN Take 10 mg by mouth daily.   magnesium hydroxide 400 MG/5ML suspension Commonly known as:  MILK OF MAGNESIA Take by mouth. Take 30 ml daily once daily as needed for constipation   TUMS 500 MG chewable tablet Generic drug:  calcium carbonate Chew 3 tablets by mouth daily. Take three tablets once daily   Vitamin D 1000 units capsule Take 1,000 Units by mouth. Take 2 daily         Review of Systems  Constitutional: Negative for activity change, appetite change, chills, diaphoresis and fatigue.  HENT: Positive for hearing loss. Negative for congestion, ear pain, facial swelling, nosebleeds, postnasal drip, sneezing and tinnitus.   Eyes: Negative for photophobia, pain, discharge, itching and visual disturbance.  Respiratory: Negative for cough, choking, chest tightness, shortness of breath and stridor.   Cardiovascular: Positive for leg swelling. Negative for chest pain and palpitations.  Gastrointestinal: Positive for constipation. Negative for abdominal distention, abdominal pain, diarrhea and nausea.  Endocrine: Negative.   Genitourinary:       Incontinent  Musculoskeletal: Positive for arthralgias and gait problem. Negative for back pain, joint swelling, myalgias, neck pain and neck stiffness.       Hx fx right 5th MC  Skin: Negative for color change (Erythema of the distal legs).  Allergic/Immunologic: Negative.   Neurological: Negative for dizziness, tremors, seizures, syncope, facial asymmetry, speech difficulty, light-headedness, numbness and headaches.  Hematological: Negative.   Psychiatric/Behavioral: Positive for agitation, behavioral problems, confusion and decreased concentration. Negative for dysphoric mood, hallucinations, self-injury and sleep disturbance. The patient is nervous/anxious. The patient is not hyperactive.     Immunization History  Administered Date(s) Administered  . Influenza Whole 01/16/2012, 02/20/2013  . Influenza-Unspecified 02/19/2014, 01/05/2015, 02/01/2016  . PPD Test 08/23/2010, 08/04/2015  . Pneumococcal Polysaccharide-23 06/24/1998  . Td 05/02/1974   Pertinent  Health Maintenance Due  Topic Date Due  . DEXA SCAN  03/19/1986  . PNA vac Low Risk Adult (2 of 2 - PCV13) 06/24/1999  . INFLUENZA VACCINE  11/15/2016   Fall Risk  08/06/2015 08/06/2015 11/19/2014 11/19/2014 05/21/2014  Falls in the past year? No No Yes No Yes   Number falls in past yr: - - 1 - -  Injury with Fall? - - No - -   Vitals:   07/17/16 1445  BP: 118/62  Pulse: 68  Resp: 16  Temp: 97.7 F (36.5 C)  Weight: 142 lb 12.8 oz (64.8 kg)  Height:  (1.6 m)   Body mass index is 25.3 kg/m. Physical Exam  Constitutional:  overweight  HENT:  Mild loss of hearing  Eyes:  Corrective lenses.  Neck: No JVD present. No thyromegaly present.  Cardiovascular: Normal rate, regular rhythm, normal heart sounds and intact distal pulses.  Exam reveals no gallop and no friction rub.   No murmur heard. Pulmonary/Chest: Effort normal and breath sounds normal. No respiratory distress. She has no wheezes. She has no rales. She exhibits no tenderness.  Abdominal: Bowel sounds are normal. She exhibits no distension and no mass. There is no tenderness.  Musculoskeletal: Normal range of motion. She exhibits edema. She exhibits no tenderness.  Unstable gait; using 4 wheel walker.   Lymphadenopathy:    She has no cervical adenopathy.  Neurological: She is alert. No cranial nerve deficit. Coordination normal.  10/23/13 MMSE 17/30. Failed clock drawing.  Skin: No rash noted. No erythema (diatal legs  bilaterally). No pallor.  Left heel boggy reddened area   Psychiatric: She has a normal mood and affect.    Labs reviewed:  Recent Labs  08/03/15 11/10/15 03/14/16  NA 137 140  140 139  K 4.1 3.9 4.1  BUN 20 22* 19  CREATININE 1.2* 1.4* 1.3*    Recent Labs  08/03/15 03/14/16  AST 21 22  ALT 17 17  ALKPHOS 80 88    Recent Labs  08/03/15 03/14/16  WBC 6.5 7.1  HGB 13.8 14.2  HCT 40 42  PLT 211 257   Lab Results  Component Value Date   TSH 2.72 08/04/2015   Lab Results  Component Value Date   HGBA1C 5.7 03/10/2014   Lab Results  Component Value Date   CHOL 297 (A) 04/01/2013   HDL 54 04/01/2013   LDLCALC 200 04/01/2013   TRIG 214 (A) 04/01/2013    Assessment/Plan 1. Dementia due to medical condition with behavioral  disturbance stable  2. Essential hypertension controlled  3. Stage 1 chronic kidney disease stable  4. Edema, unspecified type improved

## 2016-07-27 ENCOUNTER — Non-Acute Institutional Stay (SKILLED_NURSING_FACILITY): Payer: Medicare Other | Admitting: Nurse Practitioner

## 2016-07-27 ENCOUNTER — Encounter: Payer: Self-pay | Admitting: Nurse Practitioner

## 2016-07-27 DIAGNOSIS — F0281 Dementia in other diseases classified elsewhere with behavioral disturbance: Secondary | ICD-10-CM | POA: Diagnosis not present

## 2016-07-27 DIAGNOSIS — M159 Polyosteoarthritis, unspecified: Secondary | ICD-10-CM

## 2016-07-27 DIAGNOSIS — B3749 Other urogenital candidiasis: Secondary | ICD-10-CM | POA: Insufficient documentation

## 2016-07-27 DIAGNOSIS — F02818 Dementia in other diseases classified elsewhere, unspecified severity, with other behavioral disturbance: Secondary | ICD-10-CM

## 2016-07-27 DIAGNOSIS — N181 Chronic kidney disease, stage 1: Secondary | ICD-10-CM

## 2016-07-27 DIAGNOSIS — M150 Primary generalized (osteo)arthritis: Secondary | ICD-10-CM

## 2016-07-27 DIAGNOSIS — I10 Essential (primary) hypertension: Secondary | ICD-10-CM

## 2016-07-27 DIAGNOSIS — R609 Edema, unspecified: Secondary | ICD-10-CM

## 2016-07-27 NOTE — Assessment & Plan Note (Signed)
continue  Aricept, SNF for care needs

## 2016-07-27 NOTE — Assessment & Plan Note (Signed)
Multiple sites, prn Tylenol, ambulates with walker, frequent resting   

## 2016-07-27 NOTE — Assessment & Plan Note (Signed)
Controlled. Continue Furosemide  daily.

## 2016-07-27 NOTE — Assessment & Plan Note (Signed)
Not apparent today. Continue Furosemide 20mg daily.   

## 2016-07-27 NOTE — Assessment & Plan Note (Signed)
Last creat 1.31 03/14/16

## 2016-07-27 NOTE — Progress Notes (Signed)
Location:  [facility] Room Number: 111 Place of Service:  SNF (31) Provider:  Mohsin Crum, Manxie  NP  [name], MD  Patient Care Team: [name], MD as PCP - General (Internal Medicine) [facility] [name], MD as Consulting Physician (Internal Medicine) Khyra Viscuso [name], NP as Nurse Practitioner (Internal Medicine)  Extended Emergency Contact Information Primary Emergency Contact: [facility] Address: [address]          [place], [place] [address] [place] Home Phone: [phone] Relation: Son Secondary Emergency Contact: [name],[name] Address: [address]          [place], [place] [address] [place] Home Phone: [phone] Relation: Daughter  Code Status:  DNR Goals of care: Advanced Directive information Advanced Directives 07/27/2016  Does Patient Have a Medical Advance Directive? Yes  Type of Estate agent of [place];Living will;Out of facility DNR (pink MOST or yellow form)  Does patient want to make changes to medical advance directive? No - Patient declined  Copy of Healthcare Power of Attorney in Chart? Yes  Pre-existing out of facility DNR order (yellow form or pink MOST form) Yellow form placed in chart (order not valid for inpatient use)     Chief Complaint  Patient presents with  . Acute Visit    Redness to perineum w/ yellow discharge.    HPI:  Pt is a 83 y.o. female seen today for an acute visit for urogenital area redness, no vaginal discharge upon my examination today, duration unknown, unable to obtain reliable HPI due to her advanced dementia.     Hx of dementia, taking Aricept, resides Memory Care unit [facility], chronic multiple sites arthritis, ambulates with walker on unit, chronic edema is managed with daily Furosemide .    Past Medical History:  Diagnosis Date  . Abnormal mammogram, unspecified 10/25/2006  . Abnormality of gait 03/02/2011  .  Allergic rhinitis due to pollen 07/13/2006  . Blood in stool 06/29/2011  . Candidiasis 07/01/2012  . Cholelithiasis 08/02/2010  . Disturbance of skin sensation 11/25/2009  . Diverticulitis of colon (without mention of hemorrhage)(562.11) 10/28/2003  . Dizziness and giddiness 07/13/2006  . Edema 07/19/2006  . Lumbago 12/22/2009  . Memory loss 02/17/2010  . Osteoarthrosis, unspecified whether generalized or localized, unspecified site 07/19/2006  . Other and unspecified hyperlipidemia 03/28/2007  . Pain in joint, upper arm 10/25/2006  . Personal history of fall 12/22/2009  . Unspecified constipation 09/26/2007  . Unspecified essential hypertension 07/19/2006  . Unspecified vitamin D deficiency 08/19/2009  . Uterine prolapse without mention of vaginal wall prolapse 06/29/2011   Past Surgical History:  Procedure Laterality Date  . CATARACT EXTRACTION W/ INTRAOCULAR LENS  IMPLANT, BILATERAL Bilateral 1999    Allergies  Allergen Reactions  . Lopid [Gemfibrozil]   . Sulfa Antibiotics   . Zocor [Simvastatin]     Outpatient Encounter Prescriptions as of 07/27/2016  Medication Sig  . acetaminophen (TYLENOL) 325 MG tablet Take 325 mg by mouth every 6 (six) hours as needed for mild pain. Take two tablets every 4 hours as needed for pain  . calcium carbonate (TUMS) 500 MG chewable tablet Chew 3 tablets by mouth daily. Take three tablets once daily  . Cholecalciferol (VITAMIN D) 1000 UNITS capsule Take 1,000 Units by mouth. Take 2 daily  . donepezil (ARICEPT) 10 MG tablet One daily to help preserve memory.  . furosemide (LASIX) 20 MG tablet Take 20 mg by mouth daily.  Marland Kitchen  loratadine (CLARITIN) 10 MG tablet Take 10 mg by mouth daily.  . magnesium hydroxide (MILK OF MAGNESIA) 400 MG/5ML suspension Take by mouth. Take 30 ml daily once daily as needed for constipation   No facility-administered encounter medications on file as of 07/27/2016.     Review of Systems  Constitutional: Negative for activity change,  appetite change, chills, diaphoresis and fatigue.  HENT: Positive for hearing loss. Negative for congestion, ear pain, facial swelling, nosebleeds, postnasal drip, sneezing and tinnitus.   Eyes: Negative for photophobia, pain, discharge, itching and visual disturbance.  Respiratory: Negative for cough, choking, chest tightness, shortness of breath and stridor.   Cardiovascular: Positive for leg swelling. Negative for chest pain and palpitations.  Gastrointestinal: Positive for constipation. Negative for abdominal distention, abdominal pain, diarrhea and nausea.  Endocrine: Negative.   Genitourinary:       Incontinent  Musculoskeletal: Positive for arthralgias and gait problem. Negative for back pain, joint swelling, myalgias, neck pain and neck stiffness.       Hx fx right 5th MC  Skin: Negative for color change (Erythema of the distal legs).       Urogenital area redness.   Allergic/Immunologic: Negative.   Neurological: Negative for dizziness, tremors, seizures, syncope, facial asymmetry, speech difficulty, light-headedness, numbness and headaches.  Hematological: Negative.   Psychiatric/Behavioral: Positive for agitation, behavioral problems, confusion and decreased concentration. Negative for dysphoric mood, hallucinations, self-injury and sleep disturbance. The patient is nervous/anxious. The patient is not hyperactive.     Immunization History  Administered Date(s) Administered  . Influenza Whole 01/16/2012, 02/20/2013  . Influenza-Unspecified 02/19/2014, 01/05/2015, 02/01/2016  . PPD Test 08/23/2010, 08/04/2015  . Pneumococcal Polysaccharide-23 06/24/1998  . Td 05/02/1974   Pertinent  Health Maintenance Due  Topic Date Due  . DEXA SCAN  03/19/1986  . PNA vac Low Risk Adult (2 of 2 - PCV13) 06/24/1999  . INFLUENZA VACCINE  11/15/2016   Fall Risk  08/06/2015 08/06/2015 11/19/2014 11/19/2014 05/21/2014  Falls in the past year? No No Yes No Yes  Number falls in past yr: - - 1 - -    Injury with Fall? - - No - -   Functional Status Survey:    Vitals:   07/27/16 1117  BP: 110/60  Pulse: 70  Resp: 16  Temp: 97.7 F (36.5 C)  Weight: 139 lb 12.8 oz (63.4 kg)  Height:  (1.6 m)   Body mass index is 24.76 kg/m. Physical Exam  Constitutional:  overweight  HENT:  Mild loss of hearing  Eyes:  Corrective lenses.  Neck: No JVD present. No thyromegaly present.  Cardiovascular: Normal rate, regular rhythm, normal heart sounds and intact distal pulses.  Exam reveals no gallop and no friction rub.   No murmur heard. Pulmonary/Chest: Effort normal and breath sounds normal. No respiratory distress. She has no wheezes. She has no rales. She exhibits no tenderness.  Abdominal: Bowel sounds are normal. She exhibits no distension and no mass. There is no tenderness.  Musculoskeletal: Normal range of motion. She exhibits edema. She exhibits no tenderness.  Unstable gait; using 4 wheel walker.   Lymphadenopathy:    She has no cervical adenopathy.  Neurological: She is alert. No cranial nerve deficit. Coordination normal.  10/23/13 MMSE 17/30. Failed clock drawing.  Skin: No rash noted. No erythema (diatal legs bilaterally). No pallor.  Urogenital area redness.    Psychiatric: She has a normal mood and affect.    Labs reviewed:  Recent Labs  08/03/15 11/10/15 03/14/16  NA 137 140  140 139  K 4.1 3.9 4.1  BUN 20 22* 19  CREATININE 1.2* 1.4* 1.3*    Recent Labs  08/03/15 03/14/16  AST 21 22  ALT 17 17  ALKPHOS 80 88    Recent Labs  08/03/15 03/14/16  WBC 6.5 7.1  HGB 13.8 14.2  HCT 40 42  PLT 211 257   Lab Results  Component Value Date   TSH 2.72 08/04/2015   Lab Results  Component Value Date   HGBA1C 5.7 03/10/2014   Lab Results  Component Value Date   CHOL 297 (A) 04/01/2013   HDL 54 04/01/2013   LDLCALC 200 04/01/2013   TRIG 214 (A) 04/01/2013    Significant Diagnostic Results in last 30 days:  No results  found.  Assessment/Plan Urogenital candidiasis Apply Nystatin powder bid to urogenital and groins, observe the patient.   Edema Not apparent today. Continue Furosemide  daily.    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 Continue Furosemide  daily.     Family/ staff Communication: SNF MCU  Labs/tests ordered:  none

## 2016-07-27 NOTE — Assessment & Plan Note (Signed)
Apply Nystatin powder bid to urogenital and groins, observe the patient.

## 2016-08-28 ENCOUNTER — Non-Acute Institutional Stay (SKILLED_NURSING_FACILITY): Payer: Medicare Other | Admitting: Nurse Practitioner

## 2016-08-28 ENCOUNTER — Encounter: Payer: Self-pay | Admitting: Nurse Practitioner

## 2016-08-28 DIAGNOSIS — F0281 Dementia in other diseases classified elsewhere with behavioral disturbance: Secondary | ICD-10-CM | POA: Diagnosis not present

## 2016-08-28 DIAGNOSIS — M150 Primary generalized (osteo)arthritis: Secondary | ICD-10-CM

## 2016-08-28 DIAGNOSIS — R609 Edema, unspecified: Secondary | ICD-10-CM

## 2016-08-28 DIAGNOSIS — F02818 Dementia in other diseases classified elsewhere, unspecified severity, with other behavioral disturbance: Secondary | ICD-10-CM

## 2016-08-28 DIAGNOSIS — N181 Chronic kidney disease, stage 1: Secondary | ICD-10-CM

## 2016-08-28 DIAGNOSIS — I10 Essential (primary) hypertension: Secondary | ICD-10-CM | POA: Diagnosis not present

## 2016-08-28 DIAGNOSIS — M159 Polyosteoarthritis, unspecified: Secondary | ICD-10-CM

## 2016-08-28 NOTE — Assessment & Plan Note (Signed)
Last creat 1.31 03/14/16, update CMP

## 2016-08-28 NOTE — Assessment & Plan Note (Signed)
continue  Aricept, SNF MCU  for care needs

## 2016-08-28 NOTE — Assessment & Plan Note (Signed)
Multiple sites, prn Tylenol, ambulates with walker, frequent resting   

## 2016-08-28 NOTE — Assessment & Plan Note (Signed)
Controlled. Continue Furosemide 20mg  daily. Update CBC CMP TSH

## 2016-08-28 NOTE — Assessment & Plan Note (Signed)
BLE not apparent today. Continue Furosemide 20mg  daily.

## 2016-08-28 NOTE — Progress Notes (Signed)
Location:  [facility] Room Number: 111 Place of Service:  SNF (31) Provider:  Aunesty Tyson, Manxie  NP  Kimber RelicGreen, Arthur G, MD  Patient Care Team: Kimber RelicGreen, Arthur G, MD as PCP - General (Internal Medicine) [facility] Si[facility], Lenon CurtArthur G, MD as Consulting Physician (Internal Medicine) Cathren Sween X, NP as Nurse Practitioner (Internal Medicine)  Extended Emergency Contact Information [facility]: [facility] Address: [address]          [place], [place] [address] [place] Home Phone: [phone] Relation: Son Secondary Emergency Contact: [name],[name] Address: [address]          [place], [place] [address] [place] Home Phone: [phone] Relation: Daughter  Code Status:  DNR Goals of care: Advanced Directive information Advanced Directives 08/28/2016  Does Patient Have a Medical Advance Directive? Yes  Type of Estate agentAdvance Directive Healthcare Power of [place];Living will;Out of facility DNR (pink MOST or yellow form)  Does patient want to make changes to medical advance directive? No - Patient declined  Copy of Healthcare Power of Attorney in Chart? Yes  Pre-existing out of facility DNR order (yellow form or pink MOST form) Yellow form placed in chart (order not valid for inpatient use)     Chief Complaint  Patient presents with  . Medical Management of Chronic Issues    HPI:  Pt is a 83 y.o. female seen today for evaluation of her chronic medical conditions    Hx of dementia, taking Aricept, [facility], chronic multiple sites arthritis, ambulates with walker on unit, chronic edema is managed with daily Furosemide 20mg .    Past Medical History:  Diagnosis Date  . Abnormal mammogram, unspecified 10/25/2006  . Abnormality of gait 03/02/2011  . Allergic rhinitis due to pollen 07/13/2006  . Blood in stool 06/29/2011  . Candidiasis 07/01/2012  . Cholelithiasis 08/02/2010  . Disturbance of  skin sensation 11/25/2009  . Diverticulitis of colon (without mention of hemorrhage)(562.11) 10/28/2003  . Dizziness and giddiness 07/13/2006  . Edema 07/19/2006  . Lumbago 12/22/2009  . Memory loss 02/17/2010  . Osteoarthrosis, unspecified whether generalized or localized, unspecified site 07/19/2006  . Other and unspecified hyperlipidemia 03/28/2007  . Pain in joint, upper arm 10/25/2006  . Personal history of fall 12/22/2009  . Unspecified constipation 09/26/2007  . Unspecified essential hypertension 07/19/2006  . Unspecified vitamin D deficiency 08/19/2009  . Uterine prolapse without mention of vaginal wall prolapse 06/29/2011   Past Surgical History:  Procedure Laterality Date  . CATARACT EXTRACTION W/ INTRAOCULAR LENS  IMPLANT, BILATERAL Bilateral 1999    Allergies  Allergen Reactions  . Lopid [Gemfibrozil]   . Sulfa Antibiotics   . Zocor [Simvastatin]     Outpatient Encounter Prescriptions as of 08/28/2016  Medication Sig  . acetaminophen (TYLENOL) 325 MG tablet Take 325 mg by mouth every 6 (six) hours as needed for mild pain. Take two tablets every 4 hours as needed for pain  . calcium carbonate (TUMS) 500 MG chewable tablet Chew 3 tablets by mouth daily. Take three tablets once daily  . Cholecalciferol (VITAMIN D) 1000 UNITS capsule Take 1,000 Units by mouth. Take 2 daily  . donepezil (ARICEPT) 10 MG tablet One daily to help preserve memory.  . furosemide (LASIX) 20 MG tablet Take 20 mg by mouth daily.  Marland Kitchen. loratadine (CLARITIN) 10 MG tablet Take 10 mg by mouth daily.  Marland Kitchen. nystatin (NYSTATIN) powder Apply topically 2 (two) times daily.  .Marland Kitchen  magnesium hydroxide (MILK OF MAGNESIA) 400 MG/5ML suspension Take by mouth. Take 30 ml daily once daily as needed for constipation   No facility-administered encounter medications on file as of 08/28/2016.     Review of Systems  Constitutional: Negative for activity change, appetite change, chills, diaphoresis and fatigue.  HENT: Positive for hearing  loss. Negative for congestion, ear pain, facial swelling, nosebleeds, postnasal drip, sneezing and tinnitus.   Eyes: Negative for photophobia, pain, discharge, itching and visual disturbance.  Respiratory: Negative for cough, choking, chest tightness, shortness of breath and stridor.   Cardiovascular: Positive for leg swelling. Negative for chest pain and palpitations.  Gastrointestinal: Positive for constipation. Negative for abdominal distention, abdominal pain, diarrhea and nausea.  Endocrine: Negative.   Genitourinary:       Incontinent  Musculoskeletal: Positive for arthralgias and gait problem. Negative for back pain, joint swelling, myalgias, neck pain and neck stiffness.       Hx fx right 5th MC  Skin: Negative for color change (Erythema of the distal legs).       Urogenital area redness.   Allergic/Immunologic: Negative.   Neurological: Negative for dizziness, tremors, seizures, syncope, facial asymmetry, speech difficulty, light-headedness, numbness and headaches.  Hematological: Negative.   Psychiatric/Behavioral: Positive for confusion. Negative for agitation, behavioral problems, decreased concentration, dysphoric mood, hallucinations, self-injury and sleep disturbance. The patient is nervous/anxious. The patient is not hyperactive.     Immunization History  Administered Date(s) Administered  . Influenza Whole 01/16/2012, 02/20/2013  . Influenza-Unspecified 02/19/2014, 01/05/2015, 02/01/2016  . PPD Test 08/23/2010, 08/04/2015  . Pneumococcal Polysaccharide-23 06/24/1998  . Td 05/02/1974   Pertinent  Health Maintenance Due  Topic Date Due  . DEXA SCAN  03/19/1986  . PNA vac Low Risk Adult (2 of 2 - PCV13) 06/24/1999  . INFLUENZA VACCINE  11/15/2016   Fall Risk  08/06/2015 08/06/2015 11/19/2014 11/19/2014 05/21/2014  Falls in the past year? No No Yes No Yes  Number falls in past yr: - - 1 - -  Injury with Fall? - - No - -   Functional Status Survey:    Vitals:   08/28/16  1342  BP: 118/60  Pulse: 74  Resp: (!) 22  Temp: 97.6 F (36.4 C)  Weight: 141 lb 9.6 oz (64.2 kg)  Height: 5\' 3"  (1.6 m)   Body mass index is 25.08 kg/m. Physical Exam  Constitutional:  overweight  HENT:  Mild loss of hearing  Eyes:  Corrective lenses.  Neck: No JVD present. No thyromegaly present.  Cardiovascular: Normal rate, regular rhythm, normal heart sounds and intact distal pulses.  Exam reveals no gallop and no friction rub.   No murmur heard. Pulmonary/Chest: Effort normal and breath sounds normal. No respiratory distress. She has no wheezes. She has no rales. She exhibits no tenderness.  Abdominal: Bowel sounds are normal. She exhibits no distension and no mass. There is no tenderness.  Musculoskeletal: Normal range of motion. She exhibits edema. She exhibits no tenderness.  Unstable gait; using 4 wheel walker.   Lymphadenopathy:    She has no cervical adenopathy.  Neurological: She is alert. No cranial nerve deficit. Coordination normal.  10/23/13 MMSE 17/30. Failed clock drawing.  Skin: No rash noted. No erythema (diatal legs bilaterally). No pallor.  Urogenital area redness.    Psychiatric: She has a normal mood and affect.    Labs reviewed:  Recent Labs  11/10/15 03/14/16  NA 140  140 139  K 3.9 4.1  BUN 22* 19  CREATININE 1.4* 1.3*    Recent Labs  03/14/16  AST 22  ALT 17  ALKPHOS 88    Recent Labs  03/14/16  WBC 7.1  HGB 14.2  HCT 42  PLT 257   Lab Results  Component Value Date   TSH 2.72 08/04/2015   Lab Results  Component Value Date   HGBA1C 5.7 03/10/2014   Lab Results  Component Value Date   CHOL 297 (A) 04/01/2013   HDL 54 04/01/2013   LDLCALC 200 04/01/2013   TRIG 214 (A) 04/01/2013    Significant Diagnostic Results in last 30 days:  No results found.  Assessment/Plan Essential hypertension Controlled. Continue Furosemide 20mg  daily. Update CBC CMP TSH  Dementia due to medical condition with behavioral  disturbance continue  Aricept, SNF MCU  for care needs  Osteoarthritis (arthritis due to wear and tear of joints) Multiple sites, prn Tylenol, ambulates with walker, frequent resting  CKD (chronic kidney disease) Last creat 1.31 03/14/16, update CMP  Edema BLE not apparent today. Continue Furosemide 20mg  daily.      Family/ staff Communication: SNF MCU  Labs/tests ordered: CBC CMP TSH

## 2016-09-22 ENCOUNTER — Encounter: Payer: Self-pay | Admitting: Nurse Practitioner

## 2016-09-22 ENCOUNTER — Non-Acute Institutional Stay (SKILLED_NURSING_FACILITY): Payer: Medicare Other | Admitting: Nurse Practitioner

## 2016-09-22 DIAGNOSIS — R609 Edema, unspecified: Secondary | ICD-10-CM

## 2016-09-22 DIAGNOSIS — F02818 Dementia in other diseases classified elsewhere, unspecified severity, with other behavioral disturbance: Secondary | ICD-10-CM

## 2016-09-22 DIAGNOSIS — R4182 Altered mental status, unspecified: Secondary | ICD-10-CM | POA: Insufficient documentation

## 2016-09-22 DIAGNOSIS — I10 Essential (primary) hypertension: Secondary | ICD-10-CM | POA: Diagnosis not present

## 2016-09-22 DIAGNOSIS — R509 Fever, unspecified: Secondary | ICD-10-CM

## 2016-09-22 DIAGNOSIS — F0281 Dementia in other diseases classified elsewhere with behavioral disturbance: Secondary | ICD-10-CM | POA: Diagnosis not present

## 2016-09-22 LAB — BASIC METABOLIC PANEL
BUN: 39 — AB (ref 4–21)
CREATININE: 1.8 — AB (ref <=1.1)
Glucose: 115
Potassium: 4.3 (ref 3.4–5.3)
Sodium: 143 (ref 137–147)

## 2016-09-22 LAB — CBC AND DIFFERENTIAL
HEMATOCRIT: 41 (ref 36–46)
HEMOGLOBIN: 13.9 (ref 12.0–16.0)
Platelets: 228 (ref 150–399)
WBC: 12.7

## 2016-09-22 LAB — HEPATIC FUNCTION PANEL
ALK PHOS: 105 (ref 25–125)
ALT: 11 (ref 7–35)
AST: 15 (ref 13–35)
Bilirubin, Total: 0.7

## 2016-09-22 NOTE — Assessment & Plan Note (Signed)
Controlled.  Hold Furosemide 20mg  daily. Re-eval next Monday 2nd to decreased oral intake in setting of altered mental status. Observe.

## 2016-09-22 NOTE — Assessment & Plan Note (Signed)
Not apparent, hold Furosemide til next Monday due to decreased oral intake. Observe.

## 2016-09-22 NOTE — Assessment & Plan Note (Signed)
continue  Aricept, SNF MCU  for care needs

## 2016-09-22 NOTE — Progress Notes (Signed)
Location:  [facility] Room Number: 111 Place of Service:  SNF (31) Provider:  Mast, Manxie  NP  [name], MD  Patient Care Team: [name], MD as PCP - General (Internal Medicine) [place] Lenon Curt, MD as Consulting Physician (Internal Medicine) Mast, Man X, NP as Nurse Practitioner (Internal Medicine)  Extended Emergency Contact Information Primary Emergency Contact: [facility] Address: [address]          [place], [place] [address] [place] of [place] Home Phone: [phone] Relation: Son Secondary Emergency Contact: [name],[name] Address: [address]          [place], [place] [address] [place] Home Phone: [phone] Relation: Daughter  Code Status:  DNR Goals of care: Advanced Directive information Advanced Directives 08/28/2016  Does Patient Have a Medical Advance Directive? Yes  Type of Estate agent of [place];Living will;Out of facility DNR (pink MOST or yellow form)  Does patient want to make changes to medical advance directive? No - Patient declined  Copy of Healthcare Power of Attorney in Chart? Yes  Pre-existing out of facility DNR order (yellow form or pink MOST form) Yellow form placed in chart (order not valid for inpatient use)     Chief Complaint  Patient presents with  . Acute Visit    HPI:  Pt is a 83 y.o. female seen today for low grade T, Tylenol is effective, but the patient has appears weakness in general, didn't recognize her son and DIL, responded slower than her usual, but no focal neurological symptoms, no O2 desaturation, denied pain.     Hx of dementia, taking Aricept, resides Memory Care unit [facility], chronic multiple sites arthritis, ambulates with walker on unit, chronic edema is managed with daily Furosemide 20mg .    Past Medical History:  Diagnosis Date  . Abnormal mammogram, unspecified 10/25/2006  . Abnormality of gait 03/02/2011   . Allergic rhinitis due to pollen 07/13/2006  . Blood in stool 06/29/2011  . Candidiasis 07/01/2012  . Cholelithiasis 08/02/2010  . Disturbance of skin sensation 11/25/2009  . Diverticulitis of colon (without mention of hemorrhage)(562.11) 10/28/2003  . Dizziness and giddiness 07/13/2006  . Edema 07/19/2006  . Lumbago 12/22/2009  . Memory loss 02/17/2010  . Osteoarthrosis, unspecified whether generalized or localized, unspecified site 07/19/2006  . Other and unspecified hyperlipidemia 03/28/2007  . Pain in joint, upper arm 10/25/2006  . Personal history of fall 12/22/2009  . Unspecified constipation 09/26/2007  . Unspecified essential hypertension 07/19/2006  . Unspecified vitamin D deficiency 08/19/2009  . Uterine prolapse without mention of vaginal wall prolapse 06/29/2011   Past Surgical History:  Procedure Laterality Date  . CATARACT EXTRACTION W/ INTRAOCULAR LENS  IMPLANT, BILATERAL Bilateral 1999    Allergies  Allergen Reactions  . Lopid [Gemfibrozil]   . Sulfa Antibiotics   . Zocor [Simvastatin]     Outpatient Encounter Prescriptions as of 09/22/2016  Medication Sig  . acetaminophen (TYLENOL) 325 MG tablet Take 325 mg by mouth every 6 (six) hours as needed for mild pain. Take two tablets every 4 hours as needed for pain  . calcium carbonate (TUMS) 500 MG chewable tablet Chew 3 tablets by mouth daily. Take three tablets once daily  . Cholecalciferol (VITAMIN D) 1000 UNITS capsule Take 1,000 Units by mouth. Take 2 daily  . donepezil (ARICEPT) 10 MG tablet One daily to help preserve memory.  . furosemide (LASIX) 20 MG tablet Take 20 mg by mouth  daily.  . loratadine (CLARITIN) 10 MG tablet Take 10 mg by mouth daily.  . magnesium hydroxide (MILK OF MAGNESIA) 400 MG/5ML suspension Take by mouth. Take 30 ml daily once daily as needed for constipation  . nystatin (NYSTATIN) powder Apply topically 2 (two) times daily.   No facility-administered encounter medications on file as of 09/22/2016.      Review of Systems  Constitutional: Positive for appetite change, fatigue and fever. Negative for activity change, chills and diaphoresis.  HENT: Positive for hearing loss. Negative for congestion, ear pain, facial swelling, nosebleeds, postnasal drip, sneezing and tinnitus.   Eyes: Negative for photophobia, pain, discharge, itching and visual disturbance.  Respiratory: Negative for cough, choking, chest tightness, shortness of breath and stridor.   Cardiovascular: Negative for chest pain, palpitations and leg swelling.  Gastrointestinal: Positive for constipation. Negative for abdominal distention, abdominal pain, diarrhea and nausea.  Endocrine: Negative.   Genitourinary:       Incontinent  Musculoskeletal: Positive for arthralgias and gait problem. Negative for back pain, joint swelling, myalgias, neck pain and neck stiffness.       Hx fx right 5th MC  Skin: Negative for color change (Erythema of the distal legs).       Urogenital area redness.   Allergic/Immunologic: Negative.   Neurological: Negative for dizziness, tremors, seizures, syncope, facial asymmetry, speech difficulty, light-headedness, numbness and headaches.  Hematological: Negative.   Psychiatric/Behavioral: Positive for confusion. Negative for agitation, behavioral problems, decreased concentration, dysphoric mood, hallucinations, self-injury and sleep disturbance. The patient is nervous/anxious. The patient is not hyperactive.     Immunization History  Administered Date(s) Administered  . Influenza Whole 01/16/2012, 02/20/2013  . Influenza-Unspecified 02/19/2014, 01/05/2015, 02/01/2016  . PPD Test 08/23/2010, 08/04/2015  . Pneumococcal Polysaccharide-23 06/24/1998  . Td 05/02/1974   Pertinent  Health Maintenance Due  Topic Date Due  . DEXA SCAN  03/19/1986  . PNA vac Low Risk Adult (2 of 2 - PCV13) 06/24/1999  . INFLUENZA VACCINE  11/15/2016   Fall Risk  08/06/2015 08/06/2015 11/19/2014 11/19/2014 05/21/2014  Falls  in the past year? No No Yes No Yes  Number falls in past yr: - - 1 - -  Injury with Fall? - - No - -   Functional Status Survey:    Vitals:   09/22/16 1625  BP: 118/70  Pulse: 74  Resp: 16  Temp: 98.6 F (37 C)  Weight: 142 lb 6.4 oz (64.6 kg)  Height: 5\' 3"  (1.6 m)   Body mass index is 25.23 kg/m. Physical Exam  Constitutional:  overweight  HENT:  Mild loss of hearing  Eyes:  Corrective lenses.  Neck: No JVD present. No thyromegaly present.  Cardiovascular: Normal rate, regular rhythm, normal heart sounds and intact distal pulses.  Exam reveals no gallop and no friction rub.   No murmur heard. Pulmonary/Chest: Effort normal and breath sounds normal. No respiratory distress. She has no wheezes. She has no rales. She exhibits no tenderness.  Abdominal: Bowel sounds are normal. She exhibits no distension and no mass. There is no tenderness.  Musculoskeletal: Normal range of motion. She exhibits no edema or tenderness.  Unstable gait; using 4 wheel walker.   Lymphadenopathy:    She has no cervical adenopathy.  Neurological: She is alert. No cranial nerve deficit. Coordination normal.  10/23/13 MMSE 17/30. Failed clock drawing.  Skin: No rash noted. No erythema (diatal legs bilaterally). No pallor.  Urogenital area redness.    Psychiatric: She has a normal mood and  affect.    Labs reviewed:  Recent Labs  11/10/15 03/14/16  NA 140  140 139  K 3.9 4.1  BUN 22* 19  CREATININE 1.4* 1.3*    Recent Labs  03/14/16  AST 22  ALT 17  ALKPHOS 88    Recent Labs  03/14/16  WBC 7.1  HGB 14.2  HCT 42  PLT 257   Lab Results  Component Value Date   TSH 2.72 08/04/2015   Lab Results  Component Value Date   HGBA1C 5.7 03/10/2014   Lab Results  Component Value Date   CHOL 297 (A) 04/01/2013   HDL 54 04/01/2013   LDLCALC 200 04/01/2013   TRIG 214 (A) 04/01/2013    Significant Diagnostic Results in last 30 days:  No results found.  Assessment/Plan Altered  mental status grade T, Tylenol is effective, but the patient has appears weakness in general, didn't recognize her son and DIL, responded slower than her usual, but no focal neurological symptoms, no O2 desaturation, denied pain.  Obtain CBC CMP UA C/S, CXR observe the patient.   Fever Continue prn Tylenol, obtain CBC CMP UA C/S CXR, empirical ABT Augmentin 875mg  q12h x 7 days, may stop if urine culture is negative for UTI. Observe the patient.   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  Hold Furosemide 20mg  daily. Re-eval next Monday 2nd to decreased oral intake in setting of altered mental status. Observe.   Edema Not apparent, hold Furosemide til next Monday due to decreased oral intake. Observe.       Family/ staff Communication: SNF MCU  Labs/tests ordered: CBC CMP UA C/S CXR

## 2016-09-22 NOTE — Progress Notes (Signed)
[facility] Room Number: 111 Place of Service:  SNF (31) Provider: Mast, Manxie  NP  Kimber RelicGreen, Arthur G, MD  Patient Care Team: Kimber RelicGreen, Arthur G, MD as PCP - General (Internal Medicine) [place] Si[facility], Lenon CurtArthur G, MD as Consulting Physician (Internal Medicine) Mast, Man X, NP as Nurse Practitioner (Internal Medicine)  Extended Emergency Contact Information [facility] Address: [address]          [place], [place] [address] [place] Home Phone: [phone] Relation: Son Secondary Emergency Contact: [name],[name] Address: [address]          [place], [place] [address] [place] Home Phone: [phone] Relation: Daughter  Code Status:  DNR Goals of care: Advanced Directive information Advanced Directives 09/22/2016  Does Patient Have a Medical Advance Directive? Yes  Type of Estate agentAdvance Directive Healthcare Power of [place];Living will;Out of facility DNR (pink MOST or yellow form)  Does patient want to make changes to medical advance directive? No - Patient declined  Copy of Healthcare Power of Attorney in Chart? Yes  Pre-existing out of facility DNR order (yellow form or pink MOST form) Yellow form placed in chart (order not valid for inpatient use)     Chief Complaint  Patient presents with  . Acute Visit    HPI:  Pt is a 83 y.o. female seen today for an acute visit for    Past Medical History:  Diagnosis Date  . Abnormal mammogram, unspecified 10/25/2006  . Abnormality of gait 03/02/2011  . Allergic rhinitis due to pollen 07/13/2006  . Blood in stool 06/29/2011  . Candidiasis 07/01/2012  . Cholelithiasis 08/02/2010  . Disturbance of skin sensation 11/25/2009  . Diverticulitis of colon (without mention of hemorrhage)(562.11) 10/28/2003  . Dizziness and giddiness 07/13/2006  . Edema 07/19/2006  . Lumbago 12/22/2009  . Memory loss 02/17/2010  . Osteoarthrosis, unspecified  whether generalized or localized, unspecified site 07/19/2006  . Other and unspecified hyperlipidemia 03/28/2007  . Pain in joint, upper arm 10/25/2006  . Personal history of fall 12/22/2009  . Unspecified constipation 09/26/2007  . Unspecified essential hypertension 07/19/2006  . Unspecified vitamin D deficiency 08/19/2009  . Uterine prolapse without mention of vaginal wall prolapse 06/29/2011   Past Surgical History:  Procedure Laterality Date  . CATARACT EXTRACTION W/ INTRAOCULAR LENS  IMPLANT, BILATERAL Bilateral 1999    Allergies  Allergen Reactions  . Lopid [Gemfibrozil]   . Sulfa Antibiotics   . Zocor [Simvastatin]     Outpatient Encounter Prescriptions as of 09/22/2016  Medication Sig  . acetaminophen (TYLENOL) 325 MG tablet Take 325 mg by mouth every 6 (six) hours as needed for mild pain. Take two tablets every 4 hours as needed for pain  . amoxicillin-clavulanate (AUGMENTIN) 875-125 MG tablet Take 1 tablet by mouth 2 (two) times daily.  . calcium carbonate (TUMS) 500 MG chewable tablet Chew 3 tablets by mouth daily. Take three tablets once daily  . Cholecalciferol (VITAMIN D) 1000 UNITS capsule Take 1,000 Units by mouth. Take 2 daily  . donepezil (ARICEPT) 10 MG tablet One daily to help preserve memory.  . furosemide (LASIX) 20 MG tablet Take 20 mg by mouth daily.  Marland Kitchen. loratadine (CLARITIN) 10 MG tablet Take 10 mg by mouth daily.  Marland Kitchen. nystatin (NYSTATIN) powder Apply topically 2 (two) times daily.  Marland Kitchen. saccharomyces boulardii (FLORASTOR) 250 MG capsule Take 250 mg by mouth 2 (two) times daily.  . [DISCONTINUED] magnesium hydroxide (  MILK OF MAGNESIA) 400 MG/5ML suspension Take by mouth. Take 30 ml daily once daily as needed for constipation   No facility-administered encounter medications on file as of 09/22/2016.     Review of Systems  Immunization History  Administered Date(s) Administered  . Influenza Whole 01/16/2012, 02/20/2013  . Influenza-Unspecified 02/19/2014, 01/05/2015,  02/01/2016  . PPD Test 08/23/2010, 08/04/2015  . Pneumococcal Polysaccharide-23 06/24/1998  . Td 05/02/1974   Pertinent  Health Maintenance Due  Topic Date Due  . DEXA SCAN  03/19/1986  . PNA vac Low Risk Adult (2 of 2 - PCV13) 06/24/1999  . INFLUENZA VACCINE  11/15/2016   Fall Risk  08/06/2015 08/06/2015 11/19/2014 11/19/2014 05/21/2014  Falls in the past year? No No Yes No Yes  Number falls in past yr: - - 1 - -  Injury with Fall? - - No - -   Functional Status Survey:    Vitals:   09/22/16 1625  BP: 118/70  Pulse: 74  Resp: 16  Temp: 98.6 F (37 C)  Weight: 142 lb 6.4 oz (64.6 kg)  Height: 5\' 3"  (1.6 m)   Body mass index is 25.23 kg/m. Physical Exam  Labs reviewed:  Recent Labs  11/10/15 03/14/16  NA 140  140 139  K 3.9 4.1  BUN 22* 19  CREATININE 1.4* 1.3*    Recent Labs  03/14/16  AST 22  ALT 17  ALKPHOS 88    Recent Labs  03/14/16  WBC 7.1  HGB 14.2  HCT 42  PLT 257   Lab Results  Component Value Date   TSH 2.72 08/04/2015   Lab Results  Component Value Date   HGBA1C 5.7 03/10/2014   Lab Results  Component Value Date   CHOL 297 (A) 04/01/2013   HDL 54 04/01/2013   LDLCALC 200 04/01/2013   TRIG 214 (A) 04/01/2013    Significant Diagnostic Results in last 30 days:  No results found.  Assessment/Plan 1. Altered mental status, unspecified altered mental status type  2. Fever, unspecified fever cause   3. 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 Essential hypertension   5. Edema, unspecified type     Family/ staff Communication:   Labs/tests ordered:

## 2016-09-22 NOTE — Assessment & Plan Note (Addendum)
grade T, Tylenol is effective, but the patient has appears weakness in general, didn't recognize her son and DIL, responded slower than her usual, but no focal neurological symptoms, no O2 desaturation, denied pain.  Obtain CBC CMP UA C/S, CXR observe the patient.

## 2016-09-22 NOTE — Assessment & Plan Note (Addendum)
Continue prn Tylenol, obtain CBC CMP UA C/S CXR, empirical ABT Augmentin 875mg  q12h x 7 days, may stop if urine culture is negative for UTI. Observe the patient.

## 2016-09-25 ENCOUNTER — Encounter: Payer: Self-pay | Admitting: Nurse Practitioner

## 2016-09-25 ENCOUNTER — Non-Acute Institutional Stay (SKILLED_NURSING_FACILITY): Payer: Medicare Other | Admitting: Nurse Practitioner

## 2016-09-25 DIAGNOSIS — I10 Essential (primary) hypertension: Secondary | ICD-10-CM

## 2016-09-25 DIAGNOSIS — D72829 Elevated white blood cell count, unspecified: Secondary | ICD-10-CM

## 2016-09-25 DIAGNOSIS — R609 Edema, unspecified: Secondary | ICD-10-CM | POA: Diagnosis not present

## 2016-09-25 DIAGNOSIS — N179 Acute kidney failure, unspecified: Secondary | ICD-10-CM

## 2016-09-25 DIAGNOSIS — R509 Fever, unspecified: Secondary | ICD-10-CM | POA: Diagnosis not present

## 2016-09-25 DIAGNOSIS — R4182 Altered mental status, unspecified: Secondary | ICD-10-CM | POA: Diagnosis not present

## 2016-09-25 NOTE — Assessment & Plan Note (Signed)
D51/2 NS at 75cc/hr x , BMP upon completion of IVF

## 2016-09-25 NOTE — Assessment & Plan Note (Signed)
Not apparent, dc Furosemide.

## 2016-09-25 NOTE — Assessment & Plan Note (Signed)
Controlled, continue to be off Furosemide.

## 2016-09-25 NOTE — Assessment & Plan Note (Signed)
Resolved

## 2016-09-25 NOTE — Assessment & Plan Note (Signed)
Not delirium, but not at her baseline yet.

## 2016-09-25 NOTE — Assessment & Plan Note (Signed)
Complete total 7 day course of Augmentin 875mg  bid, POA declined Cath UA C/S, unable to obtain clean catch due to her urinary incontinence.

## 2016-09-25 NOTE — Progress Notes (Signed)
Location:  [facility] Room Number: 111 Place of Service:  SNF (31) Provider: Wilene Pharo, Manxie  NP  [name], MD  Patient Care Team: [name], MD as PCP - General (Internal Medicine) [place] Lenon Curt, MD as Consulting Physician (Internal Medicine) Reshard Gu[place] X, NP as Nurse Practitioner (Internal Medicine)  Extended Emergency Contact Information Primary Emergency Contact: [facility] Address: [address]          [place], [place] [address] [place] Home Phone: [phone] Relation: Son Secondary Emergency Contact: [name],[name] Address: [address]          [place], [place] [address] [place] Home Phone: [phone] Relation: Daughter  Code Status:  DNR Goals of care: Advanced Directive information Advanced Directives 09/25/2016  Does Patient Have a Medical Advance Directive? Yes  Type of Estate agent of [place];Living will;Out of facility DNR (pink MOST or yellow form)  Does patient want to make changes to medical advance directive? No - Patient declined  Copy of Healthcare Power of Attorney in Chart? Yes  Pre-existing out of facility DNR order (yellow form or pink MOST form) Yellow form placed in chart (order not valid for inpatient use)     Chief Complaint  Patient presents with  . Acute Visit    HPI:  Pt is a 83 y.o. female seen today for an acute visit for elevated wbc 12.7 09/22/16, 7 day course of Augmentin 875mg  bid started, tolerated. Bun/creat 39/1.79 6//8/18, Furosemide on hold, oral intake has been limited, she appears weak, but still alert.    Hx of dementia, taking Aricept, resides [facility] Care unit [facility], chronic multiple sites arthritis, ambulates with walker on unit, chronic edema, hold Furosemide 20mg    Past Medical History:  Diagnosis Date  . Abnormal mammogram, unspecified 10/25/2006  . Abnormality of gait 03/02/2011  . Allergic rhinitis due to  pollen 07/13/2006  . Blood in stool 06/29/2011  . Candidiasis 07/01/2012  . Cholelithiasis 08/02/2010  . Disturbance of skin sensation 11/25/2009  . Diverticulitis of colon (without mention of hemorrhage)(562.11) 10/28/2003  . Dizziness and giddiness 07/13/2006  . Edema 07/19/2006  . Lumbago 12/22/2009  . Memory loss 02/17/2010  . Osteoarthrosis, unspecified whether generalized or localized, unspecified site 07/19/2006  . Other and unspecified hyperlipidemia 03/28/2007  . Pain in joint, upper arm 10/25/2006  . Personal history of fall 12/22/2009  . Unspecified constipation 09/26/2007  . Unspecified essential hypertension 07/19/2006  . Unspecified vitamin D deficiency 08/19/2009  . Uterine prolapse without mention of vaginal wall prolapse 06/29/2011   Past Surgical History:  Procedure Laterality Date  . CATARACT EXTRACTION W/ INTRAOCULAR LENS  IMPLANT, BILATERAL Bilateral 1999    Allergies  Allergen Reactions  . Lopid [Gemfibrozil]   . Sulfa Antibiotics   . Zocor [Simvastatin]     Outpatient Encounter Prescriptions as of 09/25/2016  Medication Sig  . acetaminophen (TYLENOL) 325 MG tablet Take 325 mg by mouth every 6 (six) hours as needed for mild pain. Take two tablets every 4 hours as needed for pain  . amoxicillin-clavulanate (AUGMENTIN) 875-125 MG tablet Take 1 tablet by mouth 2 (two) times daily.  . calcium carbonate (TUMS) 500 MG chewable tablet Chew 3 tablets by mouth daily. Take three tablets once daily  . Cholecalciferol (VITAMIN D) 1000 UNITS capsule Take 1,000 Units by mouth. Take 2 daily  . donepezil (ARICEPT) 10 MG tablet One daily to help preserve memory.  Marland Kitchen loratadine (  CLARITIN) 10 MG tablet Take 10 mg by mouth daily.  Marland Kitchen. nystatin (NYSTATIN) powder Apply topically 2 (two) times daily.  Marland Kitchen. saccharomyces boulardii (FLORASTOR) 250 MG capsule Take 250 mg by mouth 2 (two) times daily.  . [DISCONTINUED] furosemide (LASIX) 20 MG tablet Take 20 mg by mouth daily.   No facility-administered  encounter medications on file as of 09/25/2016.     Review of Systems  Constitutional: Positive for appetite change and fatigue. Negative for activity change, chills, diaphoresis and fever.  HENT: Positive for hearing loss. Negative for congestion, ear pain, facial swelling, nosebleeds, postnasal drip, sneezing and tinnitus.   Eyes: Negative for photophobia, pain, discharge, itching and visual disturbance.  Respiratory: Negative for cough, choking, chest tightness, shortness of breath and stridor.   Cardiovascular: Negative for chest pain, palpitations and leg swelling.  Gastrointestinal: Positive for constipation. Negative for abdominal distention, abdominal pain, diarrhea and nausea.  Endocrine: Negative.   Genitourinary:       Incontinent  Musculoskeletal: Positive for arthralgias and gait problem. Negative for back pain, joint swelling, myalgias, neck pain and neck stiffness.       Hx fx right 5th MC  Skin: Negative for color change (Erythema of the distal legs).       Urogenital area redness.   Allergic/Immunologic: Negative.   Neurological: Negative for dizziness, tremors, seizures, syncope, facial asymmetry, speech difficulty, light-headedness, numbness and headaches.  Hematological: Negative.   Psychiatric/Behavioral: Positive for confusion. Negative for agitation, behavioral problems, decreased concentration, dysphoric mood, hallucinations, self-injury and sleep disturbance. The patient is nervous/anxious. The patient is not hyperactive.     Immunization History  Administered Date(s) Administered  . Influenza Whole 01/16/2012, 02/20/2013  . Influenza-Unspecified 02/19/2014, 01/05/2015, 02/01/2016  . PPD Test 08/23/2010, 08/04/2015  . Pneumococcal Polysaccharide-23 06/24/1998  . Td 05/02/1974   Pertinent  Health Maintenance Due  Topic Date Due  . DEXA SCAN  03/19/1986  . PNA vac Low Risk Adult (2 of 2 - PCV13) 06/24/1999  . INFLUENZA VACCINE  11/15/2016   Fall Risk   08/06/2015 08/06/2015 11/19/2014 11/19/2014 05/21/2014  Falls in the past year? No No Yes No Yes  Number falls in past yr: - - 1 - -  Injury with Fall? - - No - -   Functional Status Survey:    Vitals:   09/25/16 1409  BP: 118/70  Pulse: 74  Resp: 16  Temp: 98.6 F (37 C)  Weight: 142 lb 6.4 oz (64.6 kg)  Height: 5\' 3"  (1.6 m)   Body mass index is 25.23 kg/m. Physical Exam  Constitutional:  overweight  HENT:  Mild loss of hearing  Eyes:  Corrective lenses.  Neck: No JVD present. No thyromegaly present.  Cardiovascular: Normal rate, regular rhythm, normal heart sounds and intact distal pulses.  Exam reveals no gallop and no friction rub.   No murmur heard. Pulmonary/Chest: Effort normal and breath sounds normal. No respiratory distress. She has no wheezes. She has no rales. She exhibits no tenderness.  Abdominal: Bowel sounds are normal. She exhibits no distension and no mass. There is no tenderness.  Musculoskeletal: Normal range of motion. She exhibits no edema or tenderness.  Unstable gait; using 4 wheel walker.   Lymphadenopathy:    She has no cervical adenopathy.  Neurological: She is alert. No cranial nerve deficit. Coordination normal.  10/23/13 MMSE 17/30. Failed clock drawing.  Skin: No rash noted. No erythema (diatal legs bilaterally). No pallor.  Urogenital area redness.    Psychiatric: She has  a normal mood and affect.    Labs reviewed:  Recent Labs  11/10/15 03/14/16 09/22/16  NA 140  140 139 143  K 3.9 4.1 4.3  BUN 22* 19 39*  CREATININE 1.4* 1.3* 1.8*    Recent Labs  03/14/16 09/22/16  AST 22 15  ALT 17 11  ALKPHOS 88 105    Recent Labs  03/14/16 09/22/16  WBC 7.1 12.7  HGB 14.2 13.9  HCT 42 41  PLT 257 228   Lab Results  Component Value Date   TSH 2.72 08/04/2015   Lab Results  Component Value Date   HGBA1C 5.7 03/10/2014   Lab Results  Component Value Date   CHOL 297 (A) 04/01/2013   HDL 54 04/01/2013   LDLCALC 200 04/01/2013    TRIG 214 (A) 04/01/2013    Significant Diagnostic Results in last 30 days:  No results found.  Assessment/Plan AKI (acute kidney injury) (HCC) D51/2 NS at 75cc/hr x , BMP upon completion of IVF  Leukocytosis Complete total 7 day course of Augmentin 875mg  bid, POA declined Cath UA C/S, unable to obtain clean catch due to her urinary incontinence.   Essential hypertension Controlled, continue to be off Furosemide.   Edema Not apparent, dc Furosemide.   Altered mental status Not delirium, but not at her baseline yet.   Fever Resolved.     Family/ staff Communication: SNF  Labs/tests ordered:  BMP

## 2016-09-25 NOTE — Progress Notes (Signed)
[facility]  [facility] Room Number: 111 Place of Service:  SNF [phone]) Provider:  [place]  NP  Kimber RelicGreen, Arthur G, MD  Patient Care Team: Kimber RelicGreen, Arthur G, MD as PCP - General (Internal Medicine) [facility] Kimber RelicGreen, Arthur G, MD as Consulting Physician (Internal Medicine) Mast, Man X, NP as Nurse Practitioner (Internal Medicine)  Extended Emergency Contact Information Primary Emergency Contact: [facility] Address: [address]          [place], [place] [address] [place] Home Phone: [phone] Relation: Son Secondary Emergency Contact: [name],[name] Address: [address]          [place], [place] [address] [place] Home Phone: [phone] Relation: Daughter  Code Status:  DNR Goals of care: Advanced Directive information Advanced Directives 09/25/2016  Does Patient Have a Medical Advance Directive? Yes  Type of Estate agentAdvance Directive Healthcare Power of StrykerAttorney;Living will;Out of facility DNR (pink MOST or yellow form)  Does patient want to make changes to medical advance directive? No - Patient declined  Copy of Healthcare Power of Attorney in Chart? Yes  Pre-existing out of facility DNR order (yellow form or pink MOST form) Yellow form placed in chart (order not valid for inpatient use)     Chief Complaint  Patient presents with  . Acute Visit    HPI:  Pt is a 83 y.o. female seen today for an acute visit for    Past Medical History:  Diagnosis Date  . Abnormal mammogram, unspecified 10/25/2006  . Abnormality of gait 03/02/2011  . Allergic rhinitis due to pollen 07/13/2006  . Blood in stool 06/29/2011  . Candidiasis 07/01/2012  . Cholelithiasis 08/02/2010  . Disturbance of skin sensation 11/25/2009  . Diverticulitis of colon (without mention of hemorrhage)(562.11) 10/28/2003  . Dizziness and giddiness 07/13/2006  . Edema 07/19/2006  . Lumbago 12/22/2009  . Memory loss 02/17/2010  . Osteoarthrosis, unspecified whether  generalized or localized, unspecified site 07/19/2006  . Other and unspecified hyperlipidemia 03/28/2007  . Pain in joint, upper arm 10/25/2006  . Personal history of fall 12/22/2009  . Unspecified constipation 09/26/2007  . Unspecified essential hypertension 07/19/2006  . Unspecified vitamin D deficiency 08/19/2009  . Uterine prolapse without mention of vaginal wall prolapse 06/29/2011   Past Surgical History:  Procedure Laterality Date  . CATARACT EXTRACTION W/ INTRAOCULAR LENS  IMPLANT, BILATERAL Bilateral 1999    Allergies  Allergen Reactions  . Lopid [Gemfibrozil]   . Sulfa Antibiotics   . Zocor [Simvastatin]     Outpatient Encounter Prescriptions as of 09/25/2016  Medication Sig  . acetaminophen (TYLENOL) 325 MG tablet Take 325 mg by mouth every 6 (six) hours as needed for mild pain. Take two tablets every 4 hours as needed for pain  . amoxicillin-clavulanate (AUGMENTIN) 875-125 MG tablet Take 1 tablet by mouth 2 (two) times daily.  . calcium carbonate (TUMS) 500 MG chewable tablet Chew 3 tablets by mouth daily. Take three tablets once daily  . Cholecalciferol (VITAMIN D) 1000 UNITS capsule Take 1,000 Units by mouth. Take 2 daily  . donepezil (ARICEPT) 10 MG tablet One daily to help preserve memory.  Marland Kitchen. loratadine (CLARITIN) 10 MG tablet Take 10 mg by mouth daily.  Marland Kitchen. nystatin (NYSTATIN) powder Apply topically 2 (two) times daily.  Marland Kitchen. saccharomyces boulardii (FLORASTOR) 250 MG capsule Take 250 mg by mouth 2 (two) times daily.  . [DISCONTINUED] furosemide (LASIX) 20 MG tablet Take 20 mg by mouth daily.   No facility-administered  encounter medications on file as of 09/25/2016.     Review of Systems  Immunization History  Administered Date(s) Administered  . Influenza Whole 01/16/2012, 02/20/2013  . Influenza-Unspecified 02/19/2014, 01/05/2015, 02/01/2016  . PPD Test 08/23/2010, 08/04/2015  . Pneumococcal Polysaccharide-23 06/24/1998  . Td 05/02/1974   Pertinent  Health Maintenance  Due  Topic Date Due  . DEXA SCAN  03/19/1986  . PNA vac Low Risk Adult (2 of 2 - PCV13) 06/24/1999  . INFLUENZA VACCINE  11/15/2016   Fall Risk  08/06/2015 08/06/2015 11/19/2014 11/19/2014 05/21/2014  Falls in the past year? No No Yes No Yes  Number falls in past yr: - - 1 - -  Injury with Fall? - - No - -   Functional Status Survey:    Vitals:   09/25/16 1409  BP: 118/70  Pulse: 74  Resp: 16  Temp: 98.6 F (37 C)  Weight: 142 lb 6.4 oz (64.6 kg)  Height: 5\' 3"  (1.6 m)   Body mass index is 25.23 kg/m. Physical Exam  Labs reviewed:  Recent Labs  11/10/15 03/14/16 09/22/16  NA 140  140 139 143  K 3.9 4.1 4.3  BUN 22* 19 39*  CREATININE 1.4* 1.3* 1.8*    Recent Labs  03/14/16 09/22/16  AST 22 15  ALT 17 11  ALKPHOS 88 105    Recent Labs  03/14/16 09/22/16  WBC 7.1 12.7  HGB 14.2 13.9  HCT 42 41  PLT 257 228   Lab Results  Component Value Date   TSH 2.72 08/04/2015   Lab Results  Component Value Date   HGBA1C 5.7 03/10/2014   Lab Results  Component Value Date   CHOL 297 (A) 04/01/2013   HDL 54 04/01/2013   LDLCALC 200 04/01/2013   TRIG 214 (A) 04/01/2013    Significant Diagnostic Results in last 30 days:  No results found.  Assessment/Plan There are no diagnoses linked to this encounter.   Family/ staff Communication:   Labs/tests ordered:

## 2016-09-28 LAB — BASIC METABOLIC PANEL
BUN: 31 — AB (ref 4–21)
Creatinine: 1.2 — AB (ref 0.5–1.1)
Glucose: 101
Potassium: 4 (ref 3.4–5.3)
Sodium: 148 — AB (ref 137–147)

## 2016-10-03 ENCOUNTER — Encounter: Payer: Self-pay | Admitting: Internal Medicine

## 2016-10-03 ENCOUNTER — Non-Acute Institutional Stay (SKILLED_NURSING_FACILITY): Payer: Medicare Other | Admitting: Internal Medicine

## 2016-10-03 DIAGNOSIS — N3000 Acute cystitis without hematuria: Secondary | ICD-10-CM

## 2016-10-03 DIAGNOSIS — N183 Chronic kidney disease, stage 3 unspecified: Secondary | ICD-10-CM

## 2016-10-03 DIAGNOSIS — R600 Localized edema: Secondary | ICD-10-CM

## 2016-10-03 NOTE — Progress Notes (Addendum)
Location[facility] Room Number: 111 Place of Service:  SNF [phone]) Provider:  Thos Matsumoto Crissie Figures, Belenda Alviar MD  Patient Care Team: Oneal Grout, MD as PCP - General (Internal Medicine) [facility] [name], MD as Consulting Physician (Internal Medicine) Mast, Man X, NP as Nurse Practitioner (Internal Medicine)  Extended Emergency Contact Information Primary Emergency Contact: [facility] Address: [address]          [place], [place] [address] [place] Home Phone: [phone] Relation: Son Secondary Emergency Contact: [name],[name] Address: [address]          [place], [place] [address] [place] Home Phone: [phone] Relation: Daughter  Code Status:  DNR Goals of care: Advanced Directive information Advanced Directives 09/25/2016  Does Patient Have a Medical Advance Directive? Yes  Type of Estate agent of [place];Living will;Out of facility DNR (pink MOST or yellow form)  Does patient want to make changes to medical advance directive? No - Patient declined  Copy of Healthcare Power of Attorney in Chart? Yes  Pre-existing out of facility DNR order (yellow form or pink MOST form) Yellow form placed in chart (order not valid for inpatient use)     Chief Complaint  Patient presents with  . Acute Visit    Left leg pain    HPI:  Pt is a 83 y.o. female seen today for an acute visit for left foot swelling. Nursing reports her left foot being swollen for few days. Of note, she was on lasix before that was discontinued due to dehydration. She was recently diagnosed with UTI and treated with antibiotic for it. following the treatment, she has made clinical improvement and her appetite has improved. Her po intake has improved and she is more up and about. No fall reported. She has dementia and is pleasantly confused.    Past Medical History:  Diagnosis Date  . Abnormal mammogram,  unspecified 10/25/2006  . Abnormality of gait 03/02/2011  . Allergic rhinitis due to pollen 07/13/2006  . Blood in stool 06/29/2011  . Candidiasis 07/01/2012  . Cholelithiasis 08/02/2010  . Disturbance of skin sensation 11/25/2009  . Diverticulitis of colon (without mention of hemorrhage)(562.11) 10/28/2003  . Dizziness and giddiness 07/13/2006  . Edema 07/19/2006  . Lumbago 12/22/2009  . Memory loss 02/17/2010  . Osteoarthrosis, unspecified whether generalized or localized, unspecified site 07/19/2006  . Other and unspecified hyperlipidemia 03/28/2007  . Pain in joint, upper arm 10/25/2006  . Personal history of fall 12/22/2009  . Unspecified constipation 09/26/2007  . Unspecified essential hypertension 07/19/2006  . Unspecified vitamin D deficiency 08/19/2009  . Uterine prolapse without mention of vaginal wall prolapse 06/29/2011   Past Surgical History:  Procedure Laterality Date  . CATARACT EXTRACTION W/ INTRAOCULAR LENS  IMPLANT, BILATERAL Bilateral 1999    Allergies  Allergen Reactions  . Lopid [Gemfibrozil]   . Sulfa Antibiotics   . Zocor [Simvastatin]     Outpatient Encounter Prescriptions as of 10/03/2016  Medication Sig  . acetaminophen (TYLENOL) 325 MG tablet Take 650 mg by mouth every 6 (six) hours as needed for mild pain.   . calcium carbonate (TUMS) 500 MG chewable tablet Chew 3 tablets by mouth daily. Take three tablets once daily  . Cholecalciferol (VITAMIN D) 1000 UNITS capsule Take 2,000 Units by mouth daily.   Marland Kitchen donepezil (ARICEPT) 10 MG tablet One daily to help preserve memory.  Marland Kitchen loratadine (CLARITIN) 10 MG tablet Take 10  mg by mouth daily.  . magnesium hydroxide (MILK OF MAGNESIA) 400 MG/5ML suspension Take 30 mLs by mouth daily as needed for mild constipation.  Marland Kitchen nystatin (NYSTATIN) powder Apply topically 2 (two) times daily.  . [DISCONTINUED] amoxicillin-clavulanate (AUGMENTIN) 875-125 MG tablet Take 1 tablet by mouth 2 (two) times daily.  . [DISCONTINUED] saccharomyces  boulardii (FLORASTOR) 250 MG capsule Take 250 mg by mouth 2 (two) times daily.   No facility-administered encounter medications on file as of 10/03/2016.     Review of Systems  Constitutional: Negative for chills and fever.  HENT: Negative for rhinorrhea.   Eyes: Negative for visual disturbance.  Respiratory: Negative for cough, chest tightness, shortness of breath and wheezing.   Cardiovascular: Negative for chest pain and palpitations.  Gastrointestinal: Negative for abdominal pain, diarrhea, nausea and vomiting.  Genitourinary: Negative for flank pain and frequency.  Musculoskeletal: Negative for back pain.  Skin: Negative for rash.  Neurological: Negative for dizziness and headaches.  Psychiatric/Behavioral: Negative for behavioral problems.    Immunization History  Administered Date(s) Administered  . Influenza Whole 01/16/2012, 02/20/2013  . Influenza-Unspecified 02/19/2014, 01/05/2015, 02/01/2016  . PPD Test 08/23/2010, 08/04/2015  . Pneumococcal Polysaccharide-23 06/24/1998  . Td 05/02/1974   Pertinent  Health Maintenance Due  Topic Date Due  . DEXA SCAN  03/19/1986  . PNA vac Low Risk Adult (2 of 2 - PCV13) 06/24/1999  . INFLUENZA VACCINE  11/15/2016   Fall Risk  08/06/2015 08/06/2015 11/19/2014 11/19/2014 05/21/2014  Falls in the past year? No No Yes No Yes  Number falls in past yr: - - 1 - -  Injury with Fall? - - No - -   Functional Status Survey:    Vitals:   10/03/16 1129  BP: 120/80  Pulse: 66  Resp: 16  Temp: 98.6 F (37 C)  TempSrc: Oral  Weight: 142 lb 6.4 oz (64.6 kg)  Height: 5\' 3"  (1.6 m)   Body mass index is 25.23 kg/m. Physical Exam  Constitutional: She appears well-developed and well-nourished. No distress.  HENT:  Head: Normocephalic and atraumatic.  Eyes: Conjunctivae are normal. Pupils are equal, round, and reactive to light.  Neck: Normal range of motion. Neck supple.  Cardiovascular: Normal rate and regular rhythm.   Pulmonary/Chest:  Effort normal. No respiratory distress. She has no wheezes. She has no rales. She exhibits no tenderness.  Abdominal: Soft. Bowel sounds are normal.  Musculoskeletal:  On wheelchair. Trace left foot edema, no ankle edema, no leg edema  Lymphadenopathy:    She has no cervical adenopathy.  Neurological: She is alert.  Oriented to person  Skin: Skin is warm and dry. She is not diaphoretic.  Psychiatric: She has a normal mood and affect.  Pleasantly confused    Labs reviewed:  Recent Labs  03/14/16 09/22/16 09/28/16  NA 139 143 148*  K 4.1 4.3 4.0  BUN 19 39* 31*  CREATININE 1.3* 1.8* 1.2*    Recent Labs  03/14/16 09/22/16  AST 22 15  ALT 17 11  ALKPHOS 88 105    Recent Labs  03/14/16 09/22/16  WBC 7.1 12.7  HGB 14.2 13.9  HCT 42 41  PLT 257 228   Lab Results  Component Value Date   TSH 2.72 08/04/2015   Lab Results  Component Value Date   HGBA1C 5.7 03/10/2014   Lab Results  Component Value Date   CHOL 297 (A) 04/01/2013   HDL 54 04/01/2013   LDLCALC 200 04/01/2013   TRIG 214 (A)  04/01/2013    Significant Diagnostic Results in last 30 days:  No results found.  Assessment/Plan  Foot edema Likely from venous stasis. No signs of fluid overload on cardiac and pulmonary exam. Advised nursing to keep legs elevated while resting for now. Consider ted hose if worsens. Diuretic currently not indicated.   ckd stage 3 Monitor renal function, maintain hydration  UTI Completed antibiotic, denies any symptom at present, monitor clinically, maintain hydration   Family/ staff Communication: reviewed care plan with patient and charge nurse.    Labs/tests ordered: bmp 4 weeks  Oneal GroutMAHIMA Dayvon Dax, MD Internal Medicine [facility] [address] [place], [place] [address] Cell Phone (Monday-Friday 8 am - 5 pm): [phone] On Call: [phone] and follow prompts after 5 pm and on weekends Office Phone: [phone] Office Fax:  [phone]

## 2016-10-13 ENCOUNTER — Encounter: Payer: Self-pay | Admitting: Internal Medicine

## 2016-10-13 ENCOUNTER — Non-Acute Institutional Stay (SKILLED_NURSING_FACILITY): Payer: Medicare Other | Admitting: Internal Medicine

## 2016-10-13 DIAGNOSIS — D72829 Elevated white blood cell count, unspecified: Secondary | ICD-10-CM | POA: Diagnosis not present

## 2016-10-13 DIAGNOSIS — M150 Primary generalized (osteo)arthritis: Secondary | ICD-10-CM | POA: Diagnosis not present

## 2016-10-13 DIAGNOSIS — E559 Vitamin D deficiency, unspecified: Secondary | ICD-10-CM | POA: Diagnosis not present

## 2016-10-13 DIAGNOSIS — J309 Allergic rhinitis, unspecified: Secondary | ICD-10-CM

## 2016-10-13 DIAGNOSIS — N183 Chronic kidney disease, stage 3 unspecified: Secondary | ICD-10-CM

## 2016-10-13 DIAGNOSIS — F0391 Unspecified dementia with behavioral disturbance: Secondary | ICD-10-CM

## 2016-10-13 DIAGNOSIS — M159 Polyosteoarthritis, unspecified: Secondary | ICD-10-CM

## 2016-10-13 NOTE — Progress Notes (Signed)
[facility]  [facility] Room Number: 111 Place of Service:  SNF [phone]) Provider:  Oneal Grout MD  Oneal Grout, MD  Patient Care Team: Oneal Grout, MD as PCP - General (Internal Medicine) [facility] [name], MD as Consulting Physician (Internal Medicine) Mast, Man X, NP as Nurse Practitioner (Internal Medicine)  Extended Emergency Contact Information Primary Emergency Contact: [facility] Address: [address]          [place], [place] [address] [place] Home Phone: [phone] Relation: Son Secondary Emergency Contact: [name],[name] Address: [address]          [place], [place] [address] [place] Home Phone: [phone] Relation: Daughter  Code Status:  DNR  Goals of care: Advanced Directive information Advanced Directives 09/25/2016  Does Patient Have a Medical Advance Directive? Yes  Type of Estate agent of [place];Living will;Out of facility DNR (pink MOST or yellow form)  Does patient want to make changes to medical advance directive? No - Patient declined  Copy of Healthcare Power of Attorney in Chart? Yes  Pre-existing out of facility DNR order (yellow form or pink MOST form) Yellow form placed in chart (order not valid for inpatient use)     Chief Complaint  Patient presents with  . Medical Management of Chronic Issues    Routine Visit     HPI:  Patient is a 83 y.o. female seen today for medical management of chronic diseases.  She resides in memory care at [facility]. She is pleasantly confused and has been at her baseline. She was treated for presumed UTI beginning of this month with decreased urine output and AMS. Her mental state improved. She received iv fluids and her renal function also improved. She is eating good now. No fall reported. She does not participate in HPI/ROS with advanced dementia. No acute behavior issue. No pressure ulcer or skin tear reported.  She is incontinent with bowel and bladder.    Past Medical History:  Diagnosis Date  . Abnormal mammogram, unspecified 10/25/2006  . Abnormality of gait 03/02/2011  . Allergic rhinitis due to pollen 07/13/2006  . Blood in stool 06/29/2011  . Candidiasis 07/01/2012  . Cholelithiasis 08/02/2010  . Disturbance of skin sensation 11/25/2009  . Diverticulitis of colon (without mention of hemorrhage)(562.11) 10/28/2003  . Dizziness and giddiness 07/13/2006  . Edema 07/19/2006  . Lumbago 12/22/2009  . Memory loss 02/17/2010  . Osteoarthrosis, unspecified whether generalized or localized, unspecified site 07/19/2006  . Other and unspecified hyperlipidemia 03/28/2007  . Pain in joint, upper arm 10/25/2006  . Personal history of fall 12/22/2009  . Unspecified constipation 09/26/2007  . Unspecified essential hypertension 07/19/2006  . Unspecified vitamin D deficiency 08/19/2009  . Uterine prolapse without mention of vaginal wall prolapse 06/29/2011   Past Surgical History:  Procedure Laterality Date  . CATARACT EXTRACTION W/ INTRAOCULAR LENS  IMPLANT, BILATERAL Bilateral 1999    Allergies  Allergen Reactions  . Lopid [Gemfibrozil]   . Sulfa Antibiotics   . Zocor [Simvastatin]     Outpatient Encounter Prescriptions as of 10/13/2016  Medication Sig  . acetaminophen (TYLENOL) 325 MG tablet Take 650 mg by mouth every 6 (six) hours as needed for mild pain.   . calcium carbonate (TUMS) 500 MG chewable tablet Chew 3 tablets by mouth daily. Take three tablets once daily  . Cholecalciferol (VITAMIN D) 1000 UNITS capsule Take 2,000 Units by mouth daily.   Marland Kitchen donepezil (ARICEPT) 10 MG  tablet One daily to help preserve memory.  Marland Kitchen. loratadine (CLARITIN) 10 MG tablet Take 10 mg by mouth daily.  . magnesium hydroxide (MILK OF MAGNESIA) 400 MG/5ML suspension Take 30 mLs by mouth as needed for mild constipation.   . [DISCONTINUED] nystatin (NYSTATIN) powder Apply topically 2 (two) times daily.   No facility-administered  encounter medications on file as of 10/13/2016.     Review of Systems  Unable to perform ROS: Dementia    Immunization History  Administered Date(s) Administered  . Influenza Whole 01/16/2012, 02/20/2013  . Influenza-Unspecified 02/19/2014, 01/05/2015, 02/01/2016  . PPD Test 08/23/2010, 08/04/2015  . Pneumococcal Polysaccharide-23 06/24/1998  . Td 05/02/1974   Pertinent  Health Maintenance Due  Topic Date Due  . PNA vac Low Risk Adult (2 of 2 - PCV13) 04/17/2017 (Originally 06/24/1999)  . DEXA SCAN  10/13/2017 (Originally 03/19/1986)  . INFLUENZA VACCINE  11/15/2016   Fall Risk  08/06/2015 08/06/2015 11/19/2014 11/19/2014 05/21/2014  Falls in the past year? No No Yes No Yes  Number falls in past yr: - - 1 - -  Injury with Fall? - - No - -   Functional Status Survey:    Vitals:   10/13/16 1253  BP: 120/70  Pulse: 64  Resp: 16  Temp: 98.6 F (37 C)  TempSrc: Oral  Weight: 138 lb 9.6 oz (62.9 kg)  Height: 5\' 3"  (1.6 m)   Body mass index is 24.55 kg/m. Physical Exam  Constitutional: She appears well-developed and well-nourished. No distress.  HENT:  Head: Normocephalic and atraumatic.  Mouth/Throat: Oropharynx is clear and moist.  Has dentures and corrective glasses  Eyes: Conjunctivae are normal. Pupils are equal, round, and reactive to light.  Neck: Normal range of motion. Neck supple.  Cardiovascular: Normal rate and regular rhythm.   Pulmonary/Chest: Effort normal and breath sounds normal.  Abdominal: Soft. Bowel sounds are normal.  Musculoskeletal:  Arthritis changes present, can move all 4 extremities, trace foot edema   Lymphadenopathy:    She has no cervical adenopathy.  Neurological:  Oriented only to self  Skin: Skin is warm and dry. She is not diaphoretic.  Psychiatric:  Pleasantly confused    Labs reviewed:  Recent Labs  03/14/16 09/22/16 09/28/16  NA 139 143 148*  K 4.1 4.3 4.0  BUN 19 39* 31*  CREATININE 1.3* 1.8* 1.2*    Recent Labs  03/14/16  09/22/16  AST 22 15  ALT 17 11  ALKPHOS 88 105    Recent Labs  03/14/16 09/22/16  WBC 7.1 12.7  HGB 14.2 13.9  HCT 42 41  PLT 257 228   Lab Results  Component Value Date   TSH 2.72 08/04/2015   Lab Results  Component Value Date   HGBA1C 5.7 03/10/2014   Lab Results  Component Value Date   CHOL 297 (A) 04/01/2013   HDL 54 04/01/2013   LDLCALC 200 04/01/2013   TRIG 214 (A) 04/01/2013    Significant Diagnostic Results in last 30 days:  No results found.  Assessment/Plan  Vitamin d def Continue vitamin d 1000 u daily and monitor  Allergic rhinitis None at this visit. Per nursing no runny nose noted. Change her claritin to daily as needed and monitor  OA Continue tylenol 650 mg q6h prn pain. Continue calcium-vit d supplement.   Dementia With occasional emotional outbursts and few behavior changes at times. Overall stable. Continue supportive care and assist with ADLs. Continue donepezil.   ckd stage 3 With recent ARF. Clinically  improved with good po intake. Monitor clinically. Check bmp to assess renal function  Leukocytosis Afebrile, completed antibiotic for uti, check cbc with diff.    Family/ staff Communication: reviewed care plan with patient and charge nurse.    Labs/tests ordered:  Bmp, cbc with diff   Oneal Grout, MD Internal Medicine [facility] [address] [place], [place] [address] Cell Phone (Monday-Friday 8 am - 5 pm): [phone] On Call: [phone] and follow prompts after 5 pm and on weekends Office Phone: [phone] Office Fax: [phone]

## 2016-10-17 ENCOUNTER — Other Ambulatory Visit: Payer: Self-pay | Admitting: *Deleted

## 2016-10-17 LAB — BASIC METABOLIC PANEL
BUN: 16 (ref 4–21)
Creatinine: 1.3 — AB (ref <=1.1)
Glucose: 90
Potassium: 4.2 (ref 3.4–5.3)
Sodium: 141 (ref 137–147)

## 2016-10-17 LAB — CBC AND DIFFERENTIAL
Hemoglobin: 12.8 (ref 12.0–16.0)
PLATELETS: 198 (ref 150–399)
WBC: 7.2

## 2016-10-31 ENCOUNTER — Other Ambulatory Visit: Payer: Self-pay | Admitting: *Deleted

## 2016-10-31 LAB — BASIC METABOLIC PANEL
BUN: 19 (ref 4–21)
CREATININE: 1.3 — AB (ref <=1.1)
Glucose: 86
POTASSIUM: 4.2 (ref 3.4–5.3)
SODIUM: 139 (ref 137–147)

## 2016-11-15 ENCOUNTER — Encounter: Payer: Self-pay | Admitting: Nurse Practitioner

## 2016-11-15 ENCOUNTER — Non-Acute Institutional Stay (SKILLED_NURSING_FACILITY): Payer: Medicare Other | Admitting: Nurse Practitioner

## 2016-11-15 DIAGNOSIS — F0281 Dementia in other diseases classified elsewhere with behavioral disturbance: Secondary | ICD-10-CM

## 2016-11-15 DIAGNOSIS — R609 Edema, unspecified: Secondary | ICD-10-CM | POA: Diagnosis not present

## 2016-11-15 DIAGNOSIS — F02818 Dementia in other diseases classified elsewhere, unspecified severity, with other behavioral disturbance: Secondary | ICD-10-CM

## 2016-11-15 DIAGNOSIS — I10 Essential (primary) hypertension: Secondary | ICD-10-CM

## 2016-11-15 NOTE — Progress Notes (Signed)
Location:  [facility] Room Number: 111 Place of Service:  SNF (31) Provider:  Treson Laura,Manxie  NP  Oneal GroutPandey, Mahima, MD  Patient Care Team: Oneal GroutPandey, Mahima, MD as PCP - General (Internal Medicine) [place] RelicGreen, Arthur G, MD as Consulting Physician (Internal Medicine) Korynn Kenedy X, NP as NuConservator, museum/galleryrse Practitioner (Internal Medicine)  Extended Emergency Contact Information Primary Emergency Contact: [facility] Address: [address]          [place], [place] [address] [place] Home Phone: [phone] Relation: Son Secondary Emergency Contact: [name],[name] Address: [address]          [place], [place] [address] [place] Home Phone: [phone] Relation: Daughter  Code Status: DNR Goals of care: Advanced Directive information Advanced Directives 11/15/2016  Does Patient Have a Medical Advance Directive? Yes  Type of Estate agentAdvance Directive Healthcare Power of PeruAttorney;Living will;Out of facility DNR (pink MOST or yellow form)  Does patient want to make changes to medical advance directive? No - Patient declined  Copy of Healthcare Power of Attorney in Chart? Yes  Pre-existing out of facility DNR order (yellow form or pink MOST form) Yellow form placed in chart (order not valid for inpatient use)     Chief Complaint  Patient presents with  . Medical Management of Chronic Issues    HPI:  Pt is a [age] y.o. female seen today for medical management of chronic diseases.       Hx of dementia, taking Aricept 10mg  to preserve memory, [facility], chronic multiple sites arthritis, ambulates with walker on unit, prn Tylenol available to her, taking Vit D, Ca, chronic trace edema in ankles has no change, off Furosemide 20mg .   Past Medical History:  Diagnosis Date  . Abnormal mammogram, unspecified 10/25/2006  . Abnormality of gait 03/02/2011  . Allergic rhinitis due to pollen 07/13/2006  . Blood in stool 06/29/2011   . Candidiasis 07/01/2012  . Cholelithiasis 08/02/2010  . Disturbance of skin sensation 11/25/2009  . Diverticulitis of colon (without mention of hemorrhage)(562.11) 10/28/2003  . Dizziness and giddiness 07/13/2006  . Edema 07/19/2006  . Lumbago 12/22/2009  . Memory loss 02/17/2010  . Osteoarthrosis, unspecified whether generalized or localized, unspecified site 07/19/2006  . Other and unspecified hyperlipidemia 03/28/2007  . Pain in joint, upper arm 10/25/2006  . Personal history of fall 12/22/2009  . Unspecified constipation 09/26/2007  . Unspecified essential hypertension 07/19/2006  . Unspecified vitamin D deficiency 08/19/2009  . Uterine prolapse without mention of vaginal wall prolapse 06/29/2011   Past Surgical History:  Procedure Laterality Date  . CATARACT EXTRACTION W/ INTRAOCULAR LENS  IMPLANT, BILATERAL Bilateral 1999    Allergies  Allergen Reactions  . Lopid [Gemfibrozil]   . Sulfa Antibiotics   . Zocor [Simvastatin]     Outpatient Encounter Prescriptions as of 11/15/2016  Medication Sig  . acetaminophen (TYLENOL) 325 MG tablet Take 650 mg by mouth every 6 (six) hours as needed for mild pain.   . calcium carbonate (TUMS) 500 MG chewable tablet Chew 3 tablets by mouth daily. Take three tablets once daily  . Cholecalciferol (VITAMIN D) 1000 UNITS capsule Take 2,000 Units by mouth daily.   Marland Kitchen. donepezil (ARICEPT) 10 MG tablet One daily to help preserve memory.  Marland Kitchen. loratadine (CLARITIN) 10 MG tablet Take 10 mg by mouth daily.  . [DISCONTINUED] magnesium hydroxide (MILK OF MAGNESIA) 400 MG/5ML suspension Take 30 mLs by mouth as needed for mild constipation.  No facility-administered encounter medications on file as of 11/15/2016.     Review of Systems  Constitutional: Negative for activity change and appetite change.  HENT: Positive for hearing loss. Negative for tinnitus.   Eyes: Negative for visual disturbance.  Respiratory: Negative for cough and choking.   Cardiovascular: Negative  for palpitations and leg swelling.  Gastrointestinal: Positive for constipation.  Genitourinary: Negative for difficulty urinating.       Incontinent  Musculoskeletal: Positive for arthralgias and gait problem.  Skin: Negative for rash and wound. Color change: Erythema of the distal legs.  Neurological: Negative for facial asymmetry, speech difficulty and headaches.  Psychiatric/Behavioral: Positive for confusion. Negative for agitation, behavioral problems, hallucinations and sleep disturbance.    Immunization History  Administered Date(s) Administered  . Influenza Whole 01/16/2012, 02/20/2013  . Influenza-Unspecified 02/19/2014, 01/05/2015, 02/01/2016  . PPD Test 08/23/2010, 08/04/2015  . Pneumococcal Polysaccharide-23 06/24/1998  . Td 05/02/1974   Pertinent  Health Maintenance Due  Topic Date Due  . INFLUENZA VACCINE  11/15/2016  . PNA vac Low Risk Adult (2 of 2 - PCV13) 04/17/2017 (Originally 06/24/1999)  . DEXA SCAN  10/13/2017 (Originally 03/19/1986)   Fall Risk  08/06/2015 08/06/2015 11/19/2014 11/19/2014 05/21/2014  Falls in the past year? No No Yes No Yes  Number falls in past yr: - - 1 - -  Injury with Fall? - - No - -   Functional Status Survey:    Vitals:   11/15/16 1431  BP: 120/70  Pulse: 64  Resp: 20  Temp: 98.5 F (36.9 C)  Weight: 140 lb 12.8 oz (63.9 kg)  Height: 5\' 3"  (1.6 m)   Body mass index is 24.94 kg/m. Physical Exam  Constitutional: She appears well-developed and well-nourished.  HENT:  Head: Normocephalic and atraumatic.  Mouth/Throat: Oropharynx is clear and moist.  Has dentures and corrective glasses  Eyes: Pupils are equal, round, and reactive to light. Conjunctivae are normal.  Neck: Normal range of motion. Neck supple.  Cardiovascular: Normal rate and regular rhythm.   Pulmonary/Chest: Effort normal and breath sounds normal.  Abdominal: Soft. Bowel sounds are normal.  Musculoskeletal: She exhibits edema.  Arthritis changes present, ambulates  with walker, trace edema in ankles.   Neurological: She is alert. No cranial nerve deficit.  Skin: Skin is warm and dry.  Psychiatric:  confused    Labs reviewed:  Recent Labs  09/28/16 10/17/16 10/31/16  NA 148* 141 139  K 4.0 4.2 4.2  BUN 31* 16 19  CREATININE 1.2* 1.3* 1.3*    Recent Labs  03/14/16 09/22/16  AST 22 15  ALT 17 11  ALKPHOS 88 105    Recent Labs  03/14/16 09/22/16 10/17/16  WBC 7.1 12.7 7.2  HGB 14.2 13.9 12.8  HCT 42 41  --   PLT 257 228 198   Lab Results  Component Value Date   TSH 2.72 08/04/2015   Lab Results  Component Value Date   HGBA1C 5.7 03/10/2014   Lab Results  Component Value Date   CHOL 297 (A) 04/01/2013   HDL 54 04/01/2013   LDLCALC 200 04/01/2013   TRIG 214 (A) 04/01/2013    Significant Diagnostic Results in last 30 days:  No results found.  Assessment/Plan Essential hypertension Normalized blood pressure with antihypertensive agents.   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      Family/ staff Communication: SNF Memory Care Unit  Labs/tests ordered:  none

## 2016-11-15 NOTE — Assessment & Plan Note (Signed)
Trace edema in ankles, no SOB, cough, sputum production, no O2 desaturation, continue to observe.

## 2016-11-15 NOTE — Assessment & Plan Note (Signed)
Normalized blood pressure with antihypertensive agents.

## 2016-11-15 NOTE — Assessment & Plan Note (Signed)
continue  Aricept 10mg  po qd, she needs verbal cues to function through out day, resides [facility]  for care needs

## 2016-12-15 ENCOUNTER — Non-Acute Institutional Stay (SKILLED_NURSING_FACILITY): Payer: Medicare Other

## 2016-12-15 DIAGNOSIS — Z0000 Encounter for general adult medical examination without abnormal findings: Secondary | ICD-10-CM | POA: Diagnosis not present

## 2016-12-15 NOTE — Patient Instructions (Signed)
Ms. [name] , Thank you for taking time toMalen Gauze come for your Medicare Wellness Visit. I appreciate your ongoing commitment to your health goals. Please review the following plan we discussed and let me know if I can assist you in the future.   Screening recommendations/referrals: Colonoscopy excluded, pt over age 81 Mammogram excluded, pt over age 81 Bone Density up to date Recommended yearly ophthalmology/optometry visit for glaucoma screening and checkup Recommended yearly dental visit for hygiene and checkup  Vaccinations: Influenza vaccine due Pneumococcal vaccine  Tdap vaccine due Shingles vaccine not in records  Advanced directives: In Chart  Conditions/risks identified: None  Next appointment: Dr. Glade LloydPandey makes rounds   Preventive Care 4465 Years and Older, Female Preventive care refers to lifestyle choices and visits with your health care provider that can promote health and wellness. What does preventive care include?  A yearly physical exam. This is also called an annual well check.  Dental exams once or twice a year.  Routine eye exams. Ask your health care provider how often you should have your eyes checked.  Personal lifestyle choices, including:  Daily care of your teeth and gums.  Regular physical activity.  Eating a healthy diet.  Avoiding tobacco and drug use.  Limiting alcohol use.  Practicing safe sex.  Taking low-dose aspirin every day.  Taking vitamin and mineral supplements as recommended by your health care provider. What happens during an annual well check? The services and screenings done by your health care provider during your annual well check will depend on your age, overall health, lifestyle risk factors, and family history of disease. Counseling  Your health care provider may ask you questions about your:  Alcohol use.  Tobacco use.  Drug use.  Emotional well-being.  Home and relationship well-being.  Sexual activity.  Eating  habits.  History of falls.  Memory and ability to understand (cognition).  Work and work Astronomerenvironment.  Reproductive health. Screening  You may have the following tests or measurements:  Height, weight, and BMI.  Blood pressure.  Lipid and cholesterol levels. These may be checked every 5 years, or more frequently if you are over 81 years old.  Skin check.  Lung cancer screening. You may have this screening every year starting at age 81 if you have a 30-pack-year history of smoking and currently smoke or have quit within the past 15 years.  Fecal occult blood test (FOBT) of the stool. You may have this test every year starting at age 81.  Flexible sigmoidoscopy or colonoscopy. You may have a sigmoidoscopy every 5 years or a colonoscopy every 10 years starting at age 81.  Hepatitis C blood test.  Hepatitis B blood test.  Sexually transmitted disease (STD) testing.  Diabetes screening. This is done by checking your blood sugar (glucose) after you have not eaten for a while (fasting). You may have this done every 1-3 years.  Bone density scan. This is done to screen for osteoporosis. You may have this done starting at age 81.  Mammogram. This may be done every 1-2 years. Talk to your health care provider about how often you should have regular mammograms. Talk with your health care provider about your test results, treatment options, and if necessary, the need for more tests. Vaccines  Your health care provider may recommend certain vaccines, such as:  Influenza vaccine. This is recommended every year.  Tetanus, diphtheria, and acellular pertussis (Tdap, Td) vaccine. You may need a Td booster every 10 years.  Zoster vaccine.  You may need this after age 52.  Pneumococcal 13-valent conjugate (PCV13) vaccine. One dose is recommended after age 72.  Pneumococcal polysaccharide (PPSV23) vaccine. One dose is recommended after age 18. Talk to your health care provider about which  screenings and vaccines you need and how often you need them. This information is not intended to replace advice given to you by your health care provider. Make sure you discuss any questions you have with your health care provider. Document Released: 04/30/2015 Document Revised: 12/22/2015 Document Reviewed: 02/02/2015 Elsevier Interactive Patient Education  2017 [place] Prevention in the Home Falls can cause injuries. They can happen to people of all ages. There are many things you can do to make your home safe and to help prevent falls. What can I do on the outside of my home?  Regularly fix the edges of walkways and driveways and fix any cracks.  Remove anything that might make you trip as you walk through a door, such as a raised step or threshold.  Trim any bushes or trees on the path to your home.  Use bright outdoor lighting.  Clear any walking paths of anything that might make someone trip, such as rocks or tools.  Regularly check to see if handrails are loose or broken. Make sure that both sides of any steps have handrails.  Any raised decks and porches should have guardrails on the edges.  Have any leaves, snow, or ice cleared regularly.  Use sand or salt on walking paths during winter.  Clean up any spills in your garage right away. This includes oil or grease spills. What can I do in the bathroom?  Use night lights.  Install grab bars by the toilet and in the tub and shower. Do not use towel bars as grab bars.  Use non-skid mats or decals in the tub or shower.  If you need to sit down in the shower, use a plastic, non-slip stool.  Keep the floor dry. Clean up any water that spills on the floor as soon as it happens.  Remove soap buildup in the tub or shower regularly.  Attach bath mats securely with double-sided non-slip rug tape.  Do not have throw rugs and other things on the floor that can make you trip. What can I do in the bedroom?  Use  night lights.  Make sure that you have a light by your bed that is easy to reach.  Do not use any sheets or blankets that are too big for your bed. They should not hang down onto the floor.  Have a firm chair that has side arms. You can use this for support while you get dressed.  Do not have throw rugs and other things on the floor that can make you trip. What can I do in the kitchen?  Clean up any spills right away.  Avoid walking on wet floors.  Keep items that you use a lot in easy-to-reach places.  If you need to reach something above you, use a strong step stool that has a grab bar.  Keep electrical cords out of the way.  Do not use floor polish or wax that makes floors slippery. If you must use wax, use non-skid floor wax.  Do not have throw rugs and other things on the floor that can make you trip. What can I do with my stairs?  Do not leave any items on the stairs.  Make sure that there are handrails on  both sides of the stairs and use them. Fix handrails that are broken or loose. Make sure that handrails are as long as the stairways.  Check any carpeting to make sure that it is firmly attached to the stairs. Fix any carpet that is loose or worn.  Avoid having throw rugs at the top or bottom of the stairs. If you do have throw rugs, attach them to the floor with carpet tape.  Make sure that you have a light switch at the top of the stairs and the bottom of the stairs. If you do not have them, ask someone to add them for you. What else can I do to help prevent falls?  Wear shoes that:  Do not have high heels.  Have rubber bottoms.  Are comfortable and fit you well.  Are closed at the toe. Do not wear sandals.  If you use a stepladder:  Make sure that it is fully opened. Do not climb a closed stepladder.  Make sure that both sides of the stepladder are locked into place.  Ask someone to hold it for you, if possible.  Clearly mark and make sure that you  can see:  Any grab bars or handrails.  First and last steps.  Where the edge of each step is.  Use tools that help you move around (mobility aids) if they are needed. These include:  Canes.  Walkers.  Scooters.  Crutches.  Turn on the lights when you go into a dark area. Replace any light bulbs as soon as they burn out.  Set up your furniture so you have a clear path. Avoid moving your furniture around.  If any of your floors are uneven, fix them.  If there are any pets around you, be aware of where they are.  Review your medicines with your doctor. Some medicines can make you feel dizzy. This can increase your chance of falling. Ask your doctor what other things that you can do to help prevent falls. This information is not intended to replace advice given to you by your health care provider. Make sure you discuss any questions you have with your health care provider. Document Released: 01/28/2009 Document Revised: 09/09/2015 Document Reviewed: 05/08/2014 Elsevier Interactive Patient Education  2017 [employer].

## 2016-12-15 NOTE — Progress Notes (Signed)
Subjective:   [name] is a 83 y.o. female who presents for an Initial Medicare Annual Wellness Visit at [facility]; incapacitated patient unable to answer questions appropriately        Objective:    Today's Vitals   12/15/16 1157  BP: (!) 118/58  Pulse: 70  Temp: 98.1 F (36.7 C)  TempSrc: Oral  SpO2: 93%  Weight: 141 lb (64 kg)  Height: 5\' 3"  (1.6 m)   Body mass index is 24.98 kg/m.   Current Medications (verified) Outpatient Encounter Prescriptions as of 12/15/2016  Medication Sig  . acetaminophen (TYLENOL) 325 MG tablet Take 650 mg by mouth every 6 (six) hours as needed for mild pain.   . calcium carbonate (TUMS) 500 MG chewable tablet Chew 3 tablets by mouth daily. Take three tablets once daily  . Cholecalciferol (VITAMIN D) 1000 UNITS capsule Take 2,000 Units by mouth daily.   Marland Kitchen donepezil (ARICEPT) 10 MG tablet One daily to help preserve memory.  Marland Kitchen loratadine (CLARITIN) 10 MG tablet Take 10 mg by mouth daily.   No facility-administered encounter medications on file as of 12/15/2016.     Allergies (verified) Lopid [gemfibrozil]; Sulfa antibiotics; and Zocor [simvastatin]   History: Past Medical History:  Diagnosis Date  . Abnormal mammogram, unspecified 10/25/2006  . Abnormality of gait 03/02/2011  . Allergic rhinitis due to pollen 07/13/2006  . Blood in stool 06/29/2011  . Candidiasis 07/01/2012  . Cholelithiasis 08/02/2010  . Disturbance of skin sensation 11/25/2009  . Diverticulitis of colon (without mention of hemorrhage)(562.11) 10/28/2003  . Dizziness and giddiness 07/13/2006  . Edema 07/19/2006  . Lumbago 12/22/2009  . Memory loss 02/17/2010  . Osteoarthrosis, unspecified whether generalized or localized, unspecified site 07/19/2006  . Other and unspecified hyperlipidemia 03/28/2007  . Pain in joint, upper arm 10/25/2006  . Personal history of fall 12/22/2009  . Unspecified constipation 09/26/2007  . Unspecified essential  hypertension 07/19/2006  . Unspecified vitamin D deficiency 08/19/2009  . Uterine prolapse without mention of vaginal wall prolapse 06/29/2011   Past Surgical History:  Procedure Laterality Date  . CATARACT EXTRACTION W/ INTRAOCULAR LENS  IMPLANT, BILATERAL Bilateral 1999   Family History  Problem Relation Age of Onset  . Heart disease Father        MI   Social History   Occupational History  . retired office work    Social History Main Topics  . Smoking status: Never Smoker  . Smokeless tobacco: Never Used  . Alcohol use No  . Drug use: No  . Sexual activity: No    Tobacco Counseling Counseling given: Not Answered   Activities of Daily Living In your present state of health, do you have any difficulty performing the following activities: 12/15/2016  Hearing? N  Vision? N  Difficulty concentrating or making decisions? Y  Walking or climbing stairs? Y  Dressing or bathing? Y  Doing errands, shopping? Y  Preparing Food and eating ? Y  Using the Toilet? Y  In the past six months, have you accidently leaked urine? Y  Do you have problems with loss of bowel control? Y  Managing your Medications? Y  Managing your Finances? Y  Housekeeping or managing your Housekeeping? Y  Some recent data might be hidden    Immunizations and Health Maintenance Immunization History  Administered Date(s) Administered  . Influenza Whole 01/16/2012, 02/20/2013  . Influenza-Unspecified 02/19/2014, 01/05/2015, 02/01/2016  . PPD Test 08/23/2010, 08/04/2015  . Pneumococcal Polysaccharide-23 06/24/1998  .  Td 05/02/1974   Health Maintenance Due  Topic Date Due  . INFLUENZA VACCINE  11/15/2016    Patient Care Team: Oneal GroutPandey, Mahima, MD as PCP - General (Internal Medicine) [place] Si[facility], Lenon CurtArthur G, MD as Consulting Physician (Internal Medicine) Mast, Man X, NP as Nurse Practitioner (Internal Medicine)  Indicate any recent Medical Services you may have received from other than  [facility] providers in the past year (date may be approximate).     Assessment:   This is a routine wellness examination for [name].   Hearing/Vision screen No exam data present  Dietary issues and exercise activities discussed: Current Exercise Habits: The patient does not participate in regular exercise at present, Exercise limited by: neurologic condition(s)  Goals    None     Depression Screen PHQ 2/9 Scores 12/15/2016 08/06/2015 08/06/2015 11/19/2014  PHQ - 2 Score 0 0 0 0    Fall Risk Fall Risk  12/15/2016 08/06/2015 08/06/2015 11/19/2014 11/19/2014  Falls in the past year? No No No Yes No  Number falls in past yr: - - - 1 -  Injury with Fall? - - - No -    Cognitive Function: MMSE - Mini Mental State Exam 12/15/2016 10/23/2013 10/24/2012  Not completed: Unable to complete - -  Orientation to time - 1 2  Orientation to Place - 4 5  Registration - 3 3  Attention/ Calculation - 0 5  Recall - 1 0  Language- name 2 objects - 2 2  Language- repeat - 1 1  Language- follow 3 step command - 3 3  Language- read & follow direction - 1 1  Write a sentence - 1 1  Copy design - 0 1  Total score - 17 24        Screening Tests Health Maintenance  Topic Date Due  . INFLUENZA VACCINE  11/15/2016  . PNA vac Low Risk Adult (2 of 2 - PCV13) 04/17/2017 (Originally 06/24/1999)  . DEXA SCAN  10/13/2017 (Originally 03/19/1986)  . TETANUS/TDAP  10/14/2026 (Originally 05/02/1984)      Plan:    I have personally reviewed and addressed the Medicare Annual Wellness questionnaire and have noted the following in the patient's chart:  A. Medical and social history B. Use of alcohol, tobacco or illicit drugs  C. Current medications and supplements D. Functional ability and status E.  Nutritional status F.  Physical activity G. Advance directives H. List of other physicians I.  Hospitalizations, surgeries, and ER visits in previous 12 months J.  Vitals K. Screenings to include hearing, vision,  cognitive, depression L. Referrals and appointments - none  In addition, I am unable to review and discuss with incapacitated patient certain preventive protocols, quality metrics, and best practice recommendations. A written personalized care plan for preventive services as well as general preventive health recommendations were provided to patient.  See attached scanned questionnaire for additional information.   Signed,   Annetta MawSara Gonthier, RN Nurse Health Advisor   Quick Notes   Health Maintenance: PNA 13, TDAP due. Pt will get flu vaccine when facility gets it.     Abnormal Screen: Unable to complete mental exam     Patient Concerns: None     Nurse Concerns: None

## 2016-12-21 ENCOUNTER — Encounter: Payer: Self-pay | Admitting: Internal Medicine

## 2016-12-21 ENCOUNTER — Non-Acute Institutional Stay (SKILLED_NURSING_FACILITY): Payer: Medicare Other | Admitting: Internal Medicine

## 2016-12-21 DIAGNOSIS — F02818 Dementia in other diseases classified elsewhere, unspecified severity, with other behavioral disturbance: Secondary | ICD-10-CM

## 2016-12-21 DIAGNOSIS — N183 Chronic kidney disease, stage 3 unspecified: Secondary | ICD-10-CM

## 2016-12-21 DIAGNOSIS — J3089 Other allergic rhinitis: Secondary | ICD-10-CM

## 2016-12-21 DIAGNOSIS — F0281 Dementia in other diseases classified elsewhere with behavioral disturbance: Secondary | ICD-10-CM

## 2016-12-21 DIAGNOSIS — K5901 Slow transit constipation: Secondary | ICD-10-CM | POA: Diagnosis not present

## 2016-12-21 NOTE — Progress Notes (Signed)
[facility] Room Number: 111 Place of Service:  SNF [phone](31) Provider:  Oneal GroutMahima Trong Gosling MD  Oneal GroutPandey, Jeane Cashatt, MD  Patient Care Team: Oneal GroutPandey, Keera Altidor, MD as PCP - General (Internal Medicine) [facility] Kimber RelicGreen, Arthur G, MD as Consulting Physician (Internal Medicine) Mast, Man X, NP as Nurse Practitioner (Internal Medicine)  Extended Emergency Contact Information Primary Emergency Contact: [facility] Address: [address]          [place], [place] [address] [place] Home Phone: [phone] Relation: Son Secondary Emergency Contact: [name],[name] Address: [address]          [place], [place] [address] [place] Home Phone: [phone] Relation: Daughter  Code Status:  DNR  Goals of care: Advanced Directive information Advanced Directives 12/21/2016  Does Patient Have a Medical Advance Directive? Yes  Type of Estate agentAdvance Directive Healthcare Power of [place];Living will;Out of facility DNR (pink MOST or yellow form)  Does patient want to make changes to medical advance directive? No - Patient declined  Copy of Healthcare Power of Attorney in Chart? Yes  Pre-existing out of facility DNR order (yellow form or pink MOST form) Pink MOST form placed in chart (order not valid for inpatient use);Yellow form placed in chart (order not valid for inpatient use)     Chief Complaint  Patient presents with  . Medical Management of Chronic Issues    Routine Visit     HPI:  Patient is a 83 y.o. female seen today for medical management of chronic diseases. She participates minimally in HPI and ROS. She denies any concern. No new concern from nursing. She has advanced dementia. She is incontinent with bowel and bladder. She feeds herself after tray has been set up. She participates some in group activities. No fall reported. No pressure ulcer reported. She has red area to her buttock that is clean during perineal care and  preventative cream applied.    Past Medical History:  Diagnosis Date  . Abnormal mammogram, unspecified 10/25/2006  . Abnormality of gait 03/02/2011  . Allergic rhinitis due to pollen 07/13/2006  . Blood in stool 06/29/2011  . Candidiasis 07/01/2012  . Cholelithiasis 08/02/2010  . Disturbance of skin sensation 11/25/2009  . Diverticulitis of colon (without mention of hemorrhage)(562.11) 10/28/2003  . Dizziness and giddiness 07/13/2006  . Edema 07/19/2006  . Lumbago 12/22/2009  . Memory loss 02/17/2010  . Osteoarthrosis, unspecified whether generalized or localized, unspecified site 07/19/2006  . Other and unspecified hyperlipidemia 03/28/2007  . Pain in joint, upper arm 10/25/2006  . Personal history of fall 12/22/2009  . Unspecified constipation 09/26/2007  . Unspecified essential hypertension 07/19/2006  . Unspecified vitamin D deficiency 08/19/2009  . Uterine prolapse without mention of vaginal wall prolapse 06/29/2011   Past Surgical History:  Procedure Laterality Date  . CATARACT EXTRACTION W/ INTRAOCULAR LENS  IMPLANT, BILATERAL Bilateral 1999    Allergies  Allergen Reactions  . Lopid [Gemfibrozil]   . Sulfa Antibiotics   . Zocor [Simvastatin]     Outpatient Encounter Prescriptions as of 12/21/2016  Medication Sig  . acetaminophen (TYLENOL) 325 MG tablet Take 650 mg by mouth every 6 (six) hours as needed for mild pain.   . calcium carbonate (TUMS) 500 MG chewable tablet Chew 3 tablets by mouth daily.   . Cholecalciferol (VITAMIN D) 1000 UNITS capsule Take 2,000 Units by mouth daily.   Marland Kitchen. donepezil (ARICEPT) 10 MG tablet One daily to help preserve memory.  Marland Kitchen. loratadine (  CLARITIN) 10 MG tablet Take 10 mg by mouth daily as needed.   . magnesium hydroxide (MILK OF MAGNESIA) 400 MG/5ML suspension Take 30 mLs by mouth as needed for mild constipation.   No facility-administered encounter medications on file as of 12/21/2016.     Review of Systems  Unable to perform ROS: Dementia     Immunization History  Administered Date(s) Administered  . Influenza Whole 01/16/2012, 02/20/2013  . Influenza-Unspecified 02/19/2014, 01/05/2015, 02/01/2016  . PPD Test 08/23/2010, 08/04/2015  . Pneumococcal Polysaccharide-23 06/24/1998  . Td 05/02/1974   Pertinent  Health Maintenance Due  Topic Date Due  . INFLUENZA VACCINE  02/15/2017 (Originally 11/15/2016)  . PNA vac Low Risk Adult (2 of 2 - PCV13) 04/17/2017 (Originally 06/24/1999)  . DEXA SCAN  10/13/2017 (Originally 03/19/1986)   Fall Risk  12/15/2016 08/06/2015 08/06/2015 11/19/2014 11/19/2014  Falls in the past year? No No No Yes No  Number falls in past yr: - - - 1 -  Injury with Fall? - - - No -   Functional Status Survey:    Vitals:   12/21/16 1538  BP: 120/70  Pulse: 70  Resp: 20  Temp: 98 F (36.7 C)  TempSrc: Oral  Weight: 143 lb 3.2 oz (65 kg)  Height:  (1.6 m)   Body mass index is 25.37 kg/m.   Wt Readings from Last 3 Encounters:  12/21/16 143 lb 3.2 oz (65 kg)  12/15/16 141 lb (64 kg)  11/15/16 140 lb 12.8 oz (63.9 kg)    Physical Exam  Constitutional: She appears well-developed and well-nourished. No distress.  HENT:  Head: Normocephalic and atraumatic.  Mouth/Throat: Oropharynx is clear and moist.  Has dentures and corrective glasses  Eyes: Pupils are equal, round, and reactive to light. Conjunctivae are normal.  Neck: Normal range of motion. Neck supple.  Cardiovascular: Normal rate and regular rhythm.   Pulmonary/Chest: Effort normal and breath sounds normal.  Abdominal: Soft. Bowel sounds are normal.  Musculoskeletal:  Arthritis changes present, can move all 4 extremities, trace foot edema   Lymphadenopathy:    She has no cervical adenopathy.  Neurological:  Oriented only to self  Skin: Skin is warm and dry. She is not diaphoretic.  Psychiatric:  Pleasantly confused    Labs reviewed:  Recent Labs  09/28/16 10/17/16 10/31/16  NA 148* 141 139  K 4.0 4.2 4.2  BUN 31* 16 19   CREATININE 1.2* 1.3* 1.3*    Recent Labs  03/14/16 09/22/16  AST 22 15  ALT 17 11  ALKPHOS 88 105    Recent Labs  03/14/16 09/22/16 10/17/16  WBC 7.1 12.7 7.2  HGB 14.2 13.9 12.8  HCT 42 41  --   PLT 257 228 198   Lab Results  Component Value Date   TSH 2.72 08/04/2015   Lab Results  Component Value Date   HGBA1C 5.7 03/10/2014   Lab Results  Component Value Date   CHOL 297 (A) 04/01/2013   HDL 54 04/01/2013   LDLCALC 200 04/01/2013   TRIG 214 (A) 04/01/2013    Significant Diagnostic Results in last 30 days:  No results found.  Assessment/Plan  Constipation Stable bowel movement, on prn milk of magnesia  Allergic rhinitis None at this visit. Per nursing no runny nose noted. D/c prn claritin order  Dementia Overall stable. Decline anticipated. Continue supportive care and assist with ADLs. Continue donepezil. Unable to perform MMSE.   ckd stage 3 Stable, monitor BMP periodically.  Family/ staff Communication: reviewed care plan with patient and charge nurse.    Labs/tests ordered:  none   Blanchie Serve, MD Internal Medicine [facility] [address] [place], [place] [address] Cell Phone (Monday-Friday 8 am - 5 pm): [phone] On Call: [phone] and follow prompts after 5 pm and on weekends Office Phone: [phone] Office Fax: [phone]

## 2017-01-19 ENCOUNTER — Encounter: Payer: Self-pay | Admitting: Internal Medicine

## 2017-01-19 ENCOUNTER — Non-Acute Institutional Stay (SKILLED_NURSING_FACILITY): Payer: Medicare Other | Admitting: Internal Medicine

## 2017-01-19 DIAGNOSIS — F03C0 Unspecified dementia, severe, without behavioral disturbance, psychotic disturbance, mood disturbance, and anxiety: Secondary | ICD-10-CM

## 2017-01-19 DIAGNOSIS — F0390 Unspecified dementia without behavioral disturbance: Secondary | ICD-10-CM

## 2017-01-19 DIAGNOSIS — N183 Chronic kidney disease, stage 3 unspecified: Secondary | ICD-10-CM

## 2017-01-19 DIAGNOSIS — M150 Primary generalized (osteo)arthritis: Secondary | ICD-10-CM | POA: Diagnosis not present

## 2017-01-19 DIAGNOSIS — R609 Edema, unspecified: Secondary | ICD-10-CM | POA: Diagnosis not present

## 2017-01-19 DIAGNOSIS — M159 Polyosteoarthritis, unspecified: Secondary | ICD-10-CM

## 2017-01-19 NOTE — Progress Notes (Signed)
[facility] Room Number: 111 Place of Service:  SNF [phone]) Provider:  Oneal Grout MD  Oneal Grout, MD  Patient Care Team: Oneal Grout, MD as PCP - General (Internal Medicine) [facility] [name], MD as Consulting Physician (Internal Medicine) Mast, Man X, NP as Nurse Practitioner (Internal Medicine)  Extended Emergency Contact Information Primary Emergency Contact: [facility] Address: [address]          [place], [place] [address] [place] Home Phone: [phone] Relation: Son Secondary Emergency Contact: [name],[name] Address: [address]          [place], [place] [address] [place] Home Phone: [phone] Relation: Daughter  Code Status:  DNR  Goals of care: Advanced Directive information Advanced Directives 01/19/2017  Does Patient Have a Medical Advance Directive? Yes  Type of Estate agent of [place];Living will;Out of facility DNR (pink MOST or yellow form)  Does patient want to make changes to medical advance directive? No - Patient declined  Copy of Healthcare Power of Attorney in Chart? Yes  Pre-existing out of facility DNR order (yellow form or pink MOST form) Yellow form placed in chart (order not valid for inpatient use)     Chief Complaint  Patient presents with  . Medical Management of Chronic Issues    Routine Visit     HPI:  Patient is a 83 y.o. female seen today for medical management of chronic diseases. She resides in memory care unit with advanced dementia. She participates minimally in HPI and ROS. She feeds herself after tray has been set up. No fall reported. No pressure ulcer reported. She is incontinent with bowel and bladder. She can make her needs known. She requires 1 person assist with transfer and ADLS. She ambulates with walker.   Past Medical History:  Diagnosis Date  . Abnormal mammogram, unspecified 10/25/2006  .  Abnormality of gait 03/02/2011  . Allergic rhinitis due to pollen 07/13/2006  . Blood in stool 06/29/2011  . Candidiasis 07/01/2012  . Cholelithiasis 08/02/2010  . Disturbance of skin sensation 11/25/2009  . Diverticulitis of colon (without mention of hemorrhage)(562.11) 10/28/2003  . Dizziness and giddiness 07/13/2006  . Edema 07/19/2006  . Lumbago 12/22/2009  . Memory loss 02/17/2010  . Osteoarthrosis, unspecified whether generalized or localized, unspecified site 07/19/2006  . Other and unspecified hyperlipidemia 03/28/2007  . Pain in joint, upper arm 10/25/2006  . Personal history of fall 12/22/2009  . Unspecified constipation 09/26/2007  . Unspecified essential hypertension 07/19/2006  . Unspecified vitamin D deficiency 08/19/2009  . Uterine prolapse without mention of vaginal wall prolapse 06/29/2011   Past Surgical History:  Procedure Laterality Date  . CATARACT EXTRACTION W/ INTRAOCULAR LENS  IMPLANT, BILATERAL Bilateral 1999    Allergies  Allergen Reactions  . Lopid [Gemfibrozil]   . Sulfa Antibiotics   . Zocor [Simvastatin]     Outpatient Encounter Prescriptions as of 01/19/2017  Medication Sig  . acetaminophen (TYLENOL) 325 MG tablet Take 650 mg by mouth every 6 (six) hours as needed for mild pain.   . calcium carbonate (TUMS) 500 MG chewable tablet Chew 3 tablets by mouth daily.   . Cholecalciferol (VITAMIN D) 1000 UNITS capsule Take 2,000 Units by mouth daily.   Marland Kitchen donepezil (ARICEPT) 10 MG tablet One daily to help preserve memory.  . magnesium hydroxide (MILK OF MAGNESIA) 400 MG/5ML suspension Take 30 mLs by mouth as needed for mild constipation.  . [DISCONTINUED]  loratadine (CLARITIN) 10 MG tablet Take 10 mg by mouth daily as needed.    No facility-administered encounter medications on file as of 01/19/2017.     Review of Systems  Unable to perform ROS: Dementia    Immunization History  Administered Date(s) Administered  . Influenza Whole 01/16/2012, 02/20/2013  .  Influenza-Unspecified 02/19/2014, 01/05/2015, 02/01/2016  . PPD Test 08/23/2010, 08/04/2015  . Pneumococcal Polysaccharide-23 06/24/1998  . Td 05/02/1974   Pertinent  Health Maintenance Due  Topic Date Due  . INFLUENZA VACCINE  02/15/2017 (Originally 11/15/2016)  . PNA vac Low Risk Adult (2 of 2 - PCV13) 04/17/2017 (Originally 06/24/1999)  . DEXA SCAN  10/13/2017 (Originally 03/19/1986)   Fall Risk  12/15/2016 08/06/2015 08/06/2015 11/19/2014 11/19/2014  Falls in the past year? No No No Yes No  Number falls in past yr: - - - 1 -  Injury with Fall? - - - No -   Functional Status Survey:    Vitals:   01/19/17 1113  BP: 120/68  Pulse: 70  Resp: 18  Temp: 98 F (36.7 C)  TempSrc: Oral  Weight: 147 lb (66.7 kg)  Height:  (1.6 m)   Body mass index is 26.04 kg/m.   Wt Readings from Last 3 Encounters:  01/19/17 147 lb (66.7 kg)  12/21/16 143 lb 3.2 oz (65 kg)  12/15/16 141 lb (64 kg)    Physical Exam  Constitutional: She appears well-developed and well-nourished. No distress.  HENT:  Head: Normocephalic and atraumatic.  Mouth/Throat: Oropharynx is clear and moist.  Has dentures and corrective glasses  Eyes: Pupils are equal, round, and reactive to light. Conjunctivae are normal.  Neck: Normal range of motion. Neck supple.  Cardiovascular: Normal rate and regular rhythm.   Pulmonary/Chest: Effort normal and breath sounds normal.  Abdominal: Soft. Bowel sounds are normal.  Musculoskeletal:  Arthritis changes present, can move all 4 extremities, trace foot edema   Lymphadenopathy:    She has no cervical adenopathy.  Neurological:  Oriented only to self  Skin: Skin is warm and dry. She is not diaphoretic.  Psychiatric:  Pleasantly confused    Labs reviewed:  Recent Labs  09/28/16 10/17/16 10/31/16  NA 148* 141 139  K 4.0 4.2 4.2  BUN 31* 16 19  CREATININE 1.2* 1.3* 1.3*    Recent Labs  03/14/16 09/22/16  AST 22 15  ALT 17 11  ALKPHOS 88 105    Recent Labs   03/14/16 09/22/16 10/17/16  WBC 7.1 12.7 7.2  HGB 14.2 13.9 12.8  HCT 42 41  --   PLT 257 228 198   Lab Results  Component Value Date   TSH 2.72 08/04/2015   Lab Results  Component Value Date   HGBA1C 5.7 03/10/2014   Lab Results  Component Value Date   CHOL 297 (A) 04/01/2013   HDL 54 04/01/2013   LDLCALC 200 04/01/2013   TRIG 214 (A) 04/01/2013    Significant Diagnostic Results in last 30 days:  No results found.  Assessment/Plan  OA Continue tylenol 650 mg q6h prn pain. Fall precautions. Continue calcium and vitamin d supplement  Dementia Advanced. Needs assistance with ADLs. Decline anticipated. Continue supportive care and assist with ADLs. Continue donepezil.    ckd stage 3 Stable, monitor BMP periodically.   Edema Chronic, no signs of infection, supportive care, to keep legs elevated at rest.    Family/ staff Communication: reviewed care plan with patient and charge nurse.    Labs/tests ordered:  none   Oneal Grout, MD Internal Medicine [facility] [address] [place], [place] [address] Cell Phone (Monday-Friday 8 am - 5 pm): [phone] On Call: [phone] and follow prompts after 5 pm and on weekends Office Phone: [phone] Office Fax: [phone]

## 2017-01-23 ENCOUNTER — Other Ambulatory Visit: Payer: Self-pay | Admitting: *Deleted

## 2017-01-23 LAB — BASIC METABOLIC PANEL
BUN: 25 — AB (ref 4–21)
Creatinine: 1.3 — AB (ref <=1.1)
GLUCOSE: 79
Potassium: 4.2 (ref 3.4–5.3)
Sodium: 137 (ref 137–147)

## 2017-02-27 ENCOUNTER — Non-Acute Institutional Stay (SKILLED_NURSING_FACILITY): Payer: Medicare Other | Admitting: Nurse Practitioner

## 2017-02-27 ENCOUNTER — Encounter: Payer: Self-pay | Admitting: Nurse Practitioner

## 2017-02-27 DIAGNOSIS — K5901 Slow transit constipation: Secondary | ICD-10-CM

## 2017-02-27 DIAGNOSIS — F0390 Unspecified dementia without behavioral disturbance: Secondary | ICD-10-CM | POA: Diagnosis not present

## 2017-02-27 DIAGNOSIS — F03C0 Unspecified dementia, severe, without behavioral disturbance, psychotic disturbance, mood disturbance, and anxiety: Secondary | ICD-10-CM

## 2017-02-27 DIAGNOSIS — N183 Chronic kidney disease, stage 3 unspecified: Secondary | ICD-10-CM

## 2017-02-27 NOTE — Assessment & Plan Note (Addendum)
she resides in SNF, continue Donepezil 10mg  daily for memory, her speech is not sensible, unable to make her needs known, total care for her ADLs.

## 2017-02-27 NOTE — Progress Notes (Signed)
[facility]  [facility] Room Number: 111 Place of Service:  SNF (31) Provider:  Bodie Abernethy, Manxie  NP  Oneal GroutPandey, Mahima, MD  Patient Care Team: Oneal GroutPandey, Mahima, MD as PCP - General (Internal Medicine) [facility] Kimber RelicGreen, Arthur G, MD as Consulting Physician (Internal Medicine) [place] X, NP as Nurse Practitioner (Internal Medicine)  Extended Emergency Contact Information Primary Emergency Contact: [facility] Address: [address]          [place], [place] [address] [place] Home Phone: [phone] Relation: Son Secondary Emergency Contact: [name],[name] Address: [address]          [place], [place] [address] [place] Home Phone: [phone] Relation: Daughter  Code Status:  DNR Goals of care: Advanced Directive information Advanced Directives 01/19/2017  Does Patient Have a Medical Advance Directive? Yes  Type of Estate agentAdvance Directive Healthcare Power of [place];Living will;Out of facility DNR (pink MOST or yellow form)  Does patient want to make changes to medical advance directive? No - Patient declined  Copy of Healthcare Power of Attorney in Chart? Yes  Pre-existing out of facility DNR order (yellow form or pink MOST form) Yellow form placed in chart (order not valid for inpatient use)     Chief Complaint  Patient presents with  . Medical Management of Chronic Issues    HPI:  Pt is a 83 y.o. female seen today for medical management of chronic diseases.     The patient has history of advanced dementia, she resides in SNF, takes Donepezil 10mg  daily for memory, unable to voice her needs, up in recliner during day, total care of ADLs. She denied pain or discomfort upon today's visit. Her constipation is managed with diet and MOM prn. CKD stable, last creatinine 1.3 02/02/17   Past Medical History:  Diagnosis Date  . Abnormal mammogram, unspecified 10/25/2006  . Abnormality of gait 03/02/2011  . Allergic  rhinitis due to pollen 07/13/2006  . Blood in stool 06/29/2011  . Candidiasis 07/01/2012  . Cholelithiasis 08/02/2010  . Disturbance of skin sensation 11/25/2009  . Diverticulitis of colon (without mention of hemorrhage)(562.11) 10/28/2003  . Dizziness and giddiness 07/13/2006  . Edema 07/19/2006  . Lumbago 12/22/2009  . Memory loss 02/17/2010  . Osteoarthrosis, unspecified whether generalized or localized, unspecified site 07/19/2006  . Other and unspecified hyperlipidemia 03/28/2007  . Pain in joint, upper arm 10/25/2006  . Personal history of fall 12/22/2009  . Unspecified constipation 09/26/2007  . Unspecified essential hypertension 07/19/2006  . Unspecified vitamin D deficiency 08/19/2009  . Uterine prolapse without mention of vaginal wall prolapse 06/29/2011   Past Surgical History:  Procedure Laterality Date  . CATARACT EXTRACTION W/ INTRAOCULAR LENS  IMPLANT, BILATERAL Bilateral 1999    Allergies  Allergen Reactions  . Lopid [Gemfibrozil]   . Sulfa Antibiotics   . Zocor [Simvastatin]     Outpatient Encounter Medications as of 02/27/2017  Medication Sig  . acetaminophen (TYLENOL) 325 MG tablet Take 650 mg by mouth every 6 (six) hours as needed for mild pain.   . calcium carbonate (TUMS) 500 MG chewable tablet Chew 3 tablets by mouth daily.   . Cholecalciferol (VITAMIN D) 1000 UNITS capsule Take 2,000 Units by mouth daily.   Marland Kitchen. donepezil (ARICEPT) 10 MG tablet One daily to help preserve memory.  . magnesium hydroxide (MILK OF MAGNESIA) 400 MG/5ML suspension Take 30 mLs by mouth as needed for mild constipation.   No facility-administered encounter medications on file as  of 02/27/2017.    ROS was provided with assistance of staff Review of Systems  Constitutional: Negative for activity change, appetite change, chills, diaphoresis, fatigue and fever.  HENT: Negative for congestion.   Respiratory: Negative for cough, shortness of breath and wheezing.   Cardiovascular: Negative for chest pain,  palpitations and leg swelling.  Gastrointestinal: Negative for abdominal distention, abdominal pain, constipation, diarrhea and vomiting.  Genitourinary:       Incontinent of urine  Musculoskeletal:       Non ambulatory  Skin: Negative for rash.  Neurological: Negative for tremors and weakness.       Dementia, no sensible speech  Psychiatric/Behavioral: Positive for confusion. Negative for agitation and behavioral problems.    Immunization History  Administered Date(s) Administered  . Influenza Whole 01/16/2012, 02/20/2013  . Influenza-Unspecified 02/19/2014, 01/05/2015, 02/01/2016  . PPD Test 08/23/2010, 08/04/2015  . Pneumococcal Polysaccharide-23 06/24/1998  . Td 05/02/1974   Pertinent  Health Maintenance Due  Topic Date Due  . INFLUENZA VACCINE  11/15/2016  . PNA vac Low Risk Adult (2 of 2 - PCV13) 04/17/2017 (Originally 06/24/1999)  . DEXA SCAN  10/13/2017 (Originally 03/19/1986)   Fall Risk  12/15/2016 08/06/2015 08/06/2015 11/19/2014 11/19/2014  Falls in the past year? No No No Yes No  Number falls in past yr: - - - 1 -  Injury with Fall? - - - No -   Functional Status Survey:    Vitals:   02/27/17 1049  BP: 110/60  Pulse: 64  Resp: (!) 22  Temp: 98.5 F (36.9 C)  Weight: 146 lb 6.4 oz (66.4 kg)  Height: 5\' 3"  (1.6 m)   Body mass index is 25.93 kg/m. Physical Exam  Constitutional: She appears well-developed and well-nourished. No distress.  Thin, frail.   HENT:  Head: Normocephalic and atraumatic.  Eyes:  Legally blind  Neck: Normal range of motion. Neck supple. No JVD present. No thyromegaly present.  Cardiovascular: Normal rate and regular rhythm.  No murmur heard. Pulmonary/Chest: Effort normal and breath sounds normal. She has no wheezes. She has no rales.  Abdominal: Bowel sounds are normal. She exhibits no distension. There is no tenderness.  Musculoskeletal: She exhibits no edema.  Neurological: She is alert. She exhibits normal muscle tone. Coordination  normal.  Oriented to self.   Skin: Skin is warm and dry. No rash noted. She is not diaphoretic.  Psychiatric: She has a normal mood and affect. Her behavior is normal.    Labs reviewed: Recent Labs    10/17/16 10/31/16 01/23/17  NA 141 139 137  K 4.2 4.2 4.2  BUN 16 19 25*  CREATININE 1.3* 1.3* 1.3*   Recent Labs    03/14/16 09/22/16  AST 22 15  ALT 17 11  ALKPHOS 88 105   Recent Labs    03/14/16 09/22/16 10/17/16  WBC 7.1 12.7 7.2  HGB 14.2 13.9 12.8  HCT 42 41  --   PLT 257 228 198   Lab Results  Component Value Date   TSH 2.72 08/04/2015   Lab Results  Component Value Date   HGBA1C 5.7 03/10/2014   Lab Results  Component Value Date   CHOL 297 (A) 04/01/2013   HDL 54 04/01/2013   LDLCALC 200 04/01/2013   TRIG 214 (A) 04/01/2013    Significant Diagnostic Results in last 30 days:  No results found.  Assessment/Plan Advanced dementia she resides in SNF, continue Donepezil 10mg  daily for memory, her speech is not sensible, unable to make  her needs known, total care for her ADLs.   CKD (chronic kidney disease) stage 3, GFR 30-59 ml/min stable, last creatinine 1.3 02/02/17  Slow transit constipation constipation is managed with diet and MOM      Family/ staff Communication: plan of care reviewed with the patient and charge nurse.   Labs/tests ordered:  none  Time spend 25 minutes.

## 2017-02-27 NOTE — Assessment & Plan Note (Signed)
stable, last creatinine 1.3 02/02/17

## 2017-02-27 NOTE — Assessment & Plan Note (Signed)
constipation is managed with diet and MOM

## 2017-03-16 ENCOUNTER — Encounter: Payer: Self-pay | Admitting: Nurse Practitioner

## 2017-03-16 ENCOUNTER — Non-Acute Institutional Stay (SKILLED_NURSING_FACILITY): Payer: Medicare Other | Admitting: Nurse Practitioner

## 2017-03-16 DIAGNOSIS — H9190 Unspecified hearing loss, unspecified ear: Secondary | ICD-10-CM | POA: Diagnosis not present

## 2017-03-16 DIAGNOSIS — H6121 Impacted cerumen, right ear: Secondary | ICD-10-CM

## 2017-03-16 NOTE — Assessment & Plan Note (Signed)
Uses Debrox to remove impacted ear wax of the right ear.

## 2017-03-16 NOTE — Progress Notes (Signed)
[facility]  [facility] Room Number: 111 Place of Service:  SNF (31) Provider:  Mayson Sterbenz, Manxie  NP  Oneal GroutPandey, Mahima, MD  Patient Care Team: Oneal GroutPandey, Mahima, MD as PCP - General (Internal Medicine) [facility] Kimber RelicGreen, Arthur G, MD as Consulting Physician (Internal Medicine) Amantha Sklar X, NP as Nurse Practitioner (Internal Medicine)  Extended Emergency Contact Information Primary Emergency Contact: [facility] Address: [address]          [place], [place] [address] [place] Home Phone: [phone] Relation: Son Secondary Emergency Contact: [name],[name] Address: [address]          [place], [place] [address] [place] Home Phone: [phone] Relation: Daughter  Code Status:  DNR Goals of care: Advanced Directive information Advanced Directives 03/16/2017  Does Patient Have a Medical Advance Directive? Yes  Type of Estate agentAdvance Directive Healthcare Power of [place];Out of facility DNR (pink MOST or yellow form);Living will  Does patient want to make changes to medical advance directive? No - Patient declined  Copy of Healthcare Power of Attorney in Chart? Yes  Pre-existing out of facility DNR order (yellow form or pink MOST form) Yellow form placed in chart (order not valid for inpatient use)     Chief Complaint  Patient presents with  . Acute Visit    possible wax build-up check    HPI:  Pt is a 83 y.o. female seen today for an acute visit for the patient's POA request to examine residents ears for ear wax, stated she has history of wax building up in ears. The patient has HOH, denied ear pain, or sinus pain or running nose.    Past Medical History:  Diagnosis Date  . Abnormal mammogram, unspecified 10/25/2006  . Abnormality of gait 03/02/2011  . Allergic rhinitis due to pollen 07/13/2006  . Blood in stool 06/29/2011  . Candidiasis 07/01/2012  . Cholelithiasis 08/02/2010  . Disturbance of skin sensation 11/25/2009   . Diverticulitis of colon (without mention of hemorrhage)(562.11) 10/28/2003  . Dizziness and giddiness 07/13/2006  . Edema 07/19/2006  . Lumbago 12/22/2009  . Memory loss 02/17/2010  . Osteoarthrosis, unspecified whether generalized or localized, unspecified site 07/19/2006  . Other and unspecified hyperlipidemia 03/28/2007  . Pain in joint, upper arm 10/25/2006  . Personal history of fall 12/22/2009  . Unspecified constipation 09/26/2007  . Unspecified essential hypertension 07/19/2006  . Unspecified vitamin D deficiency 08/19/2009  . Uterine prolapse without mention of vaginal wall prolapse 06/29/2011   Past Surgical History:  Procedure Laterality Date  . CATARACT EXTRACTION W/ INTRAOCULAR LENS  IMPLANT, BILATERAL Bilateral 1999    Allergies  Allergen Reactions  . Lopid [Gemfibrozil]   . Sulfa Antibiotics   . Zocor [Simvastatin]     Outpatient Encounter Medications as of 03/16/2017  Medication Sig  . acetaminophen (TYLENOL) 325 MG tablet Take 650 mg by mouth every 6 (six) hours as needed for mild pain.   . calcium carbonate (TUMS) 500 MG chewable tablet Chew 3 tablets by mouth daily.   . Cholecalciferol (VITAMIN D) 1000 UNITS capsule Take 2,000 Units by mouth daily.   Marland Kitchen. donepezil (ARICEPT) 10 MG tablet One daily to help preserve memory.  . [DISCONTINUED] magnesium hydroxide (MILK OF MAGNESIA) 400 MG/5ML suspension Take 30 mLs by mouth as needed for mild constipation.   No facility-administered encounter medications on file as of 03/16/2017.    ROS was provided with assistance of patient's POA and staff Review of Systems  Constitutional: Negative for activity change, appetite change, chills, diaphoresis, fatigue and fever.  HENT: Positive for hearing loss. Negative for congestion, ear discharge, ear pain, facial swelling, nosebleeds, rhinorrhea, sinus pressure, sinus pain, sneezing, sore throat, trouble swallowing and voice change.   Eyes: Negative for visual disturbance.  Respiratory:  Negative for cough, choking and shortness of breath.   Musculoskeletal: Positive for gait problem.       One person transfer, w/c for mobility.   Skin: Negative for color change, pallor, rash and wound.  Neurological: Negative for dizziness, tremors, syncope, facial asymmetry, speech difficulty, weakness, numbness and headaches.  Psychiatric/Behavioral: Positive for confusion. Negative for agitation and behavioral problems.    Immunization History  Administered Date(s) Administered  . Influenza Whole 01/16/2012, 02/20/2013  . Influenza-Unspecified 02/19/2014, 01/05/2015, 02/01/2016  . PPD Test 08/23/2010, 08/04/2015  . Pneumococcal Polysaccharide-23 06/24/1998  . Td 05/02/1974   Pertinent  Health Maintenance Due  Topic Date Due  . INFLUENZA VACCINE  11/15/2016  . PNA vac Low Risk Adult (2 of 2 - PCV13) 04/17/2017 (Originally 06/24/1999)  . DEXA SCAN  10/13/2017 (Originally 03/19/1986)   Fall Risk  12/15/2016 08/06/2015 08/06/2015 11/19/2014 11/19/2014  Falls in the past year? No No No Yes No  Number falls in past yr: - - - 1 -  Injury with Fall? - - - No -   Functional Status Survey:    Vitals:   03/16/17 1143  BP: 120/60  Pulse: 68  Resp: 20  Temp: 98 F (36.7 C)  SpO2: 95%  Weight: 146 lb 6.4 oz (66.4 kg)  Height: 5\' 3"  (1.6 m)   Body mass index is 25.93 kg/m. Physical Exam  Constitutional: She appears well-developed and well-nourished. No distress.  HENT:  Head: Normocephalic and atraumatic.  Right Ear: External ear normal.  Left Ear: External ear normal.  Nose: Nose normal.  Mouth/Throat: Oropharynx is clear and moist. No oropharyngeal exudate.  Impacted ear wax in the right ear  Eyes: Conjunctivae and EOM are normal. Pupils are equal, round, and reactive to light. Right eye exhibits no discharge. Left eye exhibits no discharge. No scleral icterus.  Neck: Normal range of motion. Neck supple. No JVD present. No thyromegaly present.  Neurological: She is alert. She  displays normal reflexes. No cranial nerve deficit. She exhibits normal muscle tone. Coordination normal.  Oriented to self.   Skin: Skin is warm and dry. She is not diaphoretic.  Psychiatric: She has a normal mood and affect.    Labs reviewed: Recent Labs    10/17/16 10/31/16 01/23/17  NA 141 139 137  K 4.2 4.2 4.2  BUN 16 19 25*  CREATININE 1.3* 1.3* 1.3*   Recent Labs    09/22/16  AST 15  ALT 11  ALKPHOS 105   Recent Labs    09/22/16 10/17/16  WBC 12.7 7.2  HGB 13.9 12.8  HCT 41  --   PLT 228 198   Lab Results  Component Value Date   TSH 2.72 08/04/2015   Lab Results  Component Value Date   HGBA1C 5.7 03/10/2014   Lab Results  Component Value Date   CHOL 297 (A) 04/01/2013   HDL 54 04/01/2013   LDLCALC 200 04/01/2013   TRIG 214 (A) 04/01/2013    Significant Diagnostic Results in last 30 days:  No results found.  Assessment/Plan Impacted cerumen of right ear Uses Debrox to remove impacted ear wax of the right ear.   HOH (hard of hearing) R+L, speaker needs to  adjust tone of voice and speak slowly to be heard and understood by the patient due to her hearing loss in setting of dementia.     Family/ staff Communication: plan of care reviewed with the patient and charge nurse  Time spend 25 minutes.  Labs/tests ordered:

## 2017-03-16 NOTE — Assessment & Plan Note (Signed)
R+L, speaker needs to adjust tone of voice and speak slowly to be heard and understood by the patient due to her hearing loss in setting of dementia.

## 2017-03-28 ENCOUNTER — Non-Acute Institutional Stay (SKILLED_NURSING_FACILITY): Payer: Medicare Other | Admitting: Nurse Practitioner

## 2017-03-28 DIAGNOSIS — R062 Wheezing: Secondary | ICD-10-CM | POA: Diagnosis not present

## 2017-03-28 DIAGNOSIS — F03C0 Unspecified dementia, severe, without behavioral disturbance, psychotic disturbance, mood disturbance, and anxiety: Secondary | ICD-10-CM

## 2017-03-28 DIAGNOSIS — F0390 Unspecified dementia without behavioral disturbance: Secondary | ICD-10-CM | POA: Diagnosis not present

## 2017-03-28 NOTE — Assessment & Plan Note (Addendum)
Wheezing cough x 2 days, afebrile, no O2 desaturation. CXR AP view to r/o PNA, CBC/diff, DuoNeb q8h x 48 hours, Mucinex 600mg  bid x 3 days, observe.

## 2017-03-28 NOTE — Progress Notes (Signed)
Location:   [facility] Room Number: 111 Place of Service:  SNF (31) Provider: Arna SnipeManXie Verdine Grenfell NP  Oneal GroutPandey, Mahima, MD  Patient Care Team: Oneal GroutPandey, Mahima, MD as PCP - General (Internal Medicine) [facility] Kimber RelicGreen, Arthur G, MD as Consulting Physician (Internal Medicine) Toshia Larkin X, NP as Nurse Practitioner (Internal Medicine)  Extended Emergency Contact Information Primary Emergency Contact: [facility] Address: [address]          [place], [place] [address] [place] Home Phone: [phone] Relation: Son Secondary Emergency Contact: [name],[name] Address: [address]          [place], [place] [address] [place] Home Phone: [phone] Relation: Daughter  Code Status: DNR Goals of care: Advanced Directive information Advanced Directives 03/16/2017  Does Patient Have a Medical Advance Directive? Yes  Type of Estate agentAdvance Directive Healthcare Power of [place];Out of facility DNR (pink MOST or yellow form);Living will  Does patient want to make changes to medical advance directive? No - Patient declined  Copy of Healthcare Power of Attorney in Chart? Yes  Pre-existing out of facility DNR order (yellow form or pink MOST form) Yellow form placed in chart (order not valid for inpatient use)     No chief complaint on file.   HPI:  Pt is a 83 y.o. female seen today for an acute visit for reported the wheezing cough x 2 days, no phlegm production, no O2 desaturation, she is afebrile. [facility].   Past Medical History:  Diagnosis Date  . Abnormal mammogram, unspecified 10/25/2006  . Abnormality of gait 03/02/2011  . Allergic rhinitis due to pollen 07/13/2006  . Blood in stool 06/29/2011  . Candidiasis 07/01/2012  . Cholelithiasis 08/02/2010  . Disturbance of skin sensation 11/25/2009  . Diverticulitis of colon (without mention of hemorrhage)(562.11) 10/28/2003  . Dizziness and  giddiness 07/13/2006  . Edema 07/19/2006  . Lumbago 12/22/2009  . Memory loss 02/17/2010  . Osteoarthrosis, unspecified whether generalized or localized, unspecified site 07/19/2006  . Other and unspecified hyperlipidemia 03/28/2007  . Pain in joint, upper arm 10/25/2006  . Personal history of fall 12/22/2009  . Unspecified constipation 09/26/2007  . Unspecified essential hypertension 07/19/2006  . Unspecified vitamin D deficiency 08/19/2009  . Uterine prolapse without mention of vaginal wall prolapse 06/29/2011   Past Surgical History:  Procedure Laterality Date  . CATARACT EXTRACTION W/ INTRAOCULAR LENS  IMPLANT, BILATERAL Bilateral 1999    Allergies  Allergen Reactions  . Lopid [Gemfibrozil]   . Sulfa Antibiotics   . Zocor [Simvastatin]     Allergies as of 03/28/2017      Reactions   Lopid [gemfibrozil]    Sulfa Antibiotics    Zocor [simvastatin]       Medication List        Accurate as of 03/28/17 11:59 PM. Always use your most recent med list.          acetaminophen 325 MG tablet Commonly known as:  TYLENOL Take 650 mg by mouth every 6 (six) hours as needed for mild pain.   donepezil 10 MG tablet Commonly known as:  ARICEPT One daily to help preserve memory.   TUMS 500 MG chewable tablet Generic drug:  calcium carbonate Chew 3 tablets by mouth daily.   Vitamin D 1000 units capsule Take 2,000 Units by mouth daily.      ROS was provided with assistance of staff.  Review of  Systems  Constitutional: Positive for fatigue. Negative for activity change, appetite change, chills, diaphoresis and fever.  HENT: Positive for hearing loss. Negative for congestion, trouble swallowing and voice change.   Respiratory: Positive for cough and wheezing. Negative for choking, chest tightness and shortness of breath.   Cardiovascular: Negative for chest pain, palpitations and leg swelling.  Musculoskeletal: Positive for gait problem. Negative for arthralgias, back pain and neck  stiffness.  Neurological: Negative for tremors, speech difficulty, weakness and headaches.       Dementia.   Psychiatric/Behavioral: Positive for confusion. Negative for agitation and behavioral problems.    Immunization History  Administered Date(s) Administered  . Influenza Whole 01/16/2012, 02/20/2013  . Influenza-Unspecified 02/19/2014, 01/05/2015, 02/01/2016  . PPD Test 08/23/2010, 08/04/2015  . Pneumococcal Polysaccharide-23 06/24/1998  . Td 05/02/1974   Pertinent  Health Maintenance Due  Topic Date Due  . INFLUENZA VACCINE  11/15/2016  . PNA vac Low Risk Adult (2 of 2 - PCV13) 04/17/2017 (Originally 06/24/1999)  . DEXA SCAN  10/13/2017 (Originally 03/19/1986)   Fall Risk  12/15/2016 08/06/2015 08/06/2015 11/19/2014 11/19/2014  Falls in the past year? No No No Yes No  Number falls in past yr: - - - 1 -  Injury with Fall? - - - No -   Functional Status Survey:    Vitals:   03/28/17 1528  BP: (!) 100/50  Pulse: 66  Resp: 18  Temp: 98.6 F (37 C)  SpO2: 93%   There is no height or weight on file to calculate BMI. Physical Exam  Constitutional: She appears well-developed and well-nourished.  HENT:  Head: Normocephalic and atraumatic.  Eyes: EOM are normal. Pupils are equal, round, and reactive to light.  Neck: Neck supple. No JVD present. No thyromegaly present.  Cardiovascular: Normal rate and regular rhythm.  No murmur heard. Pulmonary/Chest: Effort normal. She has wheezes. She has no rales.  Musculoskeletal: Normal range of motion. She exhibits no edema or tenderness.  One person transfer, ambulates with walker.   Neurological: She is alert. She exhibits normal muscle tone. Coordination normal.  Oriented to self.   Psychiatric: She has a normal mood and affect. Her behavior is normal.    Labs reviewed: Recent Labs    10/17/16 10/31/16 01/23/17  NA 141 139 137  K 4.2 4.2 4.2  BUN 16 19 25*  CREATININE 1.3* 1.3* 1.3*   Recent Labs    09/22/16  AST 15  ALT 11    ALKPHOS 105   Recent Labs    09/22/16 10/17/16  WBC 12.7 7.2  HGB 13.9 12.8  HCT 41  --   PLT 228 198   Lab Results  Component Value Date   TSH 2.72 08/04/2015   Lab Results  Component Value Date   HGBA1C 5.7 03/10/2014   Lab Results  Component Value Date   CHOL 297 (A) 04/01/2013   HDL 54 04/01/2013   LDLCALC 200 04/01/2013   TRIG 214 (A) 04/01/2013    Significant Diagnostic Results in last 30 days:  No results found.  Assessment/Plan: Wheeze Wheezing cough x 2 days, afebrile, no O2 desaturation. CXR AP view to r/o PNA, CBC/diff, DuoNeb q8h x 48 hours, Mucinex 600mg  bid x 3 days, observe.   Advanced dementia The patient resides in Memory Care Unit @ [facility] due to her advanced dementia, she takes Donepezil 10mg  po daily to reserve her memory. She ambulates with walker, one person transfer.     Family/ staff Communication: plan of care reviewed  with the patient and charge nurse  Labs/tests ordered:  CXR AP, CBC/diff  Time spend 25 minutes.

## 2017-03-29 ENCOUNTER — Encounter: Payer: Self-pay | Admitting: Nurse Practitioner

## 2017-03-29 NOTE — Assessment & Plan Note (Signed)
The patient resides in Memory Care Unit @ [facility] due to her advanced dementia, she takes Donepezil 10mg  po daily to reserve her memory. She ambulates with walker, one person transfer.

## 2017-04-05 ENCOUNTER — Non-Acute Institutional Stay (SKILLED_NURSING_FACILITY): Payer: Medicare Other | Admitting: Internal Medicine

## 2017-04-05 ENCOUNTER — Encounter: Payer: Self-pay | Admitting: Internal Medicine

## 2017-04-05 DIAGNOSIS — F0390 Unspecified dementia without behavioral disturbance: Secondary | ICD-10-CM | POA: Diagnosis not present

## 2017-04-05 DIAGNOSIS — J209 Acute bronchitis, unspecified: Secondary | ICD-10-CM | POA: Diagnosis not present

## 2017-04-05 DIAGNOSIS — R05 Cough: Secondary | ICD-10-CM

## 2017-04-05 DIAGNOSIS — F03C0 Unspecified dementia, severe, without behavioral disturbance, psychotic disturbance, mood disturbance, and anxiety: Secondary | ICD-10-CM

## 2017-04-05 DIAGNOSIS — R059 Cough, unspecified: Secondary | ICD-10-CM

## 2017-04-05 DIAGNOSIS — I10 Essential (primary) hypertension: Secondary | ICD-10-CM | POA: Diagnosis not present

## 2017-04-05 NOTE — Progress Notes (Signed)
Location:  [facility] Room Number: 111 Place of Service:  SNF [phone]) Provider:  Oneal Grout MD  Oneal Grout, MD  Patient Care Team: Oneal Grout, MD as PCP - General (Internal Medicine) [facility] [name], MD as Consulting Physician (Internal Medicine) Mast, Man X, NP as Nurse Practitioner (Internal Medicine)  Extended Emergency Contact Information Primary Emergency Contact: [facility] Address: [address]          [place], [place] [address] [place] Home Phone: [phone] Relation: Son Secondary Emergency Contact: [name],[name] Address: [address]          [place], [place] [address] [place] Home Phone: [phone] Relation: Daughter  Code Status:  DNR  Goals of care: Advanced Directive information Advanced Directives 04/05/2017  Does Patient Have a Medical Advance Directive? Yes  Type of Estate agent of [place];Living will;Out of facility DNR (pink MOST or yellow form)  Does patient want to make changes to medical advance directive? No - Patient declined  Copy of Healthcare Power of Attorney in Chart? Yes  Pre-existing out of facility DNR order (yellow form or pink MOST form) Yellow form placed in chart (order not valid for inpatient use)     Chief Complaint  Patient presents with  . Medical Management of Chronic Issues    Routine Visit    HPI:  Pt is a 83 y.o. female seen today for medical management of chronic diseases. She has been having increased cough with wheezing and chest congestion for few days. Chest xray and blood work showed no signs of infection or fluid overload. She has history of allergic rhinitis. Appetite has been fair. No fever reported. She has refused nebulizer treatments. She has advanced dementia and does not participate in HPI and ROS.    Past Medical History:  Diagnosis Date  . Abnormal mammogram, unspecified 10/25/2006  .  Abnormality of gait 03/02/2011  . Allergic rhinitis due to pollen 07/13/2006  . Blood in stool 06/29/2011  . Candidiasis 07/01/2012  . Cholelithiasis 08/02/2010  . Disturbance of skin sensation 11/25/2009  . Diverticulitis of colon (without mention of hemorrhage)(562.11) 10/28/2003  . Dizziness and giddiness 07/13/2006  . Edema 07/19/2006  . Lumbago 12/22/2009  . Memory loss 02/17/2010  . Osteoarthrosis, unspecified whether generalized or localized, unspecified site 07/19/2006  . Other and unspecified hyperlipidemia 03/28/2007  . Pain in joint, upper arm 10/25/2006  . Personal history of fall 12/22/2009  . Unspecified constipation 09/26/2007  . Unspecified essential hypertension 07/19/2006  . Unspecified vitamin D deficiency 08/19/2009  . Uterine prolapse without mention of vaginal wall prolapse 06/29/2011   Past Surgical History:  Procedure Laterality Date  . CATARACT EXTRACTION W/ INTRAOCULAR LENS  IMPLANT, BILATERAL Bilateral 1999    Allergies  Allergen Reactions  . Lopid [Gemfibrozil]   . Sulfa Antibiotics   . Zocor [Simvastatin]     Outpatient Encounter Medications as of 04/05/2017  Medication Sig  . acetaminophen (TYLENOL) 325 MG tablet Take 650 mg by mouth every 6 (six) hours as needed for mild pain.   . calcium carbonate (TUMS) 500 MG chewable tablet Chew 3 tablets by mouth daily.   . Cholecalciferol (VITAMIN D) 1000 UNITS capsule Take 2,000 Units by mouth daily.   Marland Kitchen donepezil (ARICEPT) 10 MG tablet One daily to help preserve memory.  . magnesium hydroxide (MILK OF MAGNESIA) 400 MG/5ML suspension Take 30 mLs by mouth as needed for mild constipation.   No facility-administered  encounter medications on file as of 04/05/2017.     Review of Systems  Unable to perform ROS: Dementia    Immunization History  Administered Date(s) Administered  . Influenza Whole 01/16/2012, 02/20/2013  . Influenza-Unspecified 02/19/2014, 01/05/2015, 02/01/2016  . PPD Test 08/23/2010, 08/04/2015  .  Pneumococcal Polysaccharide-23 06/24/1998  . Td 05/02/1974   Pertinent  Health Maintenance Due  Topic Date Due  . INFLUENZA VACCINE  11/15/2016  . PNA vac Low Risk Adult (2 of 2 - PCV13) 04/17/2017 (Originally 06/24/1999)  . DEXA SCAN  10/13/2017 (Originally 03/19/1986)   Fall Risk  12/15/2016 08/06/2015 08/06/2015 11/19/2014 11/19/2014  Falls in the past year? No No No Yes No  Number falls in past yr: - - - 1 -  Injury with Fall? - - - No -   Functional Status Survey:    Vitals:   04/05/17 1210  BP: 120/70  Pulse: 68  Resp: 18  Temp: 98.6 F (37 C)  TempSrc: Oral  SpO2: 93%  Weight: 150 lb (68 kg)  Height: 5\' 3"  (1.6 m)   Body mass index is 26.57 kg/m.   Wt Readings from Last 3 Encounters:  04/05/17 150 lb (68 kg)  03/28/17 150 lb (68 kg)  03/16/17 146 lb 6.4 oz (66.4 kg)   Physical Exam  Constitutional: She appears well-developed and well-nourished. No distress.  HENT:  Head: Normocephalic and atraumatic.  Mouth/Throat: Oropharynx is clear and moist. No oropharyngeal exudate.  Eyes: Conjunctivae are normal. Pupils are equal, round, and reactive to light.  Neck: Neck supple.  Cardiovascular: Normal rate and regular rhythm.  Pulmonary/Chest: Effort normal. No respiratory distress. She has wheezes. She has rales. She exhibits no tenderness.  Abdominal: Soft. There is no tenderness.  Musculoskeletal: She exhibits no edema.  Lymphadenopathy:    She has no cervical adenopathy.  Neurological: She is alert.  Oriented only to self  Skin: Skin is warm and dry. She is not diaphoretic.  Psychiatric: She has a normal mood and affect.    Labs reviewed: Recent Labs    10/17/16 10/31/16 01/23/17  NA 141 139 137  K 4.2 4.2 4.2  BUN 16 19 25*  CREATININE 1.3* 1.3* 1.3*   Recent Labs    09/22/16  AST 15  ALT 11  ALKPHOS 105   Recent Labs    09/22/16 10/17/16  WBC 12.7 7.2  HGB 13.9 12.8  HCT 41  --   PLT 228 198   Lab Results  Component Value Date   TSH 2.72  08/04/2015   Lab Results  Component Value Date   HGBA1C 5.7 03/10/2014   Lab Results  Component Value Date   CHOL 297 (A) 04/01/2013   HDL 54 04/01/2013   LDLCALC 200 04/01/2013   TRIG 214 (A) 04/01/2013    Significant Diagnostic Results in last 30 days:   03/29/17 chest xray no acute cardiopulmonary findings, chronic appearing linear interstitial prominence and hyperlucency thought to be from chronic lung disease, no interstitial edema or pulmonary vascular congestion   Assessment/Plan  Cough Increased cough x > 1 week with chest congestion and wheezing. Oxygen saturation stable. Afebrile. Vital signs are stable on review. No use of accessory muscles. No signs of fluid overload. She is at risk for aspiraton with dementia. Obtain SLP consult. See below for supportive care.   Acute bronchitis Afebrile, ongoing cough > 5 days with wheeze and rales. cxr has ruled out pneumonia. Chronic lung disease changes on chest xray. Start mucinex 1200 mg  bid x 2 weeks. duoneb every 8 hours x 1 week with her wheezing. Supportive care for now. If cough persists obtain repeat chest xray in 1 week.   Hypertension Vitals signs stable. Not on antihypertensive. Monitor clinically  Dementia Advanced. Supportive care and continue aricept.      Family/ staff Communication: reviewed care plan with patient and charge nurse.    Labs/tests ordered:  none   Oneal GroutMAHIMA Shanyn Preisler, MD Internal Medicine [facility] [address] [place], [place] [address] Cell Phone (Monday-Friday 8 am - 5 pm): [phone] On Call: [phone] and follow prompts after 5 pm and on weekends Office Phone: [phone] Office Fax: [phone]

## 2017-05-08 ENCOUNTER — Non-Acute Institutional Stay (SKILLED_NURSING_FACILITY): Payer: Medicare Other | Admitting: Nurse Practitioner

## 2017-05-08 ENCOUNTER — Encounter: Payer: Self-pay | Admitting: Nurse Practitioner

## 2017-05-08 DIAGNOSIS — F03C0 Unspecified dementia, severe, without behavioral disturbance, psychotic disturbance, mood disturbance, and anxiety: Secondary | ICD-10-CM

## 2017-05-08 DIAGNOSIS — M159 Polyosteoarthritis, unspecified: Secondary | ICD-10-CM

## 2017-05-08 DIAGNOSIS — I872 Venous insufficiency (chronic) (peripheral): Secondary | ICD-10-CM

## 2017-05-08 DIAGNOSIS — M150 Primary generalized (osteo)arthritis: Secondary | ICD-10-CM

## 2017-05-08 DIAGNOSIS — F0390 Unspecified dementia without behavioral disturbance: Secondary | ICD-10-CM | POA: Diagnosis not present

## 2017-05-09 DIAGNOSIS — I872 Venous insufficiency (chronic) (peripheral): Secondary | ICD-10-CM | POA: Insufficient documentation

## 2017-05-09 NOTE — Assessment & Plan Note (Signed)
dementia, resides in Memory Care Unit, [facility], [facility], ambulates with walker for short distance, wheelchair to go further. She takes Donepezil for memory. She usually is continent of urine, occasional urinary leakage occurs sometimes. She transfers self and toileting self most of time.

## 2017-05-09 NOTE — Assessment & Plan Note (Addendum)
She takes Ca, Vit D daily for bone health, prn Tylenol is available to her.

## 2017-05-09 NOTE — Assessment & Plan Note (Signed)
Chronic trace edema seen in BLE, usually L>R, continue compression hosiery. Observe.

## 2017-05-09 NOTE — Progress Notes (Signed)
Location:   S[facility] Room Number: 111 Place of Service:  SNF (31) Provider: Arna Snipe Yukie Bergeron NP  Oneal Grout, MD  Patient Care Team: Oneal Grout, MD as PCP - General (Internal Medicine) [place] [name], MD as Consulting Physician (Internal Medicine) Othal Kubitz X, NP as Nurse Practitioner (Internal Medicine)  Extended Emergency Contact Information Primary Emergency Contact: [facility] Address: [address]          [place], [place] [address] [place] Home Phone: [phone] Relation: Son Secondary Emergency Contact: [name],[name] Address: [address]          [place], [place] [address] [place] Home Phone: [phone] Relation: Daughter  Code Status:  DNR Goals of care: Advanced Directive information Advanced Directives 05/08/2017  Does Patient Have a Medical Advance Directive? Yes  Type of Estate agent of [place];Living will;Out of facility DNR (pink MOST or yellow form)  Does patient want to make changes to medical advance directive? No - Patient declined  Copy of Healthcare Power of Attorney in Chart? Yes  Pre-existing out of facility DNR order (yellow form or pink MOST form) Yellow form placed in chart (order not valid for inpatient use)     Chief Complaint  Patient presents with  . Medical Management of Chronic Issues    Routine Visit     HPI:  Pt is a 84 y.o. female seen today for medical management of chronic diseases.      The patient has history of dementia, resides in Memory Care Unit, SNF, [facility], ambulates with walker for short distance, wheelchair to go further. She takes Donepezil for memory. She usually is continent of urine, occasional urinary leakage occurs sometimes. She transfers self and toileting self most of time. She takes Ca, Vit D daily for bone health.    Past Medical History:  Diagnosis Date  . Abnormal mammogram, unspecified 10/25/2006  . Abnormality of  gait 03/02/2011  . Allergic rhinitis due to pollen 07/13/2006  . Blood in stool 06/29/2011  . Candidiasis 07/01/2012  . Cholelithiasis 08/02/2010  . Disturbance of skin sensation 11/25/2009  . Diverticulitis of colon (without mention of hemorrhage)(562.11) 10/28/2003  . Dizziness and giddiness 07/13/2006  . Edema 07/19/2006  . Lumbago 12/22/2009  . Memory loss 02/17/2010  . Osteoarthrosis, unspecified whether generalized or localized, unspecified site 07/19/2006  . Other and unspecified hyperlipidemia 03/28/2007  . Pain in joint, upper arm 10/25/2006  . Personal history of fall 12/22/2009  . Unspecified constipation 09/26/2007  . Unspecified essential hypertension 07/19/2006  . Unspecified vitamin D deficiency 08/19/2009  . Uterine prolapse without mention of vaginal wall prolapse 06/29/2011   Past Surgical History:  Procedure Laterality Date  . CATARACT EXTRACTION W/ INTRAOCULAR LENS  IMPLANT, BILATERAL Bilateral 1999    Allergies  Allergen Reactions  . Lopid [Gemfibrozil]   . Sulfa Antibiotics   . Zocor [Simvastatin]     Allergies as of 05/08/2017      Reactions   Lopid [gemfibrozil]    Sulfa Antibiotics    Zocor [simvastatin]       Medication List        Accurate as of 05/08/17 11:59 PM. Always use your most recent med list.          acetaminophen 325 MG tablet Commonly known as:  TYLENOL Take 650 mg by mouth every 6 (six) hours as needed for mild pain.   donepezil 10 MG tablet Commonly known as:  ARICEPT  One daily to help preserve memory.   magnesium hydroxide 400 MG/5ML suspension Commonly known as:  MILK OF MAGNESIA Take 30 mLs by mouth as needed for mild constipation.   TUMS 500 MG chewable tablet Generic drug:  calcium carbonate Chew 3 tablets by mouth daily.   Vitamin D 1000 units capsule Take 2,000 Units by mouth daily.       Review of Systems  Constitutional: Negative for activity change, chills, diaphoresis, fatigue and fever.  HENT: Positive for hearing  loss. Negative for congestion, trouble swallowing and voice change.   Eyes: Negative for visual disturbance.  Respiratory: Negative for cough, choking, shortness of breath and wheezing.   Cardiovascular: Positive for leg swelling. Negative for chest pain and palpitations.  Gastrointestinal: Negative for abdominal distention, abdominal pain, constipation, diarrhea, nausea and vomiting.  Endocrine: Negative for cold intolerance.  Genitourinary: Negative for difficulty urinating, dysuria and urgency.  Musculoskeletal: Positive for gait problem. Negative for joint swelling.  Skin: Negative for color change and pallor.  Neurological: Negative for tremors, speech difficulty, weakness and headaches.       Dementia  Psychiatric/Behavioral: Positive for confusion. Negative for agitation, behavioral problems, hallucinations and sleep disturbance. The patient is not nervous/anxious.     Immunization History  Administered Date(s) Administered  . Influenza Whole 01/16/2012, 02/20/2013  . Influenza-Unspecified 02/19/2014, 01/05/2015, 02/01/2016  . PPD Test 08/23/2010, 08/04/2015  . Pneumococcal Polysaccharide-23 06/24/1998  . Td 05/02/1974   Pertinent  Health Maintenance Due  Topic Date Due  . PNA vac Low Risk Adult (2 of 2 - PCV13) 06/24/1999  . INFLUENZA VACCINE  11/15/2016  . DEXA SCAN  10/13/2017 (Originally 03/19/1986)   Fall Risk  12/15/2016 08/06/2015 08/06/2015 11/19/2014 11/19/2014  Falls in the past year? No No No Yes No  Number falls in past yr: - - - 1 -  Injury with Fall? - - - No -   Functional Status Survey:    Vitals:   05/08/17 1434  BP: 120/70  Pulse: 68  Resp: 20  Temp: (!) 97.2 F (36.2 C)  TempSrc: Oral  SpO2: 97%  Weight: 147 lb 9.6 oz (67 kg)  Height: 5\' 3"  (1.6 m)   Body mass index is 26.15 kg/m. Physical Exam  Constitutional: She appears well-developed and well-nourished.  HENT:  Head: Normocephalic and atraumatic.  Eyes: Conjunctivae and EOM are normal.  Pupils are equal, round, and reactive to light.  Neck: Normal range of motion. Neck supple. No JVD present. No thyromegaly present.  Cardiovascular: Normal rate, regular rhythm and normal heart sounds.  No murmur heard. Pulmonary/Chest: Effort normal and breath sounds normal. She has no wheezes. She has no rales.  Abdominal: Soft. Bowel sounds are normal. She exhibits no distension. There is no tenderness.  Musculoskeletal: Normal range of motion. She exhibits edema. She exhibits no tenderness.  Trace edema BLE L>R  Neurological: She is alert. She exhibits normal muscle tone. Coordination normal.  Oriented to self and her room on unit.   Skin: Skin is warm and dry.  Psychiatric: She has a normal mood and affect. Her behavior is normal.    Labs reviewed: Recent Labs    10/17/16 10/31/16 01/23/17  NA 141 139 137  K 4.2 4.2 4.2  BUN 16 19 25*  CREATININE 1.3* 1.3* 1.3*   Recent Labs    09/22/16  AST 15  ALT 11  ALKPHOS 105   Recent Labs    09/22/16 10/17/16  WBC 12.7 7.2  HGB 13.9 12.8  HCT 41  --   PLT 228 198   Lab Results  Component Value Date   TSH 2.72 08/04/2015   Lab Results  Component Value Date   HGBA1C 5.7 03/10/2014   Lab Results  Component Value Date   CHOL 297 (A) 04/01/2013   HDL 54 04/01/2013   LDLCALC 200 04/01/2013   TRIG 214 (A) 04/01/2013    Significant Diagnostic Results in last 30 days:  No results found.  Assessment/Plan  Advanced dementia dementia, resides in Memory Care Unit, SNF, [facility], ambulates with walker for short distance, wheelchair to go further. She takes Donepezil for memory. She usually is continent of urine, occasional urinary leakage occurs sometimes. She transfers self and toileting self most of time.    Osteoarthritis (arthritis due to wear and tear of joints) She takes Ca, Vit D daily for bone health, prn Tylenol is available to her.   Venous insufficiency of both lower extremities Chronic trace edema seen in BLE,  usually L>R, continue compression hosiery. Observe.    Family/ staff Communication: plan of care reviewed with the patient and charge nurse  Labs/tests ordered:  none  Time spend 25 minutes

## 2017-06-07 ENCOUNTER — Non-Acute Institutional Stay (SKILLED_NURSING_FACILITY): Payer: Medicare Other | Admitting: Internal Medicine

## 2017-06-07 ENCOUNTER — Encounter: Payer: Self-pay | Admitting: Internal Medicine

## 2017-06-07 DIAGNOSIS — F03C0 Unspecified dementia, severe, without behavioral disturbance, psychotic disturbance, mood disturbance, and anxiety: Secondary | ICD-10-CM

## 2017-06-07 DIAGNOSIS — K5901 Slow transit constipation: Secondary | ICD-10-CM | POA: Diagnosis not present

## 2017-06-07 DIAGNOSIS — F0390 Unspecified dementia without behavioral disturbance: Secondary | ICD-10-CM | POA: Diagnosis not present

## 2017-06-07 DIAGNOSIS — E559 Vitamin D deficiency, unspecified: Secondary | ICD-10-CM | POA: Diagnosis not present

## 2017-06-07 DIAGNOSIS — M150 Primary generalized (osteo)arthritis: Secondary | ICD-10-CM | POA: Diagnosis not present

## 2017-06-07 DIAGNOSIS — M159 Polyosteoarthritis, unspecified: Secondary | ICD-10-CM

## 2017-06-07 NOTE — Progress Notes (Signed)
Location:  [facility] Room Number: 111 Place of Service:  SNF [phone]) Provider:  Oneal Grout MD  Oneal Grout, MD  Patient Care Team: Oneal Grout, MD as PCP - General (Internal Medicine) [place] [name], MD as Consulting Physician (Internal Medicine) Mast, Man X, NP as Nurse Practitioner (Internal Medicine)  Extended Emergency Contact Information Primary Emergency Contact: [facility] Address: [address]          [place], [place] [address] [place] Home Phone: [phone] Relation: Son Secondary Emergency Contact: [name],[name] Address: [address]          [place], [place] [address] [place] Home Phone: [phone] Relation: Daughter  Goals of care: Advanced Directive information Advanced Directives 06/07/2017  Does Patient Have a Medical Advance Directive? Yes  Type of Advance Directive Living will;Healthcare Power of [place];Out of facility DNR (pink MOST or yellow form)  Does patient want to make changes to medical advance directive? No - Patient declined  Copy of Healthcare Power of Attorney in Chart? Yes  Pre-existing out of facility DNR order (yellow form or pink MOST form) Yellow form placed in chart (order not valid for inpatient use)     Chief Complaint  Patient presents with  . Medical Management of Chronic Issues    Routine Visit     HPI:  Pt is a 84 y.o. female seen today for routine visit. She resides in memory care unit and needs assistance with her ADLs. Blood pressure readings stable on chart review. No acute concern from nursing. Patient does not participate in HPI and ROS.   Past Medical History:  Diagnosis Date  . Abnormal mammogram, unspecified 10/25/2006  . Abnormality of gait 03/02/2011  . Allergic rhinitis due to pollen 07/13/2006  . Blood in stool 06/29/2011  . Candidiasis 07/01/2012  . Cholelithiasis 08/02/2010  . Disturbance of skin sensation 11/25/2009  .  Diverticulitis of colon (without mention of hemorrhage)(562.11) 10/28/2003  . Dizziness and giddiness 07/13/2006  . Edema 07/19/2006  . Lumbago 12/22/2009  . Memory loss 02/17/2010  . Osteoarthrosis, unspecified whether generalized or localized, unspecified site 07/19/2006  . Other and unspecified hyperlipidemia 03/28/2007  . Pain in joint, upper arm 10/25/2006  . Personal history of fall 12/22/2009  . Unspecified constipation 09/26/2007  . Unspecified essential hypertension 07/19/2006  . Unspecified vitamin D deficiency 08/19/2009  . Uterine prolapse without mention of vaginal wall prolapse 06/29/2011   Past Surgical History:  Procedure Laterality Date  . CATARACT EXTRACTION W/ INTRAOCULAR LENS  IMPLANT, BILATERAL Bilateral 1999    Allergies  Allergen Reactions  . Lopid [Gemfibrozil]   . Sulfa Antibiotics   . Zocor [Simvastatin]     Outpatient Encounter Medications as of 06/07/2017  Medication Sig  . acetaminophen (TYLENOL) 325 MG tablet Take 650 mg by mouth every 6 (six) hours as needed for mild pain.   . calcium carbonate (TUMS) 500 MG chewable tablet Chew 3 tablets by mouth daily.   . Cholecalciferol (VITAMIN D) 1000 UNITS capsule Take 2,000 Units by mouth daily.   Marland Kitchen donepezil (ARICEPT) 10 MG tablet One daily to help preserve memory.  . magnesium hydroxide (MILK OF MAGNESIA) 400 MG/5ML suspension Take 30 mLs by mouth as needed for mild constipation.   No facility-administered encounter medications on file as of 06/07/2017.     Review of Systems  Unable to perform ROS: Dementia  Constitutional: Negative for fever.  HENT: Negative for congestion.   Eyes:  Positive for visual disturbance.  Respiratory: Negative for shortness of breath.   Musculoskeletal: Positive for gait problem.  Hematological: Bruises/bleeds easily.  Psychiatric/Behavioral: Positive for confusion.    Immunization History  Administered Date(s) Administered  . Influenza Whole 01/16/2012, 02/20/2013  .  Influenza-Unspecified 02/19/2014, 01/05/2015, 02/01/2016  . PPD Test 08/23/2010, 08/04/2015  . Pneumococcal Polysaccharide-23 06/24/1998  . Td 05/02/1974   Pertinent  Health Maintenance Due  Topic Date Due  . PNA vac Low Risk Adult (2 of 2 - PCV13) 06/24/1999  . INFLUENZA VACCINE  11/15/2016  . DEXA SCAN  10/13/2017 (Originally 03/19/1986)   Fall Risk  12/15/2016 08/06/2015 08/06/2015 11/19/2014 11/19/2014  Falls in the past year? No No No Yes No  Number falls in past yr: - - - 1 -  Injury with Fall? - - - No -   Functional Status Survey:    Vitals:   06/07/17 1007  BP: 120/60  Pulse: 70  Resp: 14  Temp: 98.9 F (37.2 C)  TempSrc: Oral  SpO2: 97%  Weight: 152 lb 3.2 oz (69 kg)  Height: 5\' 3"  (1.6 m)   Body mass index is 26.96 kg/m.   Wt Readings from Last 3 Encounters:  06/07/17 152 lb 3.2 oz (69 kg)  05/08/17 147 lb 9.6 oz (67 kg)  04/05/17 150 lb (68 kg)   Physical Exam  Constitutional: She appears well-developed and well-nourished. No distress.  HENT:  Head: Normocephalic and atraumatic.  Mouth/Throat: Oropharynx is clear and moist. No oropharyngeal exudate.  Eyes: Conjunctivae are normal. Pupils are equal, round, and reactive to light.  Neck: Neck supple.  Cardiovascular: Normal rate and regular rhythm. Exam reveals no gallop and no friction rub.  No murmur heard. Pulmonary/Chest: Effort normal. No respiratory distress. She has no wheezes. She has no rales. She exhibits no tenderness.  Abdominal: Soft. Bowel sounds are normal. There is no tenderness. There is no guarding.  Musculoskeletal: She exhibits no edema.  Lymphadenopathy:    She has no cervical adenopathy.  Neurological: She is alert.  Oriented only to self  Skin: Skin is warm and dry. She is not diaphoretic.  Psychiatric: She has a normal mood and affect.    Labs reviewed: Recent Labs    10/17/16 10/31/16 01/23/17  NA 141 139 137  K 4.2 4.2 4.2  BUN 16 19 25*  CREATININE 1.3* 1.3* 1.3*   Recent  Labs    09/22/16  AST 15  ALT 11  ALKPHOS 105   Recent Labs    09/22/16 10/17/16  WBC 12.7 7.2  HGB 13.9 12.8  HCT 41  --   PLT 228 198   Lab Results  Component Value Date   TSH 2.72 08/04/2015   Lab Results  Component Value Date   HGBA1C 5.7 03/10/2014   Lab Results  Component Value Date   CHOL 297 (A) 04/01/2013   HDL 54 04/01/2013   LDLCALC 200 04/01/2013   TRIG 214 (A) 04/01/2013    Significant Diagnostic Results in last 30 days:   03/29/17 chest xray no acute cardiopulmonary findings, chronic appearing linear interstitial prominence and hyperlucency thought to be from chronic lung disease, no interstitial edema or pulmonary vascular congestion   Assessment/Plan  Vitamin d deficiency Continue vit d 2000 units daily.  Alzheimer's dementia Continue donepexzil 10 mg daily, supportive care, decline anticipated  Constipation Milk of magnesia as needed has been helpful  OA Continue calcium and vitamin d supplement with tylenol as needed for pain. Walker for mobility.  Fall precautions   Family/ staff Communication: reviewed care plan with patient and charge nurse.     Labs/tests ordered: cbc, cmp, vit d  Oneal GroutMAHIMA Elynn Patteson, MD Internal Medicine [facility] [address] [place], [place] [address] Cell Phone (Monday-Friday 8 am - 5 pm): [phone] On Call: [phone] and follow prompts after 5 pm and on weekends Office Phone: [phone] Office Fax: [phone]

## 2017-07-06 ENCOUNTER — Encounter: Payer: Self-pay | Admitting: Nurse Practitioner

## 2017-07-06 ENCOUNTER — Non-Acute Institutional Stay (SKILLED_NURSING_FACILITY): Payer: Medicare Other | Admitting: Nurse Practitioner

## 2017-07-06 DIAGNOSIS — F03C0 Unspecified dementia, severe, without behavioral disturbance, psychotic disturbance, mood disturbance, and anxiety: Secondary | ICD-10-CM

## 2017-07-06 DIAGNOSIS — I872 Venous insufficiency (chronic) (peripheral): Secondary | ICD-10-CM | POA: Diagnosis not present

## 2017-07-06 DIAGNOSIS — F0390 Unspecified dementia without behavioral disturbance: Secondary | ICD-10-CM

## 2017-07-06 NOTE — Progress Notes (Signed)
Location:  [facility] Room Number: 111 Place of Service:  SNF (31) Provider:  Dnya Hickle, Manxie NP  Oneal Grout, MD  Patient Care Team: Oneal Grout, MD as PCP - General (Internal Medicine) [place] [name], MD as Consulting Physician (Internal Medicine) Ailany Koren X, NP as Nurse Practitioner (Internal Medicine)  Extended Emergency Contact Information Primary Emergency Contact: [facility] Address: [address]          [place], [place] [address] [place] Home Phone: [phone] Relation: Son Secondary Emergency Contact: [name],[name] Address: [address]          [place], [place] [address] [place] Home Phone: [phone] Relation: Daughter  Code Status:  DNR Goals of care: Advanced Directive information Advanced Directives 07/06/2017  Does Patient Have a Medical Advance Directive? Yes  Type of Estate agent of [place];Living will;Out of facility DNR (pink MOST or yellow form)  Does patient want to make changes to medical advance directive? No - Patient declined  Copy of Healthcare Power of Attorney in Chart? Yes  Pre-existing out of facility DNR order (yellow form or pink MOST form) Yellow form placed in chart (order not valid for inpatient use)     Chief Complaint  Patient presents with  . Medical Management of Chronic Issues    F/U- osteoarthritis, dementia    HPI:  Pt is a 84 y.o. female seen today for medical management of chronic diseases.     The patient has history of dementia, resides in memory care unit [facility], self transfer, ambulates with walker, occasional incontinent of urine, on Donepezil 10mg  daily for memory. Chronic edema in BLE is stable.  Past Medical History:  Diagnosis Date  . Abnormal mammogram, unspecified 10/25/2006  . Abnormality of gait 03/02/2011  . Allergic rhinitis due to pollen 07/13/2006  . Blood in stool 06/29/2011  . Candidiasis 07/01/2012    . Cholelithiasis 08/02/2010  . Disturbance of skin sensation 11/25/2009  . Diverticulitis of colon (without mention of hemorrhage)(562.11) 10/28/2003  . Dizziness and giddiness 07/13/2006  . Edema 07/19/2006  . Lumbago 12/22/2009  . Memory loss 02/17/2010  . Osteoarthrosis, unspecified whether generalized or localized, unspecified site 07/19/2006  . Other and unspecified hyperlipidemia 03/28/2007  . Pain in joint, upper arm 10/25/2006  . Personal history of fall 12/22/2009  . Unspecified constipation 09/26/2007  . Unspecified essential hypertension 07/19/2006  . Unspecified vitamin D deficiency 08/19/2009  . Uterine prolapse without mention of vaginal wall prolapse 06/29/2011   Past Surgical History:  Procedure Laterality Date  . CATARACT EXTRACTION W/ INTRAOCULAR LENS  IMPLANT, BILATERAL Bilateral 1999    Allergies  Allergen Reactions  . Lopid [Gemfibrozil]   . Sulfa Antibiotics   . Zocor [Simvastatin]     Outpatient Encounter Medications as of 07/06/2017  Medication Sig  . acetaminophen (TYLENOL) 325 MG tablet Take 650 mg by mouth every 6 (six) hours as needed for mild pain.   . calcium carbonate (TUMS) 500 MG chewable tablet Chew 3 tablets by mouth daily.   . Cholecalciferol (VITAMIN D) 1000 UNITS capsule Take 2,000 Units by mouth daily.   Marland Kitchen donepezil (ARICEPT) 10 MG tablet One daily to help preserve memory.  . [DISCONTINUED] magnesium hydroxide (MILK OF MAGNESIA) 400 MG/5ML suspension Take 30 mLs by mouth as needed for mild constipation.   No facility-administered encounter medications on file as of 07/06/2017.    ROS was provided with assistance of staff Review of Systems  Constitutional: Negative for activity change, appetite change, chills, diaphoresis, fatigue and fever.  HENT: Positive for hearing loss. Negative for congestion.   Eyes: Negative for visual disturbance.  Respiratory: Negative for cough, chest tightness, shortness of breath and wheezing.   Cardiovascular: Positive for  leg swelling. Negative for chest pain and palpitations.  Gastrointestinal: Negative for abdominal distention, abdominal pain, constipation, diarrhea, nausea and vomiting.  Genitourinary: Negative for difficulty urinating, dysuria and urgency.       Incontinent of urine.   Musculoskeletal: Positive for gait problem.  Skin: Negative for color change and pallor.  Neurological: Negative for facial asymmetry, speech difficulty and weakness.       Dementia  Psychiatric/Behavioral: Positive for confusion. Negative for agitation, behavioral problems, hallucinations and sleep disturbance. The patient is not nervous/anxious.     Immunization History  Administered Date(s) Administered  . Influenza Whole 01/16/2012, 02/20/2013  . Influenza-Unspecified 02/19/2014, 01/05/2015, 02/01/2016  . PPD Test 08/23/2010, 08/04/2015  . Pneumococcal Polysaccharide-23 06/24/1998  . Td 05/02/1974   Pertinent  Health Maintenance Due  Topic Date Due  . PNA vac Low Risk Adult (2 of 2 - PCV13) 06/24/1999  . INFLUENZA VACCINE  11/15/2016  . DEXA SCAN  10/13/2017 (Originally 03/19/1986)   Fall Risk  12/15/2016 08/06/2015 08/06/2015 11/19/2014 11/19/2014  Falls in the past year? No No No Yes No  Number falls in past yr: - - - 1 -  Injury with Fall? - - - No -   Functional Status Survey:    Vitals:   07/06/17 1406  BP: 130/70  Pulse: 68  Resp: 18  Temp: 98.4 F (36.9 C)  Weight: 153 lb 9.6 oz (69.7 kg)  Height: 5\' 3"  (1.6 m)   Body mass index is 27.21 kg/m. Physical Exam  Constitutional: She appears well-developed and well-nourished.  HENT:  Head: Normocephalic and atraumatic.  Eyes: Pupils are equal, round, and reactive to light. Conjunctivae and EOM are normal.  Neck: Normal range of motion. Neck supple. No JVD present. No thyromegaly present.  Cardiovascular: Normal rate and regular rhythm.  No murmur heard. Pulmonary/Chest: Effort normal. She has no wheezes. She has rales.  Bibasilar rales.     Abdominal: Soft. Bowel sounds are normal. She exhibits no distension. There is no tenderness.  Musculoskeletal: She exhibits edema.  Self transfer, ambulates with walker, 1+ edema LLE, trace edema RLE  Neurological: She is alert. She exhibits normal muscle tone. Coordination normal.  Oriented to self only.   Skin: Skin is warm and dry.  Psychiatric: She has a normal mood and affect. Her behavior is normal.    Labs reviewed: Recent Labs    10/17/16 10/31/16 01/23/17  NA 141 139 137  K 4.2 4.2 4.2  BUN 16 19 25*  CREATININE 1.3* 1.3* 1.3*   Recent Labs    09/22/16  AST 15  ALT 11  ALKPHOS 105   Recent Labs    09/22/16 10/17/16  WBC 12.7 7.2  HGB 13.9 12.8  HCT 41  --   PLT 228 198   Lab Results  Component Value Date   TSH 2.72 08/04/2015   Lab Results  Component Value Date   HGBA1C 5.7 03/10/2014   Lab Results  Component Value Date   CHOL 297 (A) 04/01/2013   HDL 54 04/01/2013   LDLCALC 200 04/01/2013   TRIG 214 (A) 04/01/2013    Significant Diagnostic Results in last 30 days:  No results found.  Assessment/Plan Advanced dementia dementia, resides in memory care  unit [facility], self transfer, ambulates with walker, occasional incontinent of urine, continue  Donepezil 10mg  daily for memory.   Venous insufficiency of both lower extremities Chronic edema in BLE is stable. LLE 1+, RLE trace. No respiratory symptoms associated with peripheral edema. Observe.       Family/ staff Communication: plan of care reviewed with the patient and charge nurse.   Labs/tests ordered:  none  Time spend 25 minutes.

## 2017-07-06 NOTE — Assessment & Plan Note (Signed)
Chronic edema in BLE is stable. LLE 1+, RLE trace. No respiratory symptoms associated with peripheral edema. Observe.

## 2017-07-06 NOTE — Assessment & Plan Note (Signed)
dementia, [facility], self [facility], ambulates with walker, occasional incontinent of urine, continue  Donepezil 10mg  daily for memory.

## 2017-07-24 LAB — VITAMIN D 25 HYDROXY (VIT D DEFICIENCY, FRACTURES): Vit D, 25-Hydroxy: 51

## 2017-08-07 ENCOUNTER — Other Ambulatory Visit: Payer: Self-pay | Admitting: *Deleted

## 2017-08-08 ENCOUNTER — Non-Acute Institutional Stay (SKILLED_NURSING_FACILITY): Payer: Medicare Other | Admitting: Nurse Practitioner

## 2017-08-08 ENCOUNTER — Encounter: Payer: Self-pay | Admitting: Nurse Practitioner

## 2017-08-08 DIAGNOSIS — F0390 Unspecified dementia without behavioral disturbance: Secondary | ICD-10-CM | POA: Diagnosis not present

## 2017-08-08 DIAGNOSIS — M150 Primary generalized (osteo)arthritis: Secondary | ICD-10-CM

## 2017-08-08 DIAGNOSIS — M159 Polyosteoarthritis, unspecified: Secondary | ICD-10-CM

## 2017-08-08 DIAGNOSIS — F03C0 Unspecified dementia, severe, without behavioral disturbance, psychotic disturbance, mood disturbance, and anxiety: Secondary | ICD-10-CM

## 2017-08-08 NOTE — Assessment & Plan Note (Signed)
Osteoarthritis, stable, on Ca and vit D supplement, no recent fractures. Last Vit D level 51 07/25/17

## 2017-08-08 NOTE — Progress Notes (Signed)
Location:  [facility] Room Number: 111 Place of Service:  SNF (31) Provider:  Therese Rocco, ManXie  NP   Oneal Grout, MD  Patient Care Team: Oneal Grout, MD as PCP - General (Internal Medicine) [place] [name], MD as Consulting Physician (Internal Medicine) Natalye Kott X, NP as Nurse Practitioner (Internal Medicine)  Extended Emergency Contact Information Primary Emergency Contact: [facility] Address: [address]          [place], [place] [address] [place] Home Phone: [phone] Relation: Son Secondary Emergency Contact: [name],[name] Address: [address]          [place], [place] [address] [place] Home Phone: [phone] Relation: Daughter  Code Status:  DNR Goals of care: Advanced Directive information Advanced Directives 08/08/2017  Does Patient Have a Medical Advance Directive? Yes  Type of Estate agent of [place];Living will;Out of facility DNR (pink MOST or yellow form)  Does patient want to make changes to medical advance directive? No - Patient declined  Copy of Healthcare Power of Attorney in Chart? Yes  Pre-existing out of facility DNR order (yellow form or pink MOST form) Yellow form placed in chart (order not valid for inpatient use)     Chief Complaint  Patient presents with  . Medical Management of Chronic Issues    F/u- dementia, osteoarthritis,     HPI:  Pt is a 84 y.o. female seen today for medical management of chronic diseases.    The patient has history of dementia, resides in Memory Care Unit [facility], taking Donepezil 10mg  daily for memory. She transfers self, ambulates with walker for a short distance, w/c to go further. Osteoarthritis, stable, on Ca and vit D supplement, no recent fractures.   Past Medical History:  Diagnosis Date  . Abnormal mammogram, unspecified 10/25/2006  . Abnormality of gait 03/02/2011  . Allergic rhinitis due to pollen  07/13/2006  . Blood in stool 06/29/2011  . Candidiasis 07/01/2012  . Cholelithiasis 08/02/2010  . Disturbance of skin sensation 11/25/2009  . Diverticulitis of colon (without mention of hemorrhage)(562.11) 10/28/2003  . Dizziness and giddiness 07/13/2006  . Edema 07/19/2006  . Lumbago 12/22/2009  . Memory loss 02/17/2010  . Osteoarthrosis, unspecified whether generalized or localized, unspecified site 07/19/2006  . Other and unspecified hyperlipidemia 03/28/2007  . Pain in joint, upper arm 10/25/2006  . Personal history of fall 12/22/2009  . Unspecified constipation 09/26/2007  . Unspecified essential hypertension 07/19/2006  . Unspecified vitamin D deficiency 08/19/2009  . Uterine prolapse without mention of vaginal wall prolapse 06/29/2011   Past Surgical History:  Procedure Laterality Date  . CATARACT EXTRACTION W/ INTRAOCULAR LENS  IMPLANT, BILATERAL Bilateral 1999    Allergies  Allergen Reactions  . Lopid [Gemfibrozil]   . Sulfa Antibiotics   . Zocor [Simvastatin]     Outpatient Encounter Medications as of 08/08/2017  Medication Sig  . acetaminophen (TYLENOL) 325 MG tablet Take 650 mg by mouth every 6 (six) hours as needed for mild pain.   . calcium carbonate (TUMS) 500 MG chewable tablet Chew 3 tablets by mouth daily.   . Cholecalciferol (VITAMIN D) 1000 UNITS capsule Take 2,000 Units by mouth daily.   Marland Kitchen donepezil (ARICEPT) 10 MG tablet One daily to help preserve memory.   No facility-administered encounter medications on file as of 08/08/2017.     Review of Systems  Constitutional: Negative for activity change, appetite change, chills, diaphoresis, fatigue and fever.  HENT:  Positive for hearing loss. Negative for congestion and trouble swallowing.   Eyes: Negative for visual disturbance.  Respiratory: Negative for cough, shortness of breath and wheezing.   Cardiovascular: Positive for leg swelling. Negative for chest pain and palpitations.  Gastrointestinal: Negative for abdominal  distention, abdominal pain, constipation, diarrhea, nausea and vomiting.  Genitourinary: Negative for difficulty urinating, dysuria and urgency.       Incontinent of urine.   Musculoskeletal: Positive for arthralgias and gait problem.  Skin: Negative for color change and pallor.  Neurological: Negative for speech difficulty, weakness and headaches.       [place]  Psychiatric/Behavioral: Positive for confusion. Negative for agitation, behavioral problems and hallucinations. The patient is not nervous/anxious.     Immunization History  Administered Date(s) Administered  . Influenza Whole 01/16/2012, 02/20/2013  . Influenza-Unspecified 02/19/2014, 01/05/2015, 02/01/2016  . PPD Test 08/23/2010, 08/04/2015  . Pneumococcal Polysaccharide-23 06/24/1998  . Td 05/02/1974   Pertinent  Health Maintenance Due  Topic Date Due  . PNA vac Low Risk Adult (2 of 2 - PCV13) 06/24/1999  . DEXA SCAN  10/13/2017 (Originally 03/19/1986)  . INFLUENZA VACCINE  11/15/2017   Fall Risk  12/15/2016 08/06/2015 08/06/2015 11/19/2014 11/19/2014  Falls in the past year? No No No Yes No  Number falls in past yr: - - - 1 -  Injury with Fall? - - - No -   Functional Status Survey:    Vitals:   08/08/17 1223  BP: (!) 100/52  Pulse: 71  Resp: 18  Temp: 98.4 F (36.9 C)  Weight: 155 lb 6.4 oz (70.5 kg)  Height: 5\' 3"  (1.6 m)   Body mass index is 27.53 kg/m. Physical Exam  Constitutional: She appears well-developed and well-nourished.  HENT:  Head: Normocephalic and atraumatic.  Eyes: Pupils are equal, round, and reactive to light. EOM are normal.  Neck: Normal range of motion. Neck supple. No thyromegaly present.  Abdominal: Soft. Bowel sounds are normal. She exhibits no distension. There is no tenderness.  Musculoskeletal: She exhibits edema.  Transfers self, ambulates with walker for a short distance, w/c to go further. Trace edema in ankles has no change.  Neurological: She is alert. No cranial nerve  deficit. She exhibits normal muscle tone. Coordination normal.  Oriented to self and her room on unit.   Skin: Skin is warm and dry. No rash noted.  Psychiatric: She has a normal mood and affect. Her behavior is normal.    Labs reviewed: Recent Labs    10/17/16 10/31/16 01/23/17  NA 141 139 137  K 4.2 4.2 4.2  BUN 16 19 25*  CREATININE 1.3* 1.3* 1.3*   Recent Labs    09/22/16  AST 15  ALT 11  ALKPHOS 105   Recent Labs    09/22/16 10/17/16  WBC 12.7 7.2  HGB 13.9 12.8  HCT 41  --   PLT 228 198   Lab Results  Component Value Date   TSH 2.72 08/04/2015   Lab Results  Component Value Date   HGBA1C 5.7 03/10/2014   Lab Results  Component Value Date   CHOL 297 (A) 04/01/2013   HDL 54 04/01/2013   LDLCALC 200 04/01/2013   TRIG 214 (A) 04/01/2013    Significant Diagnostic Results in last 30 days:  No results found.  Assessment/Plan Advanced dementia dementia, [facility], continue  Donepezil 10mg  daily for memory. She transfers self, ambulates with walker for a short distance, w/c to go further.  Osteoarthritis (arthritis due to wear and tear of joints) Osteoarthritis, stable, on Ca and vit D supplement, no recent fractures. Last Vit D level 51 07/25/17      Family/ staff Communication: plan of care reviewed with the patient and charge nurse. Tdap prescription was provided with assistance of staff  Labs/tests ordered:  none  Time spend 25 minutes.

## 2017-08-08 NOTE — Assessment & Plan Note (Signed)
dementia, [facility], continue  Donepezil 10mg  daily for memory. She transfers self, ambulates with walker for a short distance, w/c to go further.

## 2017-08-31 ENCOUNTER — Non-Acute Institutional Stay (SKILLED_NURSING_FACILITY): Payer: Medicare Other | Admitting: Nurse Practitioner

## 2017-08-31 ENCOUNTER — Encounter: Payer: Self-pay | Admitting: Nurse Practitioner

## 2017-08-31 DIAGNOSIS — K5901 Slow transit constipation: Secondary | ICD-10-CM

## 2017-08-31 DIAGNOSIS — I872 Venous insufficiency (chronic) (peripheral): Secondary | ICD-10-CM

## 2017-08-31 DIAGNOSIS — F03C0 Unspecified dementia, severe, without behavioral disturbance, psychotic disturbance, mood disturbance, and anxiety: Secondary | ICD-10-CM

## 2017-08-31 DIAGNOSIS — K649 Unspecified hemorrhoids: Secondary | ICD-10-CM

## 2017-08-31 DIAGNOSIS — F0390 Unspecified dementia without behavioral disturbance: Secondary | ICD-10-CM

## 2017-08-31 DIAGNOSIS — K921 Melena: Secondary | ICD-10-CM | POA: Insufficient documentation

## 2017-08-31 NOTE — Assessment & Plan Note (Signed)
swelling in BLE, L>R, Hx of edema BLE, the patient denied cough, chest pressure, palpitation, or sputum production. No s/s of cellulitis in BLE. Will weight daily x 5 days.

## 2017-08-31 NOTE — Assessment & Plan Note (Signed)
Also reported some blood in stool 08/30/17, no further bleeding noted today. The patient denied pain in anal rectal area, denied abd pain or discomfort, fatigue, weakness, nausea, vomiting, constipation, or diarrhea. Guaiac stools x3, VS q shift x 72hrs, update CBC CMP

## 2017-08-31 NOTE — Assessment & Plan Note (Signed)
HPI was provided with assistance of staff due to her advanced dementia. She resides in memory care unit [facility], one person assist for transfer, ambulates with walker, continue  Donepezil for memory.

## 2017-08-31 NOTE — Progress Notes (Signed)
Location:  [facility] Room Number: 111 Place of Service:  SNF (31) Provider:  Duvan Mousel, ManXie  NP  Oneal Grout, MD  Patient Care Team: Oneal Grout, MD as PCP - General (Internal Medicine) [place] [name], MD as Consulting Physician (Internal Medicine) Brody Kump X, NP as Nurse Practitioner (Internal Medicine)  Extended Emergency Contact Information Primary Emergency Contact: [facility] Address: [address]          [place], [place] [address] [place] Home Phone: [phone] Relation: Son Secondary Emergency Contact: [name],[name] Address: [address]          [place], [place] [address] [place] Home Phone: [phone] Relation: Daughter  Code Status:  DNR Goals of care: Advanced Directive information Advanced Directives 08/31/2017  Does Patient Have a Medical Advance Directive? Yes  Type of Estate agent of [place];Living will;Out of facility DNR (pink MOST or yellow form)  Does patient want to make changes to medical advance directive? No - Patient declined  Copy of Healthcare Power of Attorney in Chart? Yes  Pre-existing out of facility DNR order (yellow form or pink MOST form) Yellow form placed in chart (order not valid for inpatient use)     Chief Complaint  Patient presents with  . Acute Visit    (L) leg swollen, blood in stool    HPI:  Pt is a 84 y.o. female seen today for an acute visit for reported swelling in BLE, L>R, Hx of edema BLE, the patient denied cough, chest pressure, palpitation, or sputum production. No s/s of cellulitis in BLE. Also reported some blood in stool 08/30/17, no further bleeding noted today. The patient denied pain in anal rectal area, denied abd pain or discomfort, fatigue, weakness, nausea, vomiting, constipation, or diarrhea. HPI was provided with assistance of staff due to her advanced dementia. She resides in memory care unit [facility],  one person assist for transfer, ambulates with walker, taking Donepezil for memory.    Past Medical History:  Diagnosis Date  . Abnormal mammogram, unspecified 10/25/2006  . Abnormality of gait 03/02/2011  . Allergic rhinitis due to pollen 07/13/2006  . Blood in stool 06/29/2011  . Candidiasis 07/01/2012  . Cholelithiasis 08/02/2010  . Disturbance of skin sensation 11/25/2009  . Diverticulitis of colon (without mention of hemorrhage)(562.11) 10/28/2003  . Dizziness and giddiness 07/13/2006  . Edema 07/19/2006  . Lumbago 12/22/2009  . Memory loss 02/17/2010  . Osteoarthrosis, unspecified whether generalized or localized, unspecified site 07/19/2006  . Other and unspecified hyperlipidemia 03/28/2007  . Pain in joint, upper arm 10/25/2006  . Personal history of fall 12/22/2009  . Unspecified constipation 09/26/2007  . Unspecified essential hypertension 07/19/2006  . Unspecified vitamin D deficiency 08/19/2009  . Uterine prolapse without mention of vaginal wall prolapse 06/29/2011   Past Surgical History:  Procedure Laterality Date  . CATARACT EXTRACTION W/ INTRAOCULAR LENS  IMPLANT, BILATERAL Bilateral 1999    Allergies  Allergen Reactions  . Lopid [Gemfibrozil]   . Sulfa Antibiotics   . Zocor [Simvastatin]     Outpatient Encounter Medications as of 08/31/2017  Medication Sig  . acetaminophen (TYLENOL) 325 MG tablet Take 650 mg by mouth every 6 (six) hours as needed for mild pain.   . calcium carbonate (TUMS) 500 MG chewable tablet Chew 3 tablets by mouth daily.   . Cholecalciferol (VITAMIN D) 1000 UNITS capsule Take 2,000 Units by mouth daily.   Marland Kitchen donepezil (ARICEPT) 10 MG  tablet One daily to help preserve memory.   No facility-administered encounter medications on file as of 08/31/2017.    ROS was provided with assistance of staff Review of Systems  Constitutional: Negative for activity change, appetite change, chills, diaphoresis, fatigue and fever.  HENT: Positive for hearing loss. Negative  for congestion, trouble swallowing and voice change.   Respiratory: Negative for cough and shortness of breath.   Cardiovascular: Positive for leg swelling. Negative for chest pain and palpitations.  Gastrointestinal: Positive for blood in stool. Negative for abdominal distention, abdominal pain, anal bleeding, constipation, diarrhea, nausea and vomiting.  Genitourinary: Negative for difficulty urinating, dysuria and urgency.  Musculoskeletal: Positive for gait problem.  Skin: Negative for color change and pallor.  Neurological: Negative for dizziness, speech difficulty, weakness and light-headedness.       Dementia,   Psychiatric/Behavioral: Positive for confusion. Negative for agitation, behavioral problems and sleep disturbance. The patient is not nervous/anxious.     Immunization History  Administered Date(s) Administered  . Influenza Whole 01/16/2012, 02/20/2013  . Influenza-Unspecified 02/19/2014, 01/05/2015, 02/01/2016  . PPD Test 08/23/2010, 08/04/2015  . Pneumococcal Polysaccharide-23 06/24/1998  . Td 05/02/1974   Pertinent  Health Maintenance Due  Topic Date Due  . PNA vac Low Risk Adult (2 of 2 - PCV13) 06/24/1999  . DEXA SCAN  10/13/2017 (Originally 03/19/1986)  . INFLUENZA VACCINE  11/15/2017   Fall Risk  12/15/2016 08/06/2015 08/06/2015 11/19/2014 11/19/2014  Falls in the past year? No No No Yes No  Number falls in past yr: - - - 1 -  Injury with Fall? - - - No -   Functional Status Survey:    Vitals:   08/31/17 1256  BP: (!) 144/58  Pulse: 68  Resp: 20  Temp: 98.7 F (37.1 C)  SpO2: 98%  Weight: 160 lb 3.2 oz (72.7 kg)  Height:  (1.6 m)   Body mass index is 28.38 kg/m. Physical Exam  Constitutional: She appears well-developed and well-nourished.  HENT:  Head: Normocephalic and atraumatic.  Eyes: Pupils are equal, round, and reactive to light. EOM are normal.  Neck: Normal range of motion. Neck supple. No JVD present. No thyromegaly present.    Cardiovascular: Normal rate and regular rhythm.  No murmur heard. Pulmonary/Chest: Effort normal. She has no wheezes. She has rales.  Posterior bibasilar rales.   Abdominal: Soft. Bowel sounds are normal. She exhibits no distension. There is no tenderness. There is no rebound and no guarding.  External hemorrhoids noted, hard stool noted on digital exam.   Musculoskeletal: She exhibits edema.  1+ edema BLE, L>R, one person assist transfer, ambulate with walker.   Neurological: She is alert. No cranial nerve deficit. She exhibits normal muscle tone. Coordination normal.  Oriented to self and her room on unit.   Skin: Skin is warm and dry. No erythema.  Psychiatric: She has a normal mood and affect. Her behavior is normal.    Labs reviewed: Recent Labs    10/17/16 10/31/16 01/23/17  NA 141 139 137  K 4.2 4.2 4.2  BUN 16 19 25*  CREATININE 1.3* 1.3* 1.3*   Recent Labs    09/22/16  AST 15  ALT 11  ALKPHOS 105   Recent Labs    09/22/16 10/17/16  WBC 12.7 7.2  HGB 13.9 12.8  HCT 41  --   PLT 228 198   Lab Results  Component Value Date   TSH 2.72 08/04/2015   Lab Results  Component Value Date  HGBA1C 5.7 03/10/2014   Lab Results  Component Value Date   CHOL 297 (A) 04/01/2013   HDL 54 04/01/2013   LDLCALC 200 04/01/2013   TRIG 214 (A) 04/01/2013    Significant Diagnostic Results in last 30 days:  No results found.  Assessment/Plan Venous insufficiency of both lower extremities swelling in BLE, L>R, Hx of edema BLE, the patient denied cough, chest pressure, palpitation, or sputum production. No s/s of cellulitis in BLE. Will weight daily x 5 days.   Blood in stool Also reported some blood in stool 08/30/17, no further bleeding noted today. The patient denied pain in anal rectal area, denied abd pain or discomfort, fatigue, weakness, nausea, vomiting, constipation, or diarrhea. Guaiac stools x3, VS q shift x 72hrs, update CBC CMP  Advanced dementia HPI was  provided with assistance of staff due to her advanced dementia. She resides in [facility], one person assist for transfer, ambulates with walker, continue  Donepezil for memory.    Hemorrhoids External 12, 5, 7 o'clock, no s/s of bleeding, irritation, or c/o pain or itching, avoid constipation.   Slow transit constipation MiraLax daily, observe.      Family/ staff Communication: plan of care reviewed with the patient and charge nurse.   Labs/tests ordered:  Guaiac stools x3, CBC, CMP   Time spend 25 minutes.

## 2017-08-31 NOTE — Assessment & Plan Note (Signed)
MiraLax daily, observe.  

## 2017-08-31 NOTE — Assessment & Plan Note (Signed)
External 12, 5, 7 o'clock, no s/s of bleeding, irritation, or c/o pain or itching, avoid constipation.

## 2017-09-18 ENCOUNTER — Non-Acute Institutional Stay (SKILLED_NURSING_FACILITY): Payer: Medicare Other | Admitting: Internal Medicine

## 2017-09-18 ENCOUNTER — Encounter: Payer: Self-pay | Admitting: Internal Medicine

## 2017-09-18 DIAGNOSIS — R269 Unspecified abnormalities of gait and mobility: Secondary | ICD-10-CM

## 2017-09-18 DIAGNOSIS — F03C0 Unspecified dementia, severe, without behavioral disturbance, psychotic disturbance, mood disturbance, and anxiety: Secondary | ICD-10-CM

## 2017-09-18 DIAGNOSIS — M150 Primary generalized (osteo)arthritis: Secondary | ICD-10-CM | POA: Diagnosis not present

## 2017-09-18 DIAGNOSIS — M159 Polyosteoarthritis, unspecified: Secondary | ICD-10-CM

## 2017-09-18 DIAGNOSIS — I10 Essential (primary) hypertension: Secondary | ICD-10-CM

## 2017-09-18 DIAGNOSIS — I872 Venous insufficiency (chronic) (peripheral): Secondary | ICD-10-CM

## 2017-09-18 DIAGNOSIS — F0390 Unspecified dementia without behavioral disturbance: Secondary | ICD-10-CM

## 2017-09-18 DIAGNOSIS — K5901 Slow transit constipation: Secondary | ICD-10-CM | POA: Diagnosis not present

## 2017-09-18 NOTE — Progress Notes (Signed)
Location:  [facility] Room Number: 111 Place of Service:  SNF [phone]) Provider:  Oneal Grout MD  Oneal Grout, MD  Patient Care Team: Oneal Grout, MD as PCP - General (Internal Medicine) [place] [name], MD as Consulting Physician (Internal Medicine) Mast, Man X, NP as Nurse Practitioner (Internal Medicine)  Extended Emergency Contact Information Primary Emergency Contact: [facility] Address: [address]          [place], [place] [address] [place] Home Phone: [phone] Relation: Son Secondary Emergency Contact: [name],[name] Address: [address]          [place], [place] [address] [place] Home Phone: [phone] Relation: Daughter  Code Status:  DNR  Goals of care: Advanced Directive information Advanced Directives 09/18/2017  Does Patient Have a Medical Advance Directive? Yes  Type of Estate agent of [place];Living will;Out of facility DNR (pink MOST or yellow form)  Does patient want to make changes to medical advance directive? No - Patient declined  Copy of Healthcare Power of Attorney in Chart? Yes  Pre-existing out of facility DNR order (yellow form or pink MOST form) Yellow form placed in chart (order not valid for inpatient use)     Chief Complaint  Patient presents with  . Medical Management of Chronic Issues    Routine Visit     HPI:  Pt is a 84 y.o. female seen today for medical management of chronic diseases. She resides in memory care unit. She has been at her baseline per nursing. She does not participate in HPI and ROS. No fall reported after 09/11/17. No apparent injury from the fall, ambulates with her walker. She feeds her self. She is continent with bowel and bladder. No acute behavior concern reported by nursing. She is tolerating donepezil and miralax well.    Past Medical History:  Diagnosis Date  . Abnormal mammogram, unspecified  10/25/2006  . Abnormality of gait 03/02/2011  . Allergic rhinitis due to pollen 07/13/2006  . Blood in stool 06/29/2011  . Candidiasis 07/01/2012  . Cholelithiasis 08/02/2010  . Disturbance of skin sensation 11/25/2009  . Diverticulitis of colon (without mention of hemorrhage)(562.11) 10/28/2003  . Dizziness and giddiness 07/13/2006  . Edema 07/19/2006  . Lumbago 12/22/2009  . Memory loss 02/17/2010  . Osteoarthrosis, unspecified whether generalized or localized, unspecified site 07/19/2006  . Other and unspecified hyperlipidemia 03/28/2007  . Pain in joint, upper arm 10/25/2006  . Personal history of fall 12/22/2009  . Unspecified constipation 09/26/2007  . Unspecified essential hypertension 07/19/2006  . Unspecified vitamin D deficiency 08/19/2009  . Uterine prolapse without mention of vaginal wall prolapse 06/29/2011   Past Surgical History:  Procedure Laterality Date  . CATARACT EXTRACTION W/ INTRAOCULAR LENS  IMPLANT, BILATERAL Bilateral 1999    Allergies  Allergen Reactions  . Lopid [Gemfibrozil]   . Sulfa Antibiotics   . Zocor [Simvastatin]     Outpatient Encounter Medications as of 09/18/2017  Medication Sig  . acetaminophen (TYLENOL) 325 MG tablet Take 650 mg by mouth every 6 (six) hours as needed for mild pain.   . calcium carbonate (TUMS) 500 MG chewable tablet Chew 3 tablets by mouth daily.   . Cholecalciferol (VITAMIN D) 1000 UNITS capsule Take 2,000 Units by mouth daily.   Marland Kitchen donepezil (ARICEPT) 10 MG tablet One daily to help preserve memory.  . magnesium hydroxide (MILK OF MAGNESIA) 400 MG/5ML suspension Take 30 mLs by mouth as needed for  mild constipation.  . polyethylene glycol (MIRALAX / GLYCOLAX) packet Take 17 g by mouth daily.   No facility-administered encounter medications on file as of 09/18/2017.     Review of Systems  Unable to perform ROS: Dementia    Immunization History  Administered Date(s) Administered  . Influenza Whole 01/16/2012, 02/20/2013  .  Influenza-Unspecified 02/19/2014, 01/05/2015, 02/01/2016  . PPD Test 08/23/2010, 08/04/2015  . Pneumococcal Polysaccharide-23 06/24/1998  . Td 05/02/1974   Pertinent  Health Maintenance Due  Topic Date Due  . PNA vac Low Risk Adult (2 of 2 - PCV13) 06/24/1999  . DEXA SCAN  10/13/2017 (Originally 03/19/1986)  . INFLUENZA VACCINE  11/15/2017   Fall Risk  12/15/2016 08/06/2015 08/06/2015 11/19/2014 11/19/2014  Falls in the past year? No No No Yes No  Number falls in past yr: - - - 1 -  Injury with Fall? - - - No -   Functional Status Survey:    Vitals:   09/18/17 1321  BP: 131/81  Pulse: 77  Resp: 16  Temp: 98.5 F (36.9 C)  TempSrc: Oral  SpO2: 96%  Weight: 159 lb 3.2 oz (72.2 kg)  Height: 5\' 3"  (1.6 m)   Body mass index is 28.2 kg/m.   Wt Readings from Last 3 Encounters:  09/18/17 159 lb 3.2 oz (72.2 kg)  08/31/17 160 lb 3.2 oz (72.7 kg)  08/08/17 155 lb 6.4 oz (70.5 kg)   Physical Exam  Constitutional:  Overweight elderly female in no acute distress  HENT:  Head: Normocephalic and atraumatic.  Right Ear: External ear normal.  Left Ear: External ear normal.  Mouth/Throat: Oropharynx is clear and moist.  Eyes: Pupils are equal, round, and reactive to light. Conjunctivae are normal. Right eye exhibits no discharge. Left eye exhibits no discharge.  Neck: Normal range of motion. Neck supple.  Cardiovascular: Normal rate and regular rhythm.  Pulmonary/Chest: Effort normal. She has no wheezes. She has no rales.  Decreased air movement to lung bases  Abdominal: Soft. Bowel sounds are normal. There is no tenderness.  Musculoskeletal: She exhibits edema.  Can move all 4 extremities, unsteady gait, uses walker for ambulation  Lymphadenopathy:    She has no cervical adenopathy.  Neurological: She is alert.  Oriented to self only  Skin: Skin is warm and dry. She is not diaphoretic.  Psychiatric:  Pleasantly confused    Labs reviewed: Recent Labs    10/17/16 10/31/16  01/23/17  NA 141 139 137  K 4.2 4.2 4.2  BUN 16 19 25*  CREATININE 1.3* 1.3* 1.3*   Recent Labs    09/22/16  AST 15  ALT 11  ALKPHOS 105   Recent Labs    09/22/16 10/17/16  WBC 12.7 7.2  HGB 13.9 12.8  HCT 41  --   PLT 228 198   Lab Results  Component Value Date   TSH 2.72 08/04/2015   Lab Results  Component Value Date   HGBA1C 5.7 03/10/2014   Lab Results  Component Value Date   CHOL 297 (A) 04/01/2013   HDL 54 04/01/2013   LDLCALC 200 04/01/2013   TRIG 214 (A) 04/01/2013    Significant Diagnostic Results in last 30 days:  No results found.  Assessment/Plan  1. Essential hypertension 2-3 readings with SBP in 140s. With her age, dementia and unsteady gait along with recent fall, avoid overcorrection of BP, consider antihypertensive if BP on average is >150/90. Check cmp  2. Venous insufficiency of both lower extremities Supportive care,  to keep legs elevated at rest  3. Slow transit constipation Continue miralax, maintain hydration  4. Advanced dementia Continue donepezil and supportive care. Check cbc and TSH  5. Primary osteoarthritis involving multiple joints Tylenol as needed for pain  6. Abnormality of gait Rolling Walker and guard assist with transfer and ambulation, she remains a fall risk.     Family/ staff Communication: reviewed care plan with patient and charge nurse.    Labs/tests ordered: cbc, CMP, TSH   Oneal Grout, MD Internal Medicine [facility] [address] [place], [place] [address] Cell Phone (Monday-Friday 8 am - 5 pm): [phone] On Call: [phone] and follow prompts after 5 pm and on weekends Office Phone: [phone] Office Fax: [phone]

## 2017-09-20 LAB — BASIC METABOLIC PANEL
BUN: 24 — AB (ref 4–21)
Creatinine: 1.2 — AB (ref 0.5–1.1)
GLUCOSE: 91
POTASSIUM: 4.1 (ref 3.4–5.3)
Sodium: 140 (ref 137–147)

## 2017-09-20 LAB — HEPATIC FUNCTION PANEL
ALT: 22 (ref 7–35)
AST: 23 (ref 13–35)
Alkaline Phosphatase: 104 (ref 25–125)
BILIRUBIN, TOTAL: 0.6

## 2017-09-20 LAB — CBC AND DIFFERENTIAL
HEMATOCRIT: 43 (ref 36–46)
HEMOGLOBIN: 14.6 (ref 12.0–16.0)
Platelets: 246 (ref 150–399)
WBC: 7.3

## 2017-09-20 LAB — TSH: TSH: 2.42 (ref 0.41–5.90)

## 2017-09-21 LAB — CMP 10231
Albumin: 3.7
CALCIUM: 9.2
CO2: 31
Chloride: 103
EGFR (Non-African Amer.): 37
GLOBULIN: 2.6
Total Protein: 6.3

## 2017-10-16 ENCOUNTER — Non-Acute Institutional Stay (SKILLED_NURSING_FACILITY): Payer: Medicare Other | Admitting: Nurse Practitioner

## 2017-10-16 ENCOUNTER — Encounter: Payer: Self-pay | Admitting: Nurse Practitioner

## 2017-10-16 DIAGNOSIS — K5901 Slow transit constipation: Secondary | ICD-10-CM

## 2017-10-16 DIAGNOSIS — B351 Tinea unguium: Secondary | ICD-10-CM

## 2017-10-16 DIAGNOSIS — F0390 Unspecified dementia without behavioral disturbance: Secondary | ICD-10-CM | POA: Diagnosis not present

## 2017-10-16 DIAGNOSIS — F03C0 Unspecified dementia, severe, without behavioral disturbance, psychotic disturbance, mood disturbance, and anxiety: Secondary | ICD-10-CM

## 2017-10-16 DIAGNOSIS — I872 Venous insufficiency (chronic) (peripheral): Secondary | ICD-10-CM

## 2017-10-16 NOTE — Assessment & Plan Note (Signed)
The left 1st and 2nd toenails, thickened and long, soaking and trim needed, the skin at the edge of toenail irritation should subsided after toenails trimmed. Observe for possible infection.

## 2017-10-16 NOTE — Assessment & Plan Note (Signed)
Stable, continue MiraLax daily, prn MOM

## 2017-10-16 NOTE — Progress Notes (Signed)
Location:   [facility] Room Number: 111 Place of Service:  SNF (31) Provider: Arna SnipeManXie Makiah Foye NP  Oneal GroutPandey, Mahima, MD  Patient Care Team: Oneal GroutPandey, Mahima, MD as PCP - General (Internal Medicine) [place] Kimber RelicGreen, Arthur G, MD as Consulting Physician (Internal Medicine) Kayliee Atienza X, NP as Nurse Practitioner (Internal Medicine)  Extended Emergency Contact Information [facility] Emergency Contact: [facility] Address: [address]          [place], [place] [address] [place] Home Phone: [phone] Relation: Son Secondary Emergency Contact: [name],[name] Address: [address]          [place], [place] [address] [place] Home Phone: [phone] Relation: Daughter  Code Status:  DNR Goals of care: Advanced Directive information Advanced Directives 10/16/2017  Does Patient Have a Medical Advance Directive? Yes  Type of Estate agentAdvance Directive Healthcare Power of [place];Living will;Out of facility DNR (pink MOST or yellow form)  Does patient want to make changes to medical advance directive? No - Patient declined  Copy of Healthcare Power of Attorney in Chart? Yes  Pre-existing out of facility DNR order (yellow form or pink MOST form) Yellow form placed in chart (order not valid for inpatient use)     Chief Complaint  Patient presents with  . Medical Management of Chronic Issues    Routine Visit     HPI:  Pt is a 84 y.o. female seen today for medical management of chronic diseases.     The patient has chronic edema BLE, L>R, wears compression stocking daily. She was noted to have long thickened toes nails then left great/2nd toe irritated with mild redness around toe nails. She resides in memory care unit, one person assist transfer, ambulates with walker on unit, taking Donepezil for memory. No constipation while on MiraLax daily, prn MOM.  Past Medical History:  Diagnosis Date  . Abnormal mammogram, unspecified 10/25/2006  .  Abnormality of gait 03/02/2011  . Allergic rhinitis due to pollen 07/13/2006  . Blood in stool 06/29/2011  . Candidiasis 07/01/2012  . Cholelithiasis 08/02/2010  . Disturbance of skin sensation 11/25/2009  . Diverticulitis of colon (without mention of hemorrhage)(562.11) 10/28/2003  . Dizziness and giddiness 07/13/2006  . Edema 07/19/2006  . Lumbago 12/22/2009  . Memory loss 02/17/2010  . Osteoarthrosis, unspecified whether generalized or localized, unspecified site 07/19/2006  . Other and unspecified hyperlipidemia 03/28/2007  . Pain in joint, upper arm 10/25/2006  . Personal history of fall 12/22/2009  . Unspecified constipation 09/26/2007  . Unspecified essential hypertension 07/19/2006  . Unspecified vitamin D deficiency 08/19/2009  . Uterine prolapse without mention of vaginal wall prolapse 06/29/2011   Past Surgical History:  Procedure Laterality Date  . CATARACT EXTRACTION W/ INTRAOCULAR LENS  IMPLANT, BILATERAL Bilateral 1999    Allergies  Allergen Reactions  . Lopid [Gemfibrozil]   . Sulfa Antibiotics   . Zocor [Simvastatin]     Allergies as of 10/16/2017      Reactions   Lopid [gemfibrozil]    Sulfa Antibiotics    Zocor [simvastatin]       Medication List        Accurate as of 10/16/17 11:59 PM. Always use your most recent med list.          acetaminophen 325 MG tablet Commonly known as:  TYLENOL Take 650 mg by mouth every 6 (six) hours as needed for mild pain.   donepezil 10 MG tablet Commonly known as:  ARICEPT One daily  to help preserve memory.   magnesium hydroxide 400 MG/5ML suspension Commonly known as:  MILK OF MAGNESIA Take 30 mLs by mouth as needed for mild constipation.   polyethylene glycol packet Commonly known as:  MIRALAX / GLYCOLAX Take 17 g by mouth daily.   TUMS 500 MG chewable tablet Generic drug:  calcium carbonate Chew 3 tablets by mouth daily.   Vitamin D 1000 units capsule Take 2,000 Units by mouth daily.      ROS was provided with  assistance of staff Review of Systems  Constitutional: Negative for activity change, appetite change, chills, diaphoresis, fatigue and fever.  HENT: Positive for hearing loss. Negative for congestion, trouble swallowing and voice change.   Respiratory: Negative for cough, shortness of breath and wheezing.   Cardiovascular: Positive for leg swelling. Negative for chest pain.  Gastrointestinal: Negative for abdominal distention, abdominal pain, constipation, diarrhea, nausea and vomiting.  Genitourinary: Negative for dysuria and urgency.  Musculoskeletal: Positive for arthralgias and gait problem.  Skin:       The left 1st, 2nd toenails thickened, long, skin irritation around these nails.   Neurological: Negative for dizziness, speech difficulty, weakness and headaches.       Dementia  Psychiatric/Behavioral: Positive for confusion. Negative for agitation, behavioral problems, hallucinations and sleep disturbance. The patient is not nervous/anxious.     Immunization History  Administered Date(s) Administered  . Influenza Whole 01/16/2012, 02/20/2013  . Influenza-Unspecified 02/19/2014, 01/05/2015, 02/01/2016  . PPD Test 08/23/2010, 08/04/2015  . Pneumococcal Polysaccharide-23 06/24/1998  . Td 05/02/1974   Pertinent  Health Maintenance Due  Topic Date Due  . DEXA SCAN  03/19/1986  . PNA vac Low Risk Adult (2 of 2 - PCV13) 06/24/1999  . INFLUENZA VACCINE  11/15/2017   Fall Risk  12/15/2016 08/06/2015 08/06/2015 11/19/2014 11/19/2014  Falls in the past year? No No No Yes No  Number falls in past yr: - - - 1 -  Injury with Fall? - - - No -   Functional Status Survey:    Vitals:   10/16/17 1558  BP: 130/70  Pulse: 70  Resp: 18  Temp: 98.2 F (36.8 C)  TempSrc: Oral  SpO2: 96%  Weight: 160 lb 12.8 oz (72.9 kg)  Height: 5\' 3"  (1.6 m)   Body mass index is 28.48 kg/m. Physical Exam  Constitutional: She appears well-developed and well-nourished.  HENT:  Head: Normocephalic and  atraumatic.  Eyes: Pupils are equal, round, and reactive to light. EOM are normal.  Neck: Normal range of motion. Neck supple. No JVD present. No thyromegaly present.  Cardiovascular: Normal rate and regular rhythm.  No murmur heard. Pulmonary/Chest: Effort normal. She has no wheezes. She has no rales.  Abdominal: Soft. Bowel sounds are normal. She exhibits no distension. There is no tenderness.  Musculoskeletal: She exhibits edema.  SBA or one person assistance with transfer, ambulates with walker, trace edema BLE L>R  Neurological: She is alert. No cranial nerve deficit. She exhibits normal muscle tone. Coordination normal.  Oriented to person and her room on unit.   Skin: Skin is warm and dry. There is erythema.  Skin around the edge of the left 1st/2nd toenails irritated, mild erythema, slightly discomfort or pain when palpated. Thickened and long(overgrown the nailbed) left 1st/2nd toe nails noted.   Psychiatric: She has a normal mood and affect. Her behavior is normal.    Labs reviewed: Recent Labs    10/31/16 01/23/17 09/20/17  NA 139 137 140  K 4.2 4.2 4.1  CL  --   --  103  CO2  --   --  31  BUN 19 25* 24*  CREATININE 1.3* 1.3* 1.2*  CALCIUM  --   --  9.2   Recent Labs    09/20/17  AST 23  ALT 22  ALKPHOS 104  PROT 6.3  ALBUMIN 3.7   Recent Labs    09/20/17  WBC 7.3  HGB 14.6  HCT 43  PLT 246   Lab Results  Component Value Date   TSH 2.42 09/20/2017   Lab Results  Component Value Date   HGBA1C 5.7 03/10/2014   Lab Results  Component Value Date   CHOL 297 (A) 04/01/2013   HDL 54 04/01/2013   LDLCALC 200 04/01/2013   TRIG 214 (A) 04/01/2013    Significant Diagnostic Results in last 30 days:  No results found.  Assessment/Plan  Onychomycosis of toenail The left 1st and 2nd toenails, thickened and long, soaking and trim needed, the skin at the edge of toenail irritation should subsided after toenails trimmed. Observe for possible infection.    Venous insufficiency of both lower extremities Chronic trace edema BLE L>R present, no change, continue compression hosiery, observe.   Advanced dementia Continue supportive care in memory care unit  Slow transit constipation Stable, continue MiraLax daily, prn MOM   Family/ staff Communication: plan of care reviewed with the patient and charge nurse.   Labs/tests ordered:  none  Time spend 25 minutes.

## 2017-10-16 NOTE — Assessment & Plan Note (Signed)
Continue supportive care in memory care unit

## 2017-10-16 NOTE — Assessment & Plan Note (Signed)
Chronic trace edema BLE L>R present, no change, continue compression hosiery, observe.

## 2017-10-17 ENCOUNTER — Encounter: Payer: Self-pay | Admitting: Nurse Practitioner

## 2017-11-19 ENCOUNTER — Non-Acute Institutional Stay (SKILLED_NURSING_FACILITY): Payer: Medicare Other | Admitting: Nurse Practitioner

## 2017-11-19 ENCOUNTER — Encounter: Payer: Self-pay | Admitting: Nurse Practitioner

## 2017-11-19 DIAGNOSIS — K5901 Slow transit constipation: Secondary | ICD-10-CM

## 2017-11-19 DIAGNOSIS — N183 Chronic kidney disease, stage 3 unspecified: Secondary | ICD-10-CM

## 2017-11-19 DIAGNOSIS — I10 Essential (primary) hypertension: Secondary | ICD-10-CM

## 2017-11-19 DIAGNOSIS — F03C0 Unspecified dementia, severe, without behavioral disturbance, psychotic disturbance, mood disturbance, and anxiety: Secondary | ICD-10-CM

## 2017-11-19 DIAGNOSIS — F0390 Unspecified dementia without behavioral disturbance: Secondary | ICD-10-CM | POA: Diagnosis not present

## 2017-11-19 NOTE — Assessment & Plan Note (Signed)
Stable, continue MiraLax daily.  

## 2017-11-19 NOTE — Progress Notes (Signed)
[facility] Room Number: 111 Place of Service:  SNF (31) Provider:  Mast, ManXie NP  Oneal GroutPandey, Mahima, MD  Patient Care Team: Oneal GroutPandey, Mahima, MD as PCP - General (Internal Medicine) [facility] Kimber RelicGreen, Arthur G, MD as Consulting Physician (Internal Medicine) Mast, Man X, NP as Nurse Practitioner (Internal Medicine)  Extended Emergency Contact Information Primary Emergency Contact: [facility] Address: [address]          [place], [place] [address] [place] Home Phone: [phone] Relation: Son Secondary Emergency Contact: [name],[name] Address: [address]          [place], [place] [address] [place] Home Phone: [phone] Relation: Daughter  Code Status:  DNR Goals of care: Advanced Directive information Advanced Directives 11/19/2017  Does Patient Have a Medical Advance Directive? Yes  Type of Estate agentAdvance Directive Healthcare Power of [place];Living will;Out of facility DNR (pink MOST or yellow form)  Does patient want to make changes to medical advance directive? No - Patient declined  Copy of Healthcare Power of Attorney in Chart? Yes  Pre-existing out of facility DNR order (yellow form or pink MOST form) Yellow form placed in chart (order not valid for inpatient use)     Chief Complaint  Patient presents with  . Medical Management of Chronic Issues    F/u- HTN, depression, abnormal gait, constipation    HPI:  Pt is a 84 y.o. female seen today for medical management of chronic diseases.     The patient has history of dementia, [facility], she needs assistance with transfer, ambulates with walker for a short distance, w/c to go further. She is on Donepezil to preserve memory. No constipation while taking MiraLax daily. Her mood is stable. Blood pressure is controlled w/o meds.   Past Medical History:  Diagnosis Date  . Abnormal mammogram, unspecified 10/25/2006  .  Abnormality of gait 03/02/2011  . Allergic rhinitis due to pollen 07/13/2006  . Blood in stool 06/29/2011  . Candidiasis 07/01/2012  . Cholelithiasis 08/02/2010  . Disturbance of skin sensation 11/25/2009  . Diverticulitis of colon (without mention of hemorrhage)(562.11) 10/28/2003  . Dizziness and giddiness 07/13/2006  . Edema 07/19/2006  . Lumbago 12/22/2009  . Memory loss 02/17/2010  . Osteoarthrosis, unspecified whether generalized or localized, unspecified site 07/19/2006  . Other and unspecified hyperlipidemia 03/28/2007  . Pain in joint, upper arm 10/25/2006  . Personal history of fall 12/22/2009  . Unspecified constipation 09/26/2007  . Unspecified essential hypertension 07/19/2006  . Unspecified vitamin D deficiency 08/19/2009  . Uterine prolapse without mention of vaginal wall prolapse 06/29/2011   Past Surgical History:  Procedure Laterality Date  . CATARACT EXTRACTION W/ INTRAOCULAR LENS  IMPLANT, BILATERAL Bilateral 1999    Allergies  Allergen Reactions  . Lopid [Gemfibrozil]   . Sulfa Antibiotics   . Zocor [Simvastatin]     Outpatient Encounter Medications as of 11/19/2017  Medication Sig  . acetaminophen (TYLENOL) 325 MG tablet Take 650 mg by mouth every 6 (six) hours as needed for mild pain.   . calcium carbonate (TUMS) 500 MG chewable tablet Chew 3 tablets by mouth daily.   . Cholecalciferol (VITAMIN D) 1000 UNITS capsule Take 2,000 Units by mouth daily.   Marland Kitchen. donepezil (ARICEPT) 10 MG tablet One daily to help preserve memory.  . polyethylene glycol (MIRALAX / GLYCOLAX) packet Take 17 g by mouth daily.  . [DISCONTINUED] magnesium hydroxide (MILK OF MAGNESIA) 400 MG/5ML  suspension Take 30 mLs by mouth as needed for mild constipation.   No facility-administered encounter medications on file as of 11/19/2017.    ROS was provided with assistance of staff Review of Systems  Constitutional: Negative for activity change, appetite change, chills, diaphoresis and fatigue.  HENT: Positive for  hearing loss. Negative for congestion and voice change.   Respiratory: Negative for cough, shortness of breath and wheezing.   Cardiovascular: Negative for chest pain and leg swelling.  Gastrointestinal: Negative for abdominal distention, abdominal pain, constipation, diarrhea, nausea and vomiting.  Genitourinary: Negative for difficulty urinating, dysuria and urgency.  Musculoskeletal: Positive for arthralgias and gait problem.  Skin: Negative for color change and pallor.  Neurological: Negative for dizziness, speech difficulty, weakness and headaches.       Dementia  Psychiatric/Behavioral: Positive for confusion. Negative for agitation, behavioral problems, hallucinations and sleep disturbance. The patient is not nervous/anxious.     Immunization History  Administered Date(s) Administered  . Influenza Whole 01/16/2012, 02/20/2013  . Influenza-Unspecified 02/19/2014, 01/05/2015, 02/01/2016  . PPD Test 08/23/2010, 08/04/2015  . Pneumococcal Polysaccharide-23 06/24/1998  . Td 05/02/1974   Pertinent  Health Maintenance Due  Topic Date Due  . DEXA SCAN  03/19/1986  . PNA vac Low Risk Adult (2 of 2 - PCV13) 06/24/1999  . INFLUENZA VACCINE  11/15/2017   Fall Risk  12/15/2016 08/06/2015 08/06/2015 11/19/2014 11/19/2014  Falls in the past year? No No No Yes No  Number falls in past yr: - - - 1 -  Injury with Fall? - - - No -   Functional Status Survey:    Vitals:   11/19/17 1403  BP: 118/70  Pulse: 83  Resp: (!) 24  Temp: 98.7 F (37.1 C)  Weight: 157 lb 3.2 oz (71.3 kg)  Height: 5\' 3"  (1.6 m)   Body mass index is 27.85 kg/m. Physical Exam  Constitutional: She appears well-developed and well-nourished.  HENT:  Head: Normocephalic and atraumatic.  Eyes: Pupils are equal, round, and reactive to light. EOM are normal.  Neck: Normal range of motion. Neck supple. No JVD present. No thyromegaly present.  Cardiovascular: Normal rate and regular rhythm.  No murmur  heard. Pulmonary/Chest: Effort normal and breath sounds normal. She has no wheezes. She has no rales.  Abdominal: Soft. Bowel sounds are normal.  Musculoskeletal: She exhibits edema.  Needs assistance with transfer. Ambulates with walker for a short distance, w/c to go further. Trace edema BLE, L>R  Neurological: She is alert.  Oriented to person and her room on unit.   Skin: Skin is warm and dry.  Psychiatric: She has a normal mood and affect. Her behavior is normal.    Labs reviewed: Recent Labs    01/23/17 09/20/17  NA 137 140  K 4.2 4.1  CL  --  103  CO2  --  31  BUN 25* 24*  CREATININE 1.3* 1.2*  CALCIUM  --  9.2   Recent Labs    09/20/17  AST 23  ALT 22  ALKPHOS 104  PROT 6.3  ALBUMIN 3.7   Recent Labs    09/20/17  WBC 7.3  HGB 14.6  HCT 43  PLT 246   Lab Results  Component Value Date   TSH 2.42 09/20/2017   Lab Results  Component Value Date   HGBA1C 5.7 03/10/2014   Lab Results  Component Value Date   CHOL 297 (A) 04/01/2013   HDL 54 04/01/2013   LDLCALC 200 04/01/2013   TRIG 214 (  A) 04/01/2013    Significant Diagnostic Results in last 30 days:  No results found.  Assessment/Plan Essential hypertension Controlled, not on meds.   Slow transit constipation Stable, continue MiraLax daily.   Advanced dementia Stable, continue memory care unit for care assistance, continue Donepezil.   CKD (chronic kidney disease) stage 3, GFR 30-59 ml/min (HCC) Stable, last creat 1.2 09/20/17     Family/ staff Communication: plan of care reviewed with the patient and charge nurse.   Labs/tests ordered:  none  Time spend 25 minutes

## 2017-11-19 NOTE — Assessment & Plan Note (Signed)
Stable, last creat 1.2 09/20/17

## 2017-11-19 NOTE — Assessment & Plan Note (Signed)
Controlled, not on meds.  

## 2017-11-19 NOTE — Assessment & Plan Note (Signed)
Stable, continue memory care unit for care assistance, continue Donepezil.

## 2017-11-21 ENCOUNTER — Encounter: Payer: Self-pay | Admitting: Nurse Practitioner

## 2017-11-28 ENCOUNTER — Encounter: Payer: Self-pay | Admitting: Internal Medicine

## 2017-12-25 ENCOUNTER — Non-Acute Institutional Stay (SKILLED_NURSING_FACILITY): Payer: Medicare Other | Admitting: Internal Medicine

## 2017-12-25 ENCOUNTER — Encounter: Payer: Self-pay | Admitting: Internal Medicine

## 2017-12-25 DIAGNOSIS — F0390 Unspecified dementia without behavioral disturbance: Secondary | ICD-10-CM

## 2017-12-25 DIAGNOSIS — F03C0 Unspecified dementia, severe, without behavioral disturbance, psychotic disturbance, mood disturbance, and anxiety: Secondary | ICD-10-CM

## 2017-12-25 DIAGNOSIS — I10 Essential (primary) hypertension: Secondary | ICD-10-CM | POA: Diagnosis not present

## 2017-12-25 DIAGNOSIS — K5901 Slow transit constipation: Secondary | ICD-10-CM | POA: Diagnosis not present

## 2017-12-25 DIAGNOSIS — R269 Unspecified abnormalities of gait and mobility: Secondary | ICD-10-CM

## 2017-12-25 DIAGNOSIS — I872 Venous insufficiency (chronic) (peripheral): Secondary | ICD-10-CM

## 2017-12-25 DIAGNOSIS — N183 Chronic kidney disease, stage 3 unspecified: Secondary | ICD-10-CM

## 2017-12-26 NOTE — Progress Notes (Signed)
[facility]  [facility] Room Number: 111 Place of Service:  SNF [phone]) Provider:  Oneal Grout MD  Oneal Grout, MD  Patient Care Team: Oneal Grout, MD as PCP - General (Internal Medicine) [facility] [name], MD as Consulting Physician (Internal Medicine) Mast, Man X, NP as Nurse Practitioner (Internal Medicine)  Extended Emergency Contact Information Primary Emergency Contact: [facility] Address: [address]          [place], [place] [address] [place] Home Phone: [phone] Relation: Son Secondary Emergency Contact: [name],[name] Address: [address]          [place], [place] [address] [place] Home Phone: [phone] Relation: Daughter  Code Status:  DNR  Goals of care: Advanced Directive information Advanced Directives 11/19/2017  Does Patient Have a Medical Advance Directive? Yes  Type of Estate agent of [place];Living will;Out of facility DNR (pink MOST or yellow form)  Does patient want to make changes to medical advance directive? No - Patient declined  Copy of Healthcare Power of Attorney in Chart? Yes  Pre-existing out of facility DNR order (yellow form or pink MOST form) Yellow form placed in chart (order not valid for inpatient use)     Chief Complaint  Patient presents with  . Medical Management of Chronic Issues    routine 3 monthly follow up    HPI:  Pt is a 84 y.o. female seen today for medical management of chronic diseases. She is pleasantly confused and denies any concern. She is sitting on her recliner. She takes tylenol as needed for her arthritis pain. She is using walker for ambulation. No fall reported after 12/09/17 fall. She feeds herself and has been eating well. She needs cueing and redirection with meals. She is using miralax for her bowel movement. Tolerates calcium and vitamin d well. Taking donepezil for her memory issue. No acute behavior  changes reported.    Past Medical History:  Diagnosis Date  . Abnormal mammogram, unspecified 10/25/2006  . Abnormality of gait 03/02/2011  . Allergic rhinitis due to pollen 07/13/2006  . Blood in stool 06/29/2011  . Candidiasis 07/01/2012  . Cholelithiasis 08/02/2010  . Disturbance of skin sensation 11/25/2009  . Diverticulitis of colon (without mention of hemorrhage)(562.11) 10/28/2003  . Dizziness and giddiness 07/13/2006  . Edema 07/19/2006  . Lumbago 12/22/2009  . Memory loss 02/17/2010  . Osteoarthrosis, unspecified whether generalized or localized, unspecified site 07/19/2006  . Other and unspecified hyperlipidemia 03/28/2007  . Pain in joint, upper arm 10/25/2006  . Personal history of fall 12/22/2009  . Unspecified constipation 09/26/2007  . Unspecified essential hypertension 07/19/2006  . Unspecified vitamin D deficiency 08/19/2009  . Uterine prolapse without mention of vaginal wall prolapse 06/29/2011   Past Surgical History:  Procedure Laterality Date  . CATARACT EXTRACTION W/ INTRAOCULAR LENS  IMPLANT, BILATERAL Bilateral 1999    Allergies  Allergen Reactions  . Lopid [Gemfibrozil]   . Sulfa Antibiotics   . Zocor [Simvastatin]     Outpatient Encounter Medications as of 12/25/2017  Medication Sig  . acetaminophen (TYLENOL) 325 MG tablet Take 650 mg by mouth every 6 (six) hours as needed for mild pain.   . calcium carbonate (TUMS) 500 MG chewable tablet Chew 3 tablets by mouth daily.   . Cholecalciferol (VITAMIN D) 1000 UNITS capsule Take 2,000 Units by mouth daily.   Marland Kitchen donepezil (ARICEPT) 10 MG tablet One daily to help preserve memory.  . polyethylene  glycol (MIRALAX / GLYCOLAX) packet Take 17 g by mouth daily.   No facility-administered encounter medications on file as of 12/25/2017.     Review of Systems  Reason unable to perform ROS: limtied with her dementia.  Constitutional: Negative for appetite change and chills.  HENT: Positive for hearing loss. Negative for mouth sores.    Eyes: Positive for visual disturbance.       Has corrective glasses  Respiratory: Negative for cough and shortness of breath.   Cardiovascular: Positive for leg swelling. Negative for chest pain.  Gastrointestinal: Positive for constipation. Negative for diarrhea, nausea and vomiting.  Genitourinary:       Incontinent with bowel and bladder  Musculoskeletal: Positive for gait problem. Negative for back pain.  Neurological: Negative for headaches.  Psychiatric/Behavioral: Positive for confusion. Negative for behavioral problems.    Immunization History  Administered Date(s) Administered  . Influenza Whole 01/16/2012, 02/20/2013  . Influenza-Unspecified 02/19/2014, 01/05/2015, 02/01/2016  . PPD Test 08/23/2010, 08/04/2015  . Pneumococcal Polysaccharide-23 06/24/1998  . Td 05/02/1974   Pertinent  Health Maintenance Due  Topic Date Due  . DEXA SCAN  03/19/1986  . PNA vac Low Risk Adult (2 of 2 - PCV13) 06/24/1999  . INFLUENZA VACCINE  11/15/2017   Fall Risk  12/15/2016 08/06/2015 08/06/2015 11/19/2014 11/19/2014  Falls in the past year? No No No Yes No  Number falls in past yr: - - - 1 -  Injury with Fall? - - - No -   Functional Status Survey:    Vitals:   12/25/17 1422  BP: 118/68  Pulse: 70  Resp: 18  Temp: 98 F (36.7 C)  SpO2: 94%  Weight: 158 lb 12.8 oz (72 kg)  Height: 5\' 3"  (1.6 m)   Body mass index is 28.13 kg/m.   Wt Readings from Last 3 Encounters:  12/25/17 158 lb 12.8 oz (72 kg)  11/19/17 157 lb 3.2 oz (71.3 kg)  10/16/17 160 lb 12.8 oz (72.9 kg)   Physical Exam  Constitutional: No distress.  Overweight elderly female  HENT:  Head: Normocephalic and atraumatic.  Mouth/Throat: Oropharynx is clear and moist.  Eyes: Conjunctivae and EOM are normal.  Has corrective glasses  Neck: Neck supple.  Cardiovascular: Normal rate and regular rhythm.  Pulmonary/Chest: Effort normal. No respiratory distress. She has no wheezes.  Poor air movement to lung bases    Abdominal: Soft. Bowel sounds are normal. There is no tenderness.  Musculoskeletal: She exhibits edema.  Can move all 4 ext, uses walker with contact guard assist, unsteady gait, chronic leg edema left > right.   Lymphadenopathy:    She has no cervical adenopathy.  Neurological: She is alert.  Oriented only to self, has difficulty expressing her thoughts  Skin: Skin is warm and dry. She is not diaphoretic.    Labs reviewed: Recent Labs    01/23/17 09/20/17  NA 137 140  K 4.2 4.1  CL  --  103  CO2  --  31  BUN 25* 24*  CREATININE 1.3* 1.2*  CALCIUM  --  9.2   Recent Labs    09/20/17  AST 23  ALT 22  ALKPHOS 104  PROT 6.3  ALBUMIN 3.7   Recent Labs    09/20/17  WBC 7.3  HGB 14.6  HCT 43  PLT 246   Lab Results  Component Value Date   TSH 2.42 09/20/2017   Lab Results  Component Value Date   HGBA1C 5.7 03/10/2014   Lab Results  Component Value Date   CHOL 297 (A) 04/01/2013   HDL 54 04/01/2013   LDLCALC 200 04/01/2013   TRIG 214 (A) 04/01/2013    Significant Diagnostic Results in last 30 days:  No results found.  Assessment/Plan  1. Essential hypertension Off all medication, monitor clinically  2. Venous insufficiency of both lower extremities Encourage to have legs elevated in recliner when resting, monitor and provide skin care  3. Slow transit constipation C/w miralax, maintain hydration  4. Advanced dementia Supportive care, donepezil, assistance with ADLs  5. Abnormality of gait Fall precautions, walker with contact guard assist, poor safety awareness. Continue calcium and vit d supplement  6. CKD (chronic kidney disease) stage 3, GFR 30-59 ml/min (HCC) Monitor renal function periodically    Family/ staff Communication: reviewed care plan with patient and charge nurse.    Labs/tests ordered:  none   Oneal Grout, MD Internal Medicine [facility] [address] [place], [place] [address] Cell  Phone (Monday-Friday 8 am - 5 pm): [phone] On Call: [phone] and follow prompts after 5 pm and on weekends Office Phone: [phone] Office Fax: [phone]

## 2018-01-22 ENCOUNTER — Non-Acute Institutional Stay (SKILLED_NURSING_FACILITY): Payer: Medicare Other | Admitting: Nurse Practitioner

## 2018-01-22 ENCOUNTER — Encounter: Payer: Self-pay | Admitting: Nurse Practitioner

## 2018-01-22 DIAGNOSIS — F0390 Unspecified dementia without behavioral disturbance: Secondary | ICD-10-CM

## 2018-01-22 DIAGNOSIS — K5901 Slow transit constipation: Secondary | ICD-10-CM | POA: Diagnosis not present

## 2018-01-22 DIAGNOSIS — F03C0 Unspecified dementia, severe, without behavioral disturbance, psychotic disturbance, mood disturbance, and anxiety: Secondary | ICD-10-CM

## 2018-01-22 DIAGNOSIS — R635 Abnormal weight gain: Secondary | ICD-10-CM | POA: Diagnosis not present

## 2018-01-22 NOTE — Assessment & Plan Note (Signed)
No constipation, continue MiraLax daily.  

## 2018-01-22 NOTE — Progress Notes (Signed)
[facility]  [facility] Room Number: 111 Place of Service:  SNF (31) Provider: Eilis Chestnutt, ManXie  NP  Oneal Grout, MD  Patient Care Team: Oneal Grout, MD as PCP - General (Internal Medicine) [facility] [name], MD as Consulting Physician (Internal Medicine) Kohen Reither X, NP as Nurse Practitioner (Internal Medicine)  Extended Emergency Contact Information Primary Emergency Contact: [facility] Address: [address]          [place], [place] [address] [place] Home Phone: [phone] Relation: Son Secondary Emergency Contact: [name],[name] Address: [address]          [place], [place] [address] [place] Home Phone: [phone] Relation: Daughter  Code Status:  DNR Goals of care: Advanced Directive information Advanced Directives 01/22/2018  Does Patient Have a Medical Advance Directive? Yes  Type of Estate agent of [place];Living will;Out of facility DNR (pink MOST or yellow form)  Does patient want to make changes to medical advance directive? No - Patient declined  Copy of Healthcare Power of Attorney in Chart? Yes  Pre-existing out of facility DNR order (yellow form or pink MOST form) Yellow form placed in chart (order not valid for inpatient use)     Chief Complaint  Patient presents with  . Medical Management of Chronic Issues    F/u-HTN, constipation, dementia, abnormal gait,     HPI:  Pt is a 84 y.o. female seen today for medical management of chronic diseases.    The patient was noted to have weight gain, #162Ibs 01/22/18, # 158 Ibs 12/25/17. She denied cough, SOB, fever, or O2 desaturation. HPI was provided with assistance of staff, she resides in memory care unit [facility] for safety and care assistance, on Donepezil 10mg  qd for memory. No constipation while on MiraLax daily.   Past Medical History:  Diagnosis Date  . Abnormal mammogram, unspecified 10/25/2006  .  Abnormality of gait 03/02/2011  . Allergic rhinitis due to pollen 07/13/2006  . Blood in stool 06/29/2011  . Candidiasis 07/01/2012  . Cholelithiasis 08/02/2010  . Disturbance of skin sensation 11/25/2009  . Diverticulitis of colon (without mention of hemorrhage)(562.11) 10/28/2003  . Dizziness and giddiness 07/13/2006  . Edema 07/19/2006  . Lumbago 12/22/2009  . Memory loss 02/17/2010  . Osteoarthrosis, unspecified whether generalized or localized, unspecified site 07/19/2006  . Other and unspecified hyperlipidemia 03/28/2007  . Pain in joint, upper arm 10/25/2006  . Personal history of fall 12/22/2009  . Unspecified constipation 09/26/2007  . Unspecified essential hypertension 07/19/2006  . Unspecified vitamin D deficiency 08/19/2009  . Uterine prolapse without mention of vaginal wall prolapse 06/29/2011   Past Surgical History:  Procedure Laterality Date  . CATARACT EXTRACTION W/ INTRAOCULAR LENS  IMPLANT, BILATERAL Bilateral 1999    Allergies  Allergen Reactions  . Lopid [Gemfibrozil]   . Sulfa Antibiotics   . Zocor [Simvastatin]     Outpatient Encounter Medications as of 01/22/2018  Medication Sig  . acetaminophen (TYLENOL) 325 MG tablet Take 650 mg by mouth every 6 (six) hours as needed for mild pain.   . calcium carbonate (TUMS) 500 MG chewable tablet Chew 3 tablets by mouth daily.   . Cholecalciferol (VITAMIN D) 1000 UNITS capsule Take 2,000 Units by mouth daily.   Marland Kitchen donepezil (ARICEPT) 10 MG tablet One daily to help preserve memory.  . polyethylene glycol (MIRALAX / GLYCOLAX) packet Take 17 g by mouth daily.   No facility-administered encounter medications on file as  of 01/22/2018.    ROS was provided with assistance of staff Review of Systems  Constitutional: Positive for unexpected weight change. Negative for activity change, appetite change, chills, diaphoresis, fatigue and fever.  HENT: Positive for hearing loss. Negative for congestion and voice change.   Respiratory: Negative for  cough and shortness of breath.   Cardiovascular: Positive for leg swelling. Negative for chest pain and palpitations.  Gastrointestinal: Negative for abdominal distention, abdominal pain, constipation, diarrhea, nausea and vomiting.  Genitourinary: Negative for difficulty urinating, dysuria and urgency.       Incontinent of urine.   Musculoskeletal: Positive for arthralgias and gait problem.  Skin: Negative for color change and pallor.  Neurological: Negative for dizziness, speech difficulty, weakness and headaches.       Dementia  Psychiatric/Behavioral: Positive for confusion. Negative for agitation, behavioral problems and sleep disturbance. The patient is not nervous/anxious.     Immunization History  Administered Date(s) Administered  . Influenza Whole 01/16/2012, 02/20/2013  . Influenza-Unspecified 02/19/2014, 01/05/2015, 02/01/2016  . PPD Test 08/23/2010, 08/04/2015  . Pneumococcal Polysaccharide-23 06/24/1998  . Td 05/02/1974   Pertinent  Health Maintenance Due  Topic Date Due  . DEXA SCAN  03/19/1986  . PNA vac Low Risk Adult (2 of 2 - PCV13) 06/24/1999  . INFLUENZA VACCINE  11/15/2017   Fall Risk  12/15/2016 08/06/2015 08/06/2015 11/19/2014 11/19/2014  Falls in the past year? No No No Yes No  Number falls in past yr: - - - 1 -  Injury with Fall? - - - No -   Functional Status Survey:    Vitals:   01/22/18 1157  BP: 132/68  Pulse: 80  Resp: 17  Temp: 97.7 F (36.5 C)  SpO2: 94%  Weight: 162 lb 6.4 oz (73.7 kg)  Height: 5\' 3"  (1.6 m)   Body mass index is 28.77 kg/m. Physical Exam  Constitutional: She appears well-developed and well-nourished.  HENT:  Head: Normocephalic and atraumatic.  Eyes: Pupils are equal, round, and reactive to light. EOM are normal.  Neck: Normal range of motion. Neck supple. No JVD present. No thyromegaly present.  Cardiovascular: Normal rate and regular rhythm.  No murmur heard. Pulmonary/Chest: Effort normal. She has no wheezes. She  has no rales.  Abdominal: Soft. She exhibits no distension. There is no tenderness. There is no rebound and no guarding.  Musculoskeletal: She exhibits edema.  1+edema BLE. Needs assistance for transfer. Ambulates with walker for a short distance, w/c to go further.   Neurological: She is alert. No cranial nerve deficit. She exhibits normal muscle tone. Coordination normal.  Oriented to person and her room on unit.   Skin: Skin is warm and dry.  Psychiatric: She has a normal mood and affect. Her behavior is normal.    Labs reviewed: Recent Labs    09/20/17  NA 140  K 4.1  CL 103  CO2 31  BUN 24*  CREATININE 1.2*  CALCIUM 9.2   Recent Labs    09/20/17  AST 23  ALT 22  ALKPHOS 104  PROT 6.3  ALBUMIN 3.7   Recent Labs    09/20/17  WBC 7.3  HGB 14.6  HCT 43  PLT 246   Lab Results  Component Value Date   TSH 2.42 09/20/2017   Lab Results  Component Value Date   HGBA1C 5.7 03/10/2014   Lab Results  Component Value Date   CHOL 297 (A) 04/01/2013   HDL 54 04/01/2013   LDLCALC 200 04/01/2013  TRIG 214 (A) 04/01/2013    Significant Diagnostic Results in last 30 days:  No results found.  Assessment/Plan Weight gain weight gain, #162Ibs 01/22/18, # 158 Ibs 12/25/17. She denied cough, SOB, fever, or O2 desaturation. Will weigh the patient 2x/week ac breakfast x 2 week, then reevluate.   Slow transit constipation No constipation, continue MiraLax daily.    Advanced dementia HPI was provided with assistance of staff, she resides in memory care unit [facility] for safety and care assistance, continue Donepezil 10mg  qd for memory.      Family/ staff Communication: plan of care reviewed with the patient and charge nurse.   Labs/tests ordered:  none  Time spend 25 minutes

## 2018-01-22 NOTE — Assessment & Plan Note (Signed)
weight gain, #162Ibs 01/22/18, # 158 Ibs 12/25/17. She denied cough, SOB, fever, or O2 desaturation. Will weigh the patient 2x/week ac breakfast x 2 week, then reevluate.

## 2018-01-22 NOTE — Assessment & Plan Note (Signed)
HPI was provided with assistance of staff, she resides in memory care unit [facility] for safety and care assistance, continue Donepezil 10mg  qd for memory.

## 2018-01-23 ENCOUNTER — Encounter: Payer: Self-pay | Admitting: Nurse Practitioner

## 2018-02-19 ENCOUNTER — Non-Acute Institutional Stay (SKILLED_NURSING_FACILITY): Payer: Medicare Other | Admitting: Nurse Practitioner

## 2018-02-19 ENCOUNTER — Encounter: Payer: Self-pay | Admitting: Nurse Practitioner

## 2018-02-19 DIAGNOSIS — R269 Unspecified abnormalities of gait and mobility: Secondary | ICD-10-CM | POA: Diagnosis not present

## 2018-02-19 DIAGNOSIS — F0390 Unspecified dementia without behavioral disturbance: Secondary | ICD-10-CM

## 2018-02-19 DIAGNOSIS — F03C0 Unspecified dementia, severe, without behavioral disturbance, psychotic disturbance, mood disturbance, and anxiety: Secondary | ICD-10-CM

## 2018-02-19 DIAGNOSIS — K5901 Slow transit constipation: Secondary | ICD-10-CM | POA: Diagnosis not present

## 2018-02-19 DIAGNOSIS — I872 Venous insufficiency (chronic) (peripheral): Secondary | ICD-10-CM

## 2018-02-19 NOTE — Progress Notes (Signed)
Location:  [facility] Room Number: 111 Place of Service:  SNF (31) Provider:  Mast, Arna Snipe  NP  Mast, Man X, NP  Patient Care Team: Mast, Man X, NP as PCP - General (Internal Medicine) [place] Mast, Man X, NP as Nurse Practitioner (Internal Medicine)  Extended Emergency Contact Information Primary Emergency Contact: Manke,Ron Address: [address]          [place], [place] [address] [place] Home Phone: [phone] Relation: Son Secondary Emergency Contact: [name],[name] Address: [address]          [place], [place] [address] [place] Home Phone: [phone] Relation: Daughter  Code Status:  DNR Goals of care: Advanced Directive information Advanced Directives 02/19/2018  Does Patient Have a Medical Advance Directive? Yes  Type of Estate agent of [place];Living will;Out of facility DNR (pink MOST or yellow form)  Does patient want to make changes to medical advance directive? No - Patient declined  Copy of Healthcare Power of Attorney in Chart? Yes  Pre-existing out of facility DNR order (yellow form or pink MOST form) Yellow form placed in chart (order not valid for inpatient use)     Chief Complaint  Patient presents with  . Medical Management of Chronic Issues    F/u- HTN, wt.gain, constipation, dementia, CKD    HPI:  Pt is a 84 y.o. female seen today for medical management of chronic diseases.    The patient resides in memory care unit [facility] for safety and care assistance, on Donepezil 10mg  qd for memory. No constipation while taking MiraLax daily. She needs SBA or limited assistance with transfer, ambulates with walker, w/c to go further. Chronic edema remains no change LLE>RLL  Past Medical History:  Diagnosis Date  . Abnormal mammogram, unspecified 10/25/2006  . Abnormality of gait 03/02/2011  . Allergic rhinitis due to pollen 07/13/2006  . Blood in stool 06/29/2011  .  Candidiasis 07/01/2012  . Cholelithiasis 08/02/2010  . Disturbance of skin sensation 11/25/2009  . Diverticulitis of colon (without mention of hemorrhage)(562.11) 10/28/2003  . Dizziness and giddiness 07/13/2006  . Edema 07/19/2006  . Lumbago 12/22/2009  . Memory loss 02/17/2010  . Osteoarthrosis, unspecified whether generalized or localized, unspecified site 07/19/2006  . Other and unspecified hyperlipidemia 03/28/2007  . Pain in joint, upper arm 10/25/2006  . Personal history of fall 12/22/2009  . Unspecified constipation 09/26/2007  . Unspecified essential hypertension 07/19/2006  . Unspecified vitamin D deficiency 08/19/2009  . Uterine prolapse without mention of vaginal wall prolapse 06/29/2011   Past Surgical History:  Procedure Laterality Date  . CATARACT EXTRACTION W/ INTRAOCULAR LENS  IMPLANT, BILATERAL Bilateral 1999    Allergies  Allergen Reactions  . Lopid [Gemfibrozil]   . Sulfa Antibiotics   . Zocor [Simvastatin]     Outpatient Encounter Medications as of 02/19/2018  Medication Sig  . acetaminophen (TYLENOL) 325 MG tablet Take 650 mg by mouth every 6 (six) hours as needed for mild pain.   . calcium carbonate (TUMS) 500 MG chewable tablet Chew 3 tablets by mouth daily.   . Cholecalciferol (VITAMIN D) 1000 UNITS capsule Take 2,000 Units by mouth daily.   Marland Kitchen donepezil (ARICEPT) 10 MG tablet One daily to help preserve memory.  . polyethylene glycol (MIRALAX / GLYCOLAX) packet Take 17 g by mouth daily.   No facility-administered encounter medications on file as of 02/19/2018.    ROS was provided with assistance of staff Review of  Systems  Constitutional: Negative for activity change, appetite change, chills, diaphoresis, fatigue, fever and unexpected weight change.  HENT: Positive for hearing loss. Negative for congestion and voice change.   Respiratory: Negative for cough, shortness of breath and wheezing.   Cardiovascular: Positive for leg swelling. Negative for chest pain and  palpitations.  Gastrointestinal: Negative for abdominal distention, abdominal pain, constipation, diarrhea, nausea and vomiting.  Genitourinary: Negative for difficulty urinating, dysuria and urgency.       Incontinent of urine.   Musculoskeletal: Positive for arthralgias and joint swelling.  Skin: Negative for color change and pallor.  Neurological: Negative for dizziness, speech difficulty, weakness and headaches.       Dementia  Psychiatric/Behavioral: Positive for confusion. Negative for agitation, behavioral problems, hallucinations and sleep disturbance. The patient is not nervous/anxious.     Immunization History  Administered Date(s) Administered  . Influenza Whole 01/16/2012, 02/20/2013, 01/17/2018  . Influenza-Unspecified 02/19/2014, 01/05/2015, 02/01/2016  . PPD Test 08/23/2010, 08/04/2015  . Pneumococcal Polysaccharide-23 06/24/1998  . Td 05/02/1974   Pertinent  Health Maintenance Due  Topic Date Due  . DEXA SCAN  03/19/1986  . PNA vac Low Risk Adult (2 of 2 - PCV13) 06/24/1999  . INFLUENZA VACCINE  Completed   Fall Risk  12/15/2016 08/06/2015 08/06/2015 11/19/2014 11/19/2014  Falls in the past year? No No No Yes No  Number falls in past yr: - - - 1 -  Injury with Fall? - - - No -   Functional Status Survey:    Vitals:   02/19/18 1039  BP: 124/76  Pulse: 72  Resp: 20  Temp: 97.8 F (36.6 C)  SpO2: 94%  Weight: 161 lb 9.6 oz (73.3 kg)  Height: 5\' 3"  (1.6 m)   Body mass index is 28.63 kg/m. Physical Exam  Constitutional: She appears well-developed and well-nourished.  HENT:  Head: Normocephalic and atraumatic.  Eyes: Pupils are equal, round, and reactive to light. EOM are normal.  Neck: Normal range of motion. Neck supple. No JVD present. No thyromegaly present.  Cardiovascular: Normal rate and regular rhythm.  No murmur heard. Pulmonary/Chest: Effort normal. Stridor present. She has no wheezes. She has no rales.  Abdominal: Soft. She exhibits no distension.  There is no tenderness. There is no rebound and no guarding.  Musculoskeletal: She exhibits edema.  SBA or limited assistance for transfer. Ambulates with walker with SBA. 1+ edema LLE. Trace edema RLE  Neurological: She is alert. No cranial nerve deficit. She exhibits normal muscle tone. Coordination normal.  Oriented to self and her room on unit.   Skin: Skin is warm and dry.  Psychiatric: She has a normal mood and affect. Her behavior is normal.    Labs reviewed: Recent Labs    09/20/17  NA 140  K 4.1  CL 103  CO2 31  BUN 24*  CREATININE 1.2*  CALCIUM 9.2   Recent Labs    09/20/17  AST 23  ALT 22  ALKPHOS 104  PROT 6.3  ALBUMIN 3.7   Recent Labs    09/20/17  WBC 7.3  HGB 14.6  HCT 43  PLT 246   Lab Results  Component Value Date   TSH 2.42 09/20/2017   Lab Results  Component Value Date   HGBA1C 5.7 03/10/2014   Lab Results  Component Value Date   CHOL 297 (A) 04/01/2013   HDL 54 04/01/2013   LDLCALC 200 04/01/2013   TRIG 214 (A) 04/01/2013    Significant Diagnostic Results in last  30 days:  No results found.  Assessment/Plan Venous insufficiency of both lower extremities Chronic edema remains no change LLE>RLL, weight is stable, no cough, sputum production, chest pain or pressure, or palpitation noted.   Slow transit constipation Stable, continue MiraLax daily.   Advanced dementia Continue memory care unit [facility] for safety and care assistance, continue Donepezil 10mg  for memory.   Abnormality of gait Assistant the patient for transfer, SBA for ambulation with walker, w/c to go further. Risk of falling due to her lack of safety awareness related to dementia and increased physical frailty.      Family/ staff Communication: plan of care reviewed with the patient and charge nurse.   Labs/tests ordered:  none  Time spend 25 minutes.

## 2018-02-19 NOTE — Assessment & Plan Note (Signed)
Continue memory care unit [facility] for safety and care assistance, continue Donepezil 10mg  for memory.

## 2018-02-19 NOTE — Assessment & Plan Note (Signed)
Chronic edema remains no change LLE>RLL, weight is stable, no cough, sputum production, chest pain or pressure, or palpitation noted.

## 2018-02-19 NOTE — Assessment & Plan Note (Signed)
Assistant the patient for transfer, SBA for ambulation with walker, w/c to go further. Risk of falling due to her lack of safety awareness related to dementia and increased physical frailty.

## 2018-02-19 NOTE — Assessment & Plan Note (Signed)
Stable, continue MiraLax daily.  

## 2018-02-20 ENCOUNTER — Encounter: Payer: Self-pay | Admitting: Nurse Practitioner

## 2018-03-28 ENCOUNTER — Non-Acute Institutional Stay (SKILLED_NURSING_FACILITY): Payer: Medicare Other | Admitting: Family Medicine

## 2018-03-28 ENCOUNTER — Encounter: Payer: Self-pay | Admitting: Family Medicine

## 2018-03-28 DIAGNOSIS — R269 Unspecified abnormalities of gait and mobility: Secondary | ICD-10-CM

## 2018-03-28 DIAGNOSIS — F0390 Unspecified dementia without behavioral disturbance: Secondary | ICD-10-CM

## 2018-03-28 DIAGNOSIS — K5901 Slow transit constipation: Secondary | ICD-10-CM | POA: Diagnosis not present

## 2018-03-28 DIAGNOSIS — I10 Essential (primary) hypertension: Secondary | ICD-10-CM

## 2018-03-28 DIAGNOSIS — F03C0 Unspecified dementia, severe, without behavioral disturbance, psychotic disturbance, mood disturbance, and anxiety: Secondary | ICD-10-CM

## 2018-03-28 NOTE — Progress Notes (Signed)
Provider:  Jacalyn Lefevre, MD Location:  [facility] Room Number: 111 Place of Service:  SNF (31)  PCP: Mast, Man X, NP Patient Care Team: Mast, Man X, NP as PCP - General (Internal Medicine) [place] Mast, Man X, NP as Nurse Practitioner (Internal Medicine)  Extended Emergency Contact Information Primary Emergency Contact: Aeschliman,Ron Address: [address]          [place], [place] [address] [place] Home Phone: [phone] Relation: Son Secondary Emergency Contact: [name],[name] Address: [address]          [place], [place] [address] [place] Home Phone: [phone] Relation: Daughter  Code Status: DNR Goals of Care: Advanced Directive information Advanced Directives 03/28/2018  Does Patient Have a Medical Advance Directive? Yes  Type of Estate agent of [place];Out of facility DNR (pink MOST or yellow form)  Does patient want to make changes to medical advance directive? No - Patient declined  Copy of Healthcare Power of Attorney in Chart? Yes - validated most recent copy scanned in chart (See row information)  Pre-existing out of facility DNR order (yellow form or pink MOST form) Yellow form placed in chart (order not valid for inpatient use)      Chief Complaint  Patient presents with  . Medical Management of Chronic Issues    routine visit     HPI: Patient is a 84 y.o. female seen today for medical management of chronic problems including: Unspecified dementia, venous insufficiency, constipation, hypertension,.  Patient resides in memory care.  She has had no recent problems per nursing.  She is not able to give history or review of systems due to dementia.  She does take Aricept.  Other medicines are of the symptomatic type including MiraLAX for constipation and x4 GERD.  Past Medical History:  Diagnosis Date  . Abnormal mammogram, unspecified 10/25/2006  . Abnormality of  gait 03/02/2011  . Allergic rhinitis due to pollen 07/13/2006  . Blood in stool 06/29/2011  . Candidiasis 07/01/2012  . Cholelithiasis 08/02/2010  . Disturbance of skin sensation 11/25/2009  . Diverticulitis of colon (without mention of hemorrhage)(562.11) 10/28/2003  . Dizziness and giddiness 07/13/2006  . Edema 07/19/2006  . Lumbago 12/22/2009  . Memory loss 02/17/2010  . Osteoarthrosis, unspecified whether generalized or localized, unspecified site 07/19/2006  . Other and unspecified hyperlipidemia 03/28/2007  . Pain in joint, upper arm 10/25/2006  . Personal history of fall 12/22/2009  . Unspecified constipation 09/26/2007  . Unspecified essential hypertension 07/19/2006  . Unspecified vitamin D deficiency 08/19/2009  . Uterine prolapse without mention of vaginal wall prolapse 06/29/2011   Past Surgical History:  Procedure Laterality Date  . CATARACT EXTRACTION W/ INTRAOCULAR LENS  IMPLANT, BILATERAL Bilateral 1999    reports that she has never smoked. She has never used smokeless tobacco. She reports that she does not drink alcohol or use drugs. Social History   Socioeconomic History  . Marital status: Married    Spouse name: Not on file  . Number of children: Not on file  . Years of education: Not on file  . Highest education level: Not on file  Occupational History  . Occupation: retired Paramedic work  Engineer, production  . Financial resource strain: Not on file  . Food insecurity:    Worry: Not on file    Inability: Not on file  . Transportation needs:    Medical: Not on file    Non-medical: Not on  file  Tobacco Use  . Smoking status: Never Smoker  . Smokeless tobacco: Never Used  Substance and Sexual Activity  . Alcohol use: No  . Drug use: No  . Sexual activity: Never  Lifestyle  . Physical activity:    Days per week: Not on file    Minutes per session: Not on file  . Stress: Not on file  Relationships  . Social connections:    Talks on phone: Not on file    Gets together: Not on  file    Attends religious service: Not on file    Active member of club or organization: Not on file    Attends meetings of clubs or organizations: Not on file    Relationship status: Not on file  . Intimate partner violence:    Fear of current or ex partner: Not on file    Emotionally abused: Not on file    Physically abused: Not on file    Forced sexual activity: Not on file  Other Topics Concern  . Not on file  Social History Narrative   [facility]   DNR   [place]   Widowed husband died 2005   Walks with walker   Never smoked   Alcohol none    Functional Status Survey:    Family History  Problem Relation Age of Onset  . Heart disease Father        MI    Health Maintenance  Topic Date Due  . DEXA SCAN  03/19/1986  . PNA vac Low Risk Adult (2 of 2 - PCV13) 06/24/1999  . TETANUS/TDAP  10/14/2026 (Originally 05/02/1984)  . INFLUENZA VACCINE  Completed    Allergies  Allergen Reactions  . Lopid [Gemfibrozil]   . Sulfa Antibiotics   . Zocor [Simvastatin]     Outpatient Encounter Medications as of 03/28/2018  Medication Sig  . acetaminophen (TYLENOL) 325 MG tablet Take 650 mg by mouth every 6 (six) hours as needed for mild pain.   . calcium carbonate (TUMS) 500 MG chewable tablet Chew 3 tablets by mouth daily.   . Cholecalciferol (VITAMIN D) 1000 UNITS capsule Take 2,000 Units by mouth daily.   Marland Kitchen. donepezil (ARICEPT) 10 MG tablet One daily to help preserve memory.  . polyethylene glycol (MIRALAX / GLYCOLAX) packet Take 17 g by mouth daily.   No facility-administered encounter medications on file as of 03/28/2018.     Review of Systems  Unable to perform ROS: Dementia  Constitutional: Negative.   HENT: Negative.   Psychiatric/Behavioral: Positive for confusion.    Vitals:   03/28/18 1044  BP: (!) 112/58  Pulse: 70  Resp: (!) 22  Temp: 98.6 F (37 C)  SpO2: 94%  Weight: 162 lb 3.2 oz (73.6 kg)    Height: 5\' 3"  (1.6 m)   Body mass index is 28.73 kg/m. Physical Exam Vitals signs and nursing note reviewed.  Constitutional:      Appearance: She is obese.  HENT:     Head: Normocephalic.     Mouth/Throat:     Mouth: Mucous membranes are moist.  Eyes:     Pupils: Pupils are equal, round, and reactive to light.  Cardiovascular:     Rate and Rhythm: Normal rate and regular rhythm.  Pulmonary:     Effort: Pulmonary effort is normal.     Breath sounds: Normal breath sounds.  Neurological:     General: No focal deficit present.  Mental Status: She is alert.     Comments: Oriented only to self     Labs reviewed: Basic Metabolic Panel: Recent Labs    09/20/17  NA 140  K 4.1  CL 103  CO2 31  BUN 24*  CREATININE 1.2*  CALCIUM 9.2   Liver Function Tests: Recent Labs    09/20/17  AST 23  ALT 22  ALKPHOS 104  PROT 6.3  ALBUMIN 3.7   No results for input(s): LIPASE, AMYLASE in the last 8760 hours. No results for input(s): AMMONIA in the last 8760 hours. CBC: Recent Labs    09/20/17  WBC 7.3  HGB 14.6  HCT 43  PLT 246   Cardiac Enzymes: No results for input(s): CKTOTAL, CKMB, CKMBINDEX, TROPONINI in the last 8760 hours. BNP: Invalid input(s): POCBNP Lab Results  Component Value Date   HGBA1C 5.7 03/10/2014   Lab Results  Component Value Date   TSH 2.42 09/20/2017   No results found for: VITAMINB12 No results found for: FOLATE No results found for: IRON, TIBC, FERRITIN  Imaging and Procedures obtained prior to SNF admission: Dg Chest 2 View  Result Date: 08/05/2010 *RADIOLOGY REPORT* Clinical Data: Chest pain. CHEST - 2 VIEW Comparison: None Findings: Heart and mediastinal contours are within normal limits. No focal opacities or effusions.  No acute bony abnormality. IMPRESSION: No active disease. Original Report Authenticated By: Cyndie Chime, M.D.  Dg Abd 1 View  Result Date: 08/06/2010 *RADIOLOGY REPORT* Clinical Data: Abdominal pain.  ABDOMEN - 1 VIEW Comparison: 12/22/2009 Findings: The bowel gas pattern is unremarkable. There is no evidence of bowel obstruction. No suspicious calcifications are identified. Lumbar spine scoliosis and moderate degenerative changes are again noted. IMPRESSION: No acute abnormalities - unremarkable bowel gas pattern. Lumbar spine scoliosis and degenerative changes. Original Report Authenticated By: Rosendo Gros, M.D.  Nm Hepatobiliary Liver Func  Result Date: 08/06/2010 *RADIOLOGY REPORT* Clinical Data:  Abdominal pain and increased LFTs. Zetia induced hepatitis. NUCLEAR MEDICINE HEPATOBILIARY IMAGING Technique:  Sequential images of the abdomen were obtained out to 60 minutes following intravenous administration of radiopharmaceutical. Radiopharmaceutical:  5.0 mCi Tc-12m Choletec Comparison:  08/05/2010 ultrasound Findings: Homogeneous hepatic uptake is identified.  The inferior and left portion of the liver are enlarged. The study was taken out to 3 hours but no biliary, gallbladder or bowel activity is identified. IMPRESSION: Absence of biliary, gallbladder and bowel activity at 3 hours. Differential includes acute CBD obstruction versus cholestatic hepatitis. Acute CBD obstruction is slightly favored as hepatic activity is usually more diminished with hepatitis. Hepatomegaly. These results were called to Dr. Butler Denmark on 08/06/2010 at 11:25 AM. Original Report Authenticated By: Rosendo Gros, M.D.  US Abdomen Complete  Result Date: 08/05/2010 *RADIOLOGY REPORT* Clinical Data:  Epigastric pain, elevated LFTs, question CBD obstruction versus gallbladder disease COMPLETE ABDOMINAL ULTRASOUND Comparison:  None. Findings: Gallbladder:  Mild gallbladder wall thickening, measuring 5 mm. Mobile gallstone in the fundus.  No definite pericholecystic fluid. Negative sonographic Murphy's sign. Common bile duct:  Measures 4 mm Liver:  No focal lesion identified. Within normal limits in parenchymal echogenicity. No  intrahepatic ductal dilatation. IVC:  Appears normal. Pancreas:  Incompletely visualized, unremarkable. Spleen:  Measures 8.4 cm Right Kidney:  Measures 9.5 cm.  No mass or hydronephrosis. Left Kidney:  Measures 9.0 cm.  No mass or hydronephrosis. Abdominal aorta:  No aneurysm identified. IMPRESSION: Mild gallbladder wall thickening, measuring 5 mm.  Mobile gallstone in the fundus.  Negative sonographic Murphy's sign. These findings are  equivocal, but unlikely to represent acute cholecystitis.  If clinically warranted, consider hepatobiliary nuclear medicine scan for further characterization. Original Report Authenticated By: Charline Bills, M.D.  Mr 3d Recon At Scanner  Result Date: 08/06/2010 *RADIOLOGY REPORT* Clinical Data: Epigastric pain.  Cholelithiasis. MRI ABDOMEN WITHOUT AND WITH CONTRAST (MRCP) Technique:  Multiplanar multisequence MR imaging of the abdomen was performed without and with contrast, including heavily T2-weighted images of the biliary and pancreatic ducts.  Three-dimensional MR images were rendered by post processing of the original MR data. Contrast: 7 ml Eovist, a contrast agent with partial hepatobiliary excretion. Comparison:  Abdominal ultrasound 08/05/2010. Findings:  There are dependent gallstones within the gallbladder lumen.  The gallbladder is well-distended and shows no wall thickening or surrounding inflammation.  There is no intra or extrahepatic biliary dilatation.  There is no evidence of choledocholithiasis.  The cystic duct inserts very low on the common bile duct, just above the pancreatic head. The pancreatic duct is normal in caliber.  There is no pancreas divisum.  There is no evidence of pancreatic mass or surrounding inflammation. The liver demonstrates no focal abnormality or suspicious enhancement.  The spleen and adrenal glands appear normal.  There is a small cyst in the upper pole of the left kidney.  No enlarged abdominal lymph nodes are identified.  There  is a convex left lumbar scoliosis with associated moderate spondylosis. IMPRESSION: 1.  Cholelithiasis without evidence of cholecystitis, biliary dilatation or choledocholithiasis.  Of note, the cystic duct has a very low insertion on the common bile duct. 2.  No evidence of pancreatitis or pancreas divisum. 3.  No acute abdominal findings identified. Original Report Authenticated By: Gerrianne Scale, M.D.  Mr Roe Coombs W/wo Cm/mrcp  Result Date: 08/06/2010 *RADIOLOGY REPORT* Clinical Data: Epigastric pain.  Cholelithiasis. MRI ABDOMEN WITHOUT AND WITH CONTRAST (MRCP) Technique:  Multiplanar multisequence MR imaging of the abdomen was performed without and with contrast, including heavily T2-weighted images of the biliary and pancreatic ducts.  Three-dimensional MR images were rendered by post processing of the original MR data. Contrast: 7 ml Eovist, a contrast agent with partial hepatobiliary excretion. Comparison:  Abdominal ultrasound 08/05/2010. Findings:  There are dependent gallstones within the gallbladder lumen.  The gallbladder is well-distended and shows no wall thickening or surrounding inflammation.  There is no intra or extrahepatic biliary dilatation.  There is no evidence of choledocholithiasis.  The cystic duct inserts very low on the common bile duct, just above the pancreatic head. The pancreatic duct is normal in caliber.  There is no pancreas divisum.  There is no evidence of pancreatic mass or surrounding inflammation. The liver demonstrates no focal abnormality or suspicious enhancement.  The spleen and adrenal glands appear normal.  There is a small cyst in the upper pole of the left kidney.  No enlarged abdominal lymph nodes are identified.  There is a convex left lumbar scoliosis with associated moderate spondylosis. IMPRESSION: 1.  Cholelithiasis without evidence of cholecystitis, biliary dilatation or choledocholithiasis.  Of note, the cystic duct has a very low insertion on the common  bile duct. 2.  No evidence of pancreatitis or pancreas divisum. 3.  No acute abdominal findings identified. Original Report Authenticated By: Gerrianne Scale, M.D.   Assessment/Plan 1. Essential hypertension Off all medicines blood pressures have been within normal limits  2. Slow transit constipation Given MiraLAX as needed for constipation  3. Advanced dementia [facility]) Patient is pleasantly confused without behavioral problems.  Does take Aricept probably with little effect  at this point  4. Abnormality of gait No recent falls.  Ambulates via wheelchair   Family/ staff Communication: Findings communicated to nursing staff  Labs/tests ordered:  Bertram Millard. Hyacinth Meeker, MD [facility] [address] [place], [place] [address] Office [phone]

## 2018-04-24 ENCOUNTER — Encounter: Payer: Self-pay | Admitting: Nurse Practitioner

## 2018-04-24 ENCOUNTER — Non-Acute Institutional Stay (SKILLED_NURSING_FACILITY): Payer: Medicare Other | Admitting: Nurse Practitioner

## 2018-04-24 DIAGNOSIS — F0390 Unspecified dementia without behavioral disturbance: Secondary | ICD-10-CM | POA: Diagnosis not present

## 2018-04-24 DIAGNOSIS — K219 Gastro-esophageal reflux disease without esophagitis: Secondary | ICD-10-CM | POA: Diagnosis not present

## 2018-04-24 DIAGNOSIS — K5901 Slow transit constipation: Secondary | ICD-10-CM

## 2018-04-24 DIAGNOSIS — F03C0 Unspecified dementia, severe, without behavioral disturbance, psychotic disturbance, mood disturbance, and anxiety: Secondary | ICD-10-CM

## 2018-04-24 NOTE — Progress Notes (Signed)
Location:  [facility] Room Number: 111 Place of Service:  SNF (31) Provider:  Shamra Bradeen, Arna Snipe  NP  Americus Scheurich X, NP  Patient Care Team: Marely Apgar X, NP as PCP - General (Internal Medicine) [place] Yasaman Kolek X, NP as Nurse Practitioner (Internal Medicine)  Extended Emergency Contact Information Primary Emergency Contact: Kratochvil,Ron Address: [address]          [place], [place] [address] [place] Home Phone: [phone] Relation: Son Secondary Emergency Contact: [name],[name] Address: [address]          [place], [place] [address] [place] Home Phone: [phone] Relation: Daughter  Code Status:  DNR Goals of care: Advanced Directive information Advanced Directives 04/25/2018  Does Patient Have a Medical Advance Directive? Yes  Type of Estate agent of [place];Out of facility DNR (pink MOST or yellow form)  Does patient want to make changes to medical advance directive? No - Patient declined  Copy of Healthcare Power of Attorney in Chart? Yes - validated most recent copy scanned in chart (See row information)  Pre-existing out of facility DNR order (yellow form or pink MOST form) Yellow form placed in chart (order not valid for inpatient use)     Chief Complaint  Patient presents with  . Medical Management of Chronic Issues    HPI:  Pt is a 85 y.o. female seen today for medical management of chronic diseases.     The patient has history of constipation, stable while taking MiraLax daily. GERD stable on Tums 500mg  tid. She resides in memory care unit [facility] for safety and care assistance, her weights are stable, she is in her usual state of health, on Donepezil 10mg  qd for memory.  Past Medical History:  Diagnosis Date  . Abnormal mammogram, unspecified 10/25/2006  . Abnormality of gait 03/02/2011  . Allergic rhinitis due to pollen 07/13/2006  . Blood in stool 06/29/2011  . Candidiasis  07/01/2012  . Cholelithiasis 08/02/2010  . Disturbance of skin sensation 11/25/2009  . Diverticulitis of colon (without mention of hemorrhage)(562.11) 10/28/2003  . Dizziness and giddiness 07/13/2006  . Edema 07/19/2006  . Lumbago 12/22/2009  . Memory loss 02/17/2010  . Osteoarthrosis, unspecified whether generalized or localized, unspecified site 07/19/2006  . Other and unspecified hyperlipidemia 03/28/2007  . Pain in joint, upper arm 10/25/2006  . Personal history of fall 12/22/2009  . Unspecified constipation 09/26/2007  . Unspecified essential hypertension 07/19/2006  . Unspecified vitamin D deficiency 08/19/2009  . Uterine prolapse without mention of vaginal wall prolapse 06/29/2011   Past Surgical History:  Procedure Laterality Date  . CATARACT EXTRACTION W/ INTRAOCULAR LENS  IMPLANT, BILATERAL Bilateral 1999    Allergies  Allergen Reactions  . Lopid [Gemfibrozil]   . Sulfa Antibiotics   . Zocor [Simvastatin]     Outpatient Encounter Medications as of 04/24/2018  Medication Sig  . acetaminophen (TYLENOL) 325 MG tablet Take 650 mg by mouth every 6 (six) hours as needed for mild pain.   . calcium carbonate (TUMS) 500 MG chewable tablet Chew 3 tablets by mouth daily.   . Cholecalciferol (VITAMIN D) 1000 UNITS capsule Take 2,000 Units by mouth daily.   Marland Kitchen donepezil (ARICEPT) 10 MG tablet One daily to help preserve memory.  . polyethylene glycol (MIRALAX / GLYCOLAX) packet Take 17 g by mouth daily.   No facility-administered encounter medications on file as of 04/24/2018.    ROS was provided with assistance of  staff Review of Systems  Constitutional: Negative for activity change, appetite change, chills, diaphoresis, fatigue, fever and unexpected weight change.  HENT: Positive for hearing loss. Negative for congestion and voice change.   Respiratory: Negative for cough, shortness of breath and wheezing.   Cardiovascular: Positive for leg swelling. Negative for chest pain and palpitations.    Gastrointestinal: Negative for abdominal distention, abdominal pain, constipation, diarrhea, nausea and vomiting.  Genitourinary: Negative for difficulty urinating, dysuria and urgency.       Incontinent of urine  Musculoskeletal: Positive for arthralgias, gait problem and myalgias.  Skin: Negative for color change and pallor.  Neurological: Negative for dizziness, speech difficulty and headaches.       Dementia  Psychiatric/Behavioral: Positive for confusion. Negative for agitation, behavioral problems, hallucinations and sleep disturbance. The patient is not nervous/anxious.     Immunization History  Administered Date(s) Administered  . Influenza Whole 01/16/2012, 02/20/2013, 01/17/2018  . Influenza-Unspecified 02/19/2014, 01/05/2015, 02/01/2016  . PPD Test 08/23/2010, 08/04/2015  . Pneumococcal Polysaccharide-23 06/24/1998  . Td 05/02/1974   Pertinent  Health Maintenance Due  Topic Date Due  . DEXA SCAN  03/19/1986  . PNA vac Low Risk Adult (2 of 2 - PCV13) 06/24/1999  . INFLUENZA VACCINE  Completed   Fall Risk  12/15/2016 08/06/2015 08/06/2015 11/19/2014 11/19/2014  Falls in the past year? No No No Yes No  Number falls in past yr: - - - 1 -  Injury with Fall? - - - No -   Functional Status Survey:    Vitals:   04/25/18 1006  BP: 120/68  Pulse: 85  Resp: 20  Temp: 97.9 F (36.6 C)  SpO2: 94%  Weight: 167 lb 3.2 oz (75.8 kg)  Height: 5\' 3"  (1.6 m)   Body mass index is 29.62 kg/m. Physical Exam Constitutional:      General: She is not in acute distress.    Appearance: Normal appearance. She is not ill-appearing, toxic-appearing or diaphoretic.     Comments: overweight  HENT:     Head: Normocephalic and atraumatic.     Nose: Nose normal.     Mouth/Throat:     Mouth: Mucous membranes are moist.  Eyes:     Extraocular Movements: Extraocular movements intact.     Pupils: Pupils are equal, round, and reactive to light.  Cardiovascular:     Rate and Rhythm: Normal  rate and regular rhythm.     Heart sounds: No murmur.  Pulmonary:     Effort: Pulmonary effort is normal.     Breath sounds: No wheezing, rhonchi or rales.  Abdominal:     General: There is no distension.     Tenderness: There is no abdominal tenderness. There is no guarding or rebound.  Musculoskeletal:     Right lower leg: Edema present.     Left lower leg: Edema present.     Comments: Trace edema LLE>RLE. Ambulates with walker, w/c to go further  Skin:    General: Skin is warm and dry.     Coloration: Skin is not pale.     Findings: No erythema.  Neurological:     General: No focal deficit present.     Mental Status: She is alert. Mental status is at baseline.     Motor: No weakness.     Coordination: Coordination normal.     Gait: Gait abnormal.     Comments: Oriented to person and her room on unit.   Psychiatric:  Mood and Affect: Mood normal.        Behavior: Behavior normal.     Labs reviewed: Recent Labs    09/20/17  NA 140  K 4.1  CL 103  CO2 31  BUN 24*  CREATININE 1.2*  CALCIUM 9.2   Recent Labs    09/20/17  AST 23  ALT 22  ALKPHOS 104  PROT 6.3  ALBUMIN 3.7   Recent Labs    09/20/17  WBC 7.3  HGB 14.6  HCT 43  PLT 246   Lab Results  Component Value Date   TSH 2.42 09/20/2017   Lab Results  Component Value Date   HGBA1C 5.7 03/10/2014   Lab Results  Component Value Date   CHOL 297 (A) 04/01/2013   HDL 54 04/01/2013   LDLCALC 200 04/01/2013   TRIG 214 (A) 04/01/2013    Significant Diagnostic Results in last 30 days:  No results found.  Assessment/Plan GERD (gastroesophageal reflux disease) Stable, continue TUMS 500mg  tid.   Advanced dementia Stable, continue memory care unit for safety and care assistance, continue Donepezil 10mg  qd for memory.   Slow transit constipation Stable, continue MiraLax daily.      Family/ staff Communication: plan of care reviewed with the patient and charge nurse  Labs/tests ordered:  none  Time spend 25 minutes.

## 2018-04-24 NOTE — Assessment & Plan Note (Signed)
Stable, continue memory care unit for safety and care assistance, continue Donepezil 10mg  qd for memory.

## 2018-04-24 NOTE — Assessment & Plan Note (Signed)
Stable, continue TUMS 500mg  tid.

## 2018-04-24 NOTE — Assessment & Plan Note (Signed)
Stable, continue MiraLax daily.  

## 2018-04-25 ENCOUNTER — Encounter: Payer: Self-pay | Admitting: Nurse Practitioner

## 2018-04-29 ENCOUNTER — Encounter: Payer: Self-pay | Admitting: Nurse Practitioner

## 2018-04-29 ENCOUNTER — Non-Acute Institutional Stay (SKILLED_NURSING_FACILITY): Payer: Medicare Other | Admitting: Nurse Practitioner

## 2018-04-29 DIAGNOSIS — F03C0 Unspecified dementia, severe, without behavioral disturbance, psychotic disturbance, mood disturbance, and anxiety: Secondary | ICD-10-CM

## 2018-04-29 DIAGNOSIS — R05 Cough: Secondary | ICD-10-CM

## 2018-04-29 DIAGNOSIS — F0390 Unspecified dementia without behavioral disturbance: Secondary | ICD-10-CM | POA: Diagnosis not present

## 2018-04-29 DIAGNOSIS — R059 Cough, unspecified: Secondary | ICD-10-CM

## 2018-04-29 NOTE — Assessment & Plan Note (Signed)
Reportedly cough during meals, will ST to evaluate and possible diet modification, VS and Sat O2 q shift x 72 hours, monitor for cough/wheezing episodes. Continue Robafen AC 10mg  q6h prn for symptomatic management.

## 2018-04-29 NOTE — Progress Notes (Signed)
Location:  [facility] Room Number: 111 Place of Service:  SNF (31) Provider:  Daveigh Batty, Arna SnipeManXie  NP  Danyah Guastella X, NP  Patient Care Team: Lenola Lockner X, NP as PCP - General (Internal Medicine) [place] Jonh Mcqueary X, NP as Nurse Practitioner (Internal Medicine)  Extended Emergency Contact Information Primary Emergency Contact: Phagan,Ron Address: [address]          [place], [place] [address] [place] Home Phone: [phone] Relation: Son Secondary Emergency Contact: [name],[name] Address: [address]          [place], [place] [address] [place] Home Phone: [phone] Relation: Daughter  Code Status:  DNR Goals of care: Advanced Directive information Advanced Directives 04/25/2018  Does Patient Have a Medical Advance Directive? Yes  Type of Estate agentAdvance Directive Healthcare Power of [place];Out of facility DNR (pink MOST or yellow form)  Does patient want to make changes to medical advance directive? No - Patient declined  Copy of Healthcare Power of Attorney in Chart? Yes - validated most recent copy scanned in chart (See row information)  Pre-existing out of facility DNR order (yellow form or pink MOST form) Yellow form placed in chart (order not valid for inpatient use)     Chief Complaint  Patient presents with  . Acute Visit    C/O- cold symptoms- wheezing and cough    HPI:  Pt is a 85 y.o. female seen today for an acute visit for    The patient was reported non productive cough with expiratory wheezing 04/25/18. Staff today stated the patient was noted coughing during meals. No wheezing upon my visit today. She is afebrile, no O2 desaturation. She is in her usual state of health. No noted malaise, SOB, chest pain/pressure, or palpitation. Prn Robafen AC 10mg  q6h prn available to her.  HPI was provided with assistance of staff due there dementia. She resides in memory care unit [facility], on Donepezil 10mg  qd.  Past  Medical History:  Diagnosis Date  . Abnormal mammogram, unspecified 10/25/2006  . Abnormality of gait 03/02/2011  . Allergic rhinitis due to pollen 07/13/2006  . Blood in stool 06/29/2011  . Candidiasis 07/01/2012  . Cholelithiasis 08/02/2010  . Disturbance of skin sensation 11/25/2009  . Diverticulitis of colon (without mention of hemorrhage)(562.11) 10/28/2003  . Dizziness and giddiness 07/13/2006  . Edema 07/19/2006  . Lumbago 12/22/2009  . Memory loss 02/17/2010  . Osteoarthrosis, unspecified whether generalized or localized, unspecified site 07/19/2006  . Other and unspecified hyperlipidemia 03/28/2007  . Pain in joint, upper arm 10/25/2006  . Personal history of fall 12/22/2009  . Unspecified constipation 09/26/2007  . Unspecified essential hypertension 07/19/2006  . Unspecified vitamin D deficiency 08/19/2009  . Uterine prolapse without mention of vaginal wall prolapse 06/29/2011   Past Surgical History:  Procedure Laterality Date  . CATARACT EXTRACTION W/ INTRAOCULAR LENS  IMPLANT, BILATERAL Bilateral 1999    Allergies  Allergen Reactions  . Lopid [Gemfibrozil]   . Sulfa Antibiotics   . Zocor [Simvastatin]     Outpatient Encounter Medications as of 04/29/2018  Medication Sig  . acetaminophen (TYLENOL) 325 MG tablet Take 650 mg by mouth every 6 (six) hours as needed for mild pain.   . calcium carbonate (TUMS) 500 MG chewable tablet Chew 3 tablets by mouth daily.   . Cholecalciferol (VITAMIN D) 1000 UNITS capsule Take 2,000 Units by mouth daily.   Marland Kitchen. donepezil (ARICEPT) 10 MG tablet One daily to  help preserve memory.  Marland Kitchen guaiFENesin-codeine (ROBAFEN AC) 100-10 MG/5ML syrup Take 10 mLs by mouth every 6 (six) hours as needed for cough.  . polyethylene glycol (MIRALAX / GLYCOLAX) packet Take 17 g by mouth daily.   No facility-administered encounter medications on file as of 04/29/2018.    ROS was provided with assistance of staff Review of Systems  Constitutional: Negative for activity change,  appetite change, chills, diaphoresis, fatigue, fever and unexpected weight change.  HENT: Positive for hearing loss. Negative for congestion, postnasal drip, sinus pressure, sinus pain, sore throat, trouble swallowing and voice change.   Respiratory: Positive for cough. Negative for apnea, choking, chest tightness, shortness of breath and wheezing.        During meals.   Cardiovascular: Positive for leg swelling. Negative for chest pain and palpitations.  Neurological: Negative for dizziness, facial asymmetry, speech difficulty, weakness and headaches.       Dementia  Psychiatric/Behavioral: Negative for agitation, behavioral problems, hallucinations and sleep disturbance. The patient is not nervous/anxious.     Immunization History  Administered Date(s) Administered  . Influenza Whole 01/16/2012, 02/20/2013, 01/17/2018  . Influenza-Unspecified 02/19/2014, 01/05/2015, 02/01/2016  . PPD Test 08/23/2010, 08/04/2015  . Pneumococcal Polysaccharide-23 06/24/1998  . Td 05/02/1974   Pertinent  Health Maintenance Due  Topic Date Due  . DEXA SCAN  03/19/1986  . PNA vac Low Risk Adult (2 of 2 - PCV13) 06/24/1999  . INFLUENZA VACCINE  Completed   Fall Risk  12/15/2016 08/06/2015 08/06/2015 11/19/2014 11/19/2014  Falls in the past year? No No No Yes No  Number falls in past yr: - - - 1 -  Injury with Fall? - - - No -   Functional Status Survey:    Vitals:   04/29/18 1201  BP: 110/72  Pulse: 72  Resp: 18  Temp: 98.4 F (36.9 C)  SpO2: 94%  Weight: 167 lb 3.2 oz (75.8 kg)  Height: 5\' 3"  (1.6 m)   Body mass index is 29.62 kg/m. Physical Exam Constitutional:      General: She is not in acute distress.    Appearance: Normal appearance. She is not ill-appearing, toxic-appearing or diaphoretic.  HENT:     Head: Normocephalic and atraumatic.     Nose: Nose normal. No congestion or rhinorrhea.     Mouth/Throat:     Mouth: Mucous membranes are moist.     Pharynx: No oropharyngeal exudate or  posterior oropharyngeal erythema.  Eyes:     Extraocular Movements: Extraocular movements intact.     Pupils: Pupils are equal, round, and reactive to light.  Neck:     Musculoskeletal: Normal range of motion and neck supple.  Cardiovascular:     Rate and Rhythm: Normal rate and regular rhythm.     Heart sounds: No murmur.  Pulmonary:     Effort: Pulmonary effort is normal.     Breath sounds: Rales present. No wheezing or rhonchi.     Comments: Posterior bibasilar fine crackles.  Musculoskeletal:     Right lower leg: Edema present.     Left lower leg: Edema present.     Comments: Trace edema BLE, chronic, no change. Ambulates with walker.   Skin:    General: Skin is warm and dry.  Neurological:     General: No focal deficit present.     Mental Status: She is alert. Mental status is at baseline.     Comments: Oriented to self and her room on unit.   Psychiatric:  Mood and Affect: Mood normal.        Behavior: Behavior normal.     Labs reviewed: Recent Labs    09/20/17  NA 140  K 4.1  CL 103  CO2 31  BUN 24*  CREATININE 1.2*  CALCIUM 9.2   Recent Labs    09/20/17  AST 23  ALT 22  ALKPHOS 104  PROT 6.3  ALBUMIN 3.7   Recent Labs    09/20/17  WBC 7.3  HGB 14.6  HCT 43  PLT 246   Lab Results  Component Value Date   TSH 2.42 09/20/2017   Lab Results  Component Value Date   HGBA1C 5.7 03/10/2014   Lab Results  Component Value Date   CHOL 297 (A) 04/01/2013   HDL 54 04/01/2013   LDLCALC 200 04/01/2013   TRIG 214 (A) 04/01/2013    Significant Diagnostic Results in last 30 days:  No results found.  Assessment/Plan Cough Reportedly cough during meals, will ST to evaluate and possible diet modification, VS and Sat O2 q shift x 72 hours, monitor for cough/wheezing episodes. Continue Robafen AC 10mg  q6h prn for symptomatic management.   Advanced dementia [facility] for [facility] and care assistance. Continue Donepezil 10mg  qd for  memory.      Family/ staff Communication: plan of care reviewed with the patient and charge nurse.   Labs/tests ordered: none  Time spend 25 minutes.

## 2018-04-29 NOTE — Assessment & Plan Note (Signed)
Continue memory care unit [facility] for safety and care assistance. Continue Donepezil 10mg  qd for memory.

## 2018-05-27 ENCOUNTER — Non-Acute Institutional Stay (SKILLED_NURSING_FACILITY): Payer: Medicare Other | Admitting: Nurse Practitioner

## 2018-05-27 ENCOUNTER — Encounter: Payer: Self-pay | Admitting: Nurse Practitioner

## 2018-05-27 DIAGNOSIS — H1132 Conjunctival hemorrhage, left eye: Secondary | ICD-10-CM | POA: Diagnosis not present

## 2018-05-27 DIAGNOSIS — K5901 Slow transit constipation: Secondary | ICD-10-CM | POA: Diagnosis not present

## 2018-05-27 DIAGNOSIS — F0390 Unspecified dementia without behavioral disturbance: Secondary | ICD-10-CM

## 2018-05-27 DIAGNOSIS — F03C0 Unspecified dementia, severe, without behavioral disturbance, psychotic disturbance, mood disturbance, and anxiety: Secondary | ICD-10-CM

## 2018-05-27 NOTE — Progress Notes (Signed)
Location:  [facility] Room Number: 111 Place of Service:  SNF (31) Provider:  Finnis Colee, Arna Snipe  NP  Rodert Hinch X, NP  Patient Care Team: Jowell Bossi X, NP as PCP - General (Internal Medicine) [place] Mccrae Speciale X, NP as Nurse Practitioner (Internal Medicine)  Extended Emergency Contact Information Primary Emergency Contact: Clemenson,Ron Address: [address]          [place], [place] [address] [place] Home Phone: [phone] Relation: Son Secondary Emergency Contact: [name],[name] Address: [address]          [place], [place] [address] [place] Home Phone: [phone] Relation: Daughter  Code Status:  DNR Goals of care: Advanced Directive information Advanced Directives 05/27/2018  Does Patient Have a Medical Advance Directive? Yes  Type of Estate agent of [place];Out of facility DNR (pink MOST or yellow form)  Does patient want to make changes to medical advance directive? No - Patient declined  Copy of Healthcare Power of Attorney in Chart? Yes - validated most recent copy scanned in chart (See row information)  Pre-existing out of facility DNR order (yellow form or pink MOST form) Yellow form placed in chart (order not valid for inpatient use)     Chief Complaint  Patient presents with  . Medical Management of Chronic Issues    HPI:  Pt is a 85 y.o. female seen today for medical management of chronic diseases.    The patient was noted to have the left medial aspect conjunctival redness, slightly watery, no change of vision, pain, or itching noted. HPI was provided with assistance of staff. She resides in [facility] for safety and care assistance. On Donepezil 10mg  for memory. No constipation while on MiraLax qd.   Past Medical History:  Diagnosis Date  . Abnormal mammogram, unspecified 10/25/2006  . Abnormality of gait 03/02/2011  . Allergic rhinitis due to pollen 07/13/2006  . Blood  in stool 06/29/2011  . Candidiasis 07/01/2012  . Cholelithiasis 08/02/2010  . Disturbance of skin sensation 11/25/2009  . Diverticulitis of colon (without mention of hemorrhage)(562.11) 10/28/2003  . Dizziness and giddiness 07/13/2006  . Edema 07/19/2006  . Lumbago 12/22/2009  . Memory loss 02/17/2010  . Osteoarthrosis, unspecified whether generalized or localized, unspecified site 07/19/2006  . Other and unspecified hyperlipidemia 03/28/2007  . Pain in joint, upper arm 10/25/2006  . Personal history of fall 12/22/2009  . Unspecified constipation 09/26/2007  . Unspecified essential hypertension 07/19/2006  . Unspecified vitamin D deficiency 08/19/2009  . Uterine prolapse without mention of vaginal wall prolapse 06/29/2011   Past Surgical History:  Procedure Laterality Date  . CATARACT EXTRACTION W/ INTRAOCULAR LENS  IMPLANT, BILATERAL Bilateral 1999    Allergies  Allergen Reactions  . Lopid [Gemfibrozil]   . Sulfa Antibiotics   . Zocor [Simvastatin]     Outpatient Encounter Medications as of 05/27/2018  Medication Sig  . acetaminophen (TYLENOL) 325 MG tablet Take 650 mg by mouth every 6 (six) hours as needed for mild pain.   . calcium carbonate (TUMS) 500 MG chewable tablet Chew 3 tablets by mouth daily.   . Cholecalciferol (VITAMIN D) 1000 UNITS capsule Take 2,000 Units by mouth daily.   Marland Kitchen donepezil (ARICEPT) 10 MG tablet One daily to help preserve memory.  . polyethylene glycol (MIRALAX / GLYCOLAX) packet Take 17 g by mouth daily.  . [DISCONTINUED] guaiFENesin-codeine (ROBAFEN AC) 100-10 MG/5ML syrup Take 10 mLs by mouth every 6 (six)  hours as needed for cough.   No facility-administered encounter medications on file as of 05/27/2018.    ROS was provided with assistance of staff Review of Systems  Constitutional: Negative for activity change, appetite change, chills, diaphoresis, fatigue, fever and unexpected weight change.  HENT: Positive for hearing loss. Negative for congestion, sneezing,  sore throat and voice change.   Respiratory: Negative for cough, shortness of breath and wheezing.   Cardiovascular: Positive for leg swelling. Negative for chest pain and palpitations.  Gastrointestinal: Negative for abdominal distention, abdominal pain, constipation, diarrhea, nausea and vomiting.  Genitourinary: Negative for difficulty urinating, dysuria and urgency.  Musculoskeletal: Positive for gait problem.  Skin: Negative for color change and pallor.  Neurological: Negative for dizziness, speech difficulty, weakness and headaches.       Dementia  Psychiatric/Behavioral: Positive for confusion. Negative for agitation, behavioral problems, hallucinations and sleep disturbance. The patient is not nervous/anxious.     Immunization History  Administered Date(s) Administered  . Influenza Whole 01/16/2012, 02/20/2013, 01/17/2018  . Influenza-Unspecified 02/19/2014, 01/05/2015, 02/01/2016  . PPD Test 08/23/2010, 08/04/2015  . Pneumococcal Polysaccharide-23 06/24/1998  . Td 05/02/1974   Pertinent  Health Maintenance Due  Topic Date Due  . DEXA SCAN  03/19/1986  . PNA vac Low Risk Adult (2 of 2 - PCV13) 06/24/1999  . INFLUENZA VACCINE  Completed   Fall Risk  12/15/2016 08/06/2015 08/06/2015 11/19/2014 11/19/2014  Falls in the past year? No No No Yes No  Number falls in past yr: - - - 1 -  Injury with Fall? - - - No -   Functional Status Survey:    Vitals:   05/27/18 1553  BP: 140/80  Pulse: 69  Resp: 18  Temp: 98 F (36.7 C)  SpO2: 91%  Weight: 165 lb 3.2 oz (74.9 kg)  Height: 5\' 3"  (1.6 m)   Body mass index is 29.26 kg/m. Physical Exam Constitutional:      General: She is not in acute distress.    Appearance: Normal appearance. She is not ill-appearing, toxic-appearing or diaphoretic.     Comments: Over weight  HENT:     Head: Normocephalic and atraumatic.     Nose: Nose normal. No congestion or rhinorrhea.     Mouth/Throat:     Mouth: Mucous membranes are moist.    Eyes:     Extraocular Movements: Extraocular movements intact.     Pupils: Pupils are equal, round, and reactive to light.     Comments: Medial left conjunctival redness, slightly watery in the left eye.   Neck:     Musculoskeletal: Normal range of motion.  Cardiovascular:     Rate and Rhythm: Normal rate and regular rhythm.     Heart sounds: No murmur.  Pulmonary:     Effort: Pulmonary effort is normal.     Breath sounds: No wheezing, rhonchi or rales.  Abdominal:     General: Bowel sounds are normal.     Tenderness: There is no abdominal tenderness. There is no guarding or rebound.  Musculoskeletal:     Right lower leg: Edema present.     Left lower leg: Edema present.     Comments: Trace edema BLE  Skin:    General: Skin is warm and dry.  Neurological:     General: No focal deficit present.     Mental Status: She is alert. Mental status is at baseline.     Cranial Nerves: No cranial nerve deficit.     Motor: No weakness.  Coordination: Coordination normal.     Gait: Gait abnormal.     Comments: Oriented to self.   Psychiatric:        Mood and Affect: Mood normal.        Behavior: Behavior normal.     Labs reviewed: Recent Labs    09/20/17  NA 140  K 4.1  CL 103  CO2 31  BUN 24*  CREATININE 1.2*  CALCIUM 9.2   Recent Labs    09/20/17  AST 23  ALT 22  ALKPHOS 104  PROT 6.3  ALBUMIN 3.7   Recent Labs    09/20/17  WBC 7.3  HGB 14.6  HCT 43  PLT 246   Lab Results  Component Value Date   TSH 2.42 09/20/2017   Lab Results  Component Value Date   HGBA1C 5.7 03/10/2014   Lab Results  Component Value Date   CHOL 297 (A) 04/01/2013   HDL 54 04/01/2013   LDLCALC 200 04/01/2013   TRIG 214 (A) 04/01/2013    Significant Diagnostic Results in last 30 days:  No results found.  Assessment/Plan Subconjunctival bleed, left Medial aspect of the left eye, will apply artificial tear Igtt q4hr x 72 hours. Observe.   Advanced dementia Continue SNF  [facility] for safety and care assistance. Continue Donepezil 10mg  qd for memory.   Slow transit constipation Stable, continue MiraLax qd.      Family/ staff Communication: plan of care reviewed with the patient and charge nurse.   Labs/tests ordered:  none  Time spend 25 minutes

## 2018-05-27 NOTE — Assessment & Plan Note (Signed)
Stable, continue MiraLax qd.  

## 2018-05-27 NOTE — Assessment & Plan Note (Signed)
Continue SNF [facility] for safety and care assistance. Continue Donepezil 10mg  qd for memory.

## 2018-05-27 NOTE — Assessment & Plan Note (Signed)
Medial aspect of the left eye, will apply artificial tear Igtt q4hr x 72 hours. Observe.

## 2018-07-24 ENCOUNTER — Encounter: Payer: Self-pay | Admitting: Internal Medicine

## 2018-07-24 ENCOUNTER — Non-Acute Institutional Stay (SKILLED_NURSING_FACILITY): Payer: Medicare Other | Admitting: Internal Medicine

## 2018-07-24 DIAGNOSIS — F0390 Unspecified dementia without behavioral disturbance: Secondary | ICD-10-CM | POA: Diagnosis not present

## 2018-07-24 DIAGNOSIS — N183 Chronic kidney disease, stage 3 unspecified: Secondary | ICD-10-CM

## 2018-07-24 DIAGNOSIS — K219 Gastro-esophageal reflux disease without esophagitis: Secondary | ICD-10-CM

## 2018-07-24 DIAGNOSIS — I10 Essential (primary) hypertension: Secondary | ICD-10-CM

## 2018-07-24 DIAGNOSIS — F03C0 Unspecified dementia, severe, without behavioral disturbance, psychotic disturbance, mood disturbance, and anxiety: Secondary | ICD-10-CM

## 2018-07-24 NOTE — Progress Notes (Signed)
Location:  [facility] Room Number: 30 Place of Service:  SNF [phone]) Provider:  Einar Crow  MD  Mahlon Gammon, MD  Patient Care Team: Mahlon Gammon, MD as PCP - General (Internal Medicine) [place] Mast, Man X, NP as Nurse Practitioner (Internal Medicine)  Extended Emergency Contact Information Primary Emergency Contact: [facility] Address: [address]          [place], [place] [address] [place] Home Phone: [phone] Relation: Son Secondary Emergency Contact: [name],[name] Address: [address]          [place], [place] [address] [place] Home Phone: [phone] Relation: Daughter  Code Status:  DNR Goals of care: Advanced Directive information Advanced Directives 07/24/2018  Does Patient Have a Medical Advance Directive? Yes  Type of Estate agent of [place];Out of facility DNR (pink MOST or yellow form)  Does patient want to make changes to medical advance directive? No - Patient declined  Copy of Healthcare Power of Attorney in Chart? Yes - validated most recent copy scanned in chart (See row information)  Pre-existing out of facility DNR order (yellow form or pink MOST form) Yellow form placed in chart (order not valid for inpatient use)     Chief Complaint  Patient presents with  . Medical Management of Chronic Issues    HPI:  Pt is a 85 y.o. female seen today for medical management of chronic diseases.  Patient has h/o Dementia, Hypertension and GERd She is long term resident of facility. Per nurses she is stable No New complains Her appetite is good. Her weight is stable She walks with the Walker and Mild assist      Past Medical History:  Diagnosis Date  . Abnormal mammogram, unspecified 10/25/2006  . Abnormality of gait 03/02/2011  . Allergic rhinitis due to pollen 07/13/2006  . Blood in stool 06/29/2011  . Candidiasis 07/01/2012  . Cholelithiasis  08/02/2010  . Disturbance of skin sensation 11/25/2009  . Diverticulitis of colon (without mention of hemorrhage)(562.11) 10/28/2003  . Dizziness and giddiness 07/13/2006  . Edema 07/19/2006  . Lumbago 12/22/2009  . Memory loss 02/17/2010  . Osteoarthrosis, unspecified whether generalized or localized, unspecified site 07/19/2006  . Other and unspecified hyperlipidemia 03/28/2007  . Pain in joint, upper arm 10/25/2006  . Personal history of fall 12/22/2009  . Unspecified constipation 09/26/2007  . Unspecified essential hypertension 07/19/2006  . Unspecified vitamin D deficiency 08/19/2009  . Uterine prolapse without mention of vaginal wall prolapse 06/29/2011   Past Surgical History:  Procedure Laterality Date  . CATARACT EXTRACTION W/ INTRAOCULAR LENS  IMPLANT, BILATERAL Bilateral 1999    Allergies  Allergen Reactions  . Lopid [Gemfibrozil]   . Sulfa Antibiotics   . Zocor [Simvastatin]     Outpatient Encounter Medications as of 07/24/2018  Medication Sig  . acetaminophen (TYLENOL) 325 MG tablet Take 650 mg by mouth every 6 (six) hours as needed for mild pain.   . calcium carbonate (TUMS) 500 MG chewable tablet Chew 3 tablets by mouth daily.   . Cholecalciferol (VITAMIN D) 1000 UNITS capsule Take 2,000 Units by mouth daily.   Marland Kitchen donepezil (ARICEPT) 10 MG tablet Take 10 mg by mouth at bedtime. To help preserve memory.  . polyethylene glycol (MIRALAX / GLYCOLAX) packet Take 17 g by mouth daily.   No facility-administered encounter medications on file as of 07/24/2018.     Review of Systems  Unable to perform ROS:  Dementia    Immunization History  Administered Date(s) Administered  . Influenza Whole 01/16/2012, 02/20/2013, 01/17/2018  . Influenza-Unspecified 02/19/2014, 01/05/2015, 02/01/2016  . PPD Test 08/23/2010, 08/04/2015  . Pneumococcal Polysaccharide-23 06/24/1998  . Td 05/02/1974   Pertinent  Health Maintenance Due  Topic Date Due  . DEXA SCAN  03/19/1986  . PNA vac Low Risk Adult  (2 of 2 - PCV13) 06/24/1999  . INFLUENZA VACCINE  11/16/2018   Fall Risk  12/15/2016 08/06/2015 08/06/2015 11/19/2014 11/19/2014  Falls in the past year? No No No Yes No  Number falls in past yr: - - - 1 -  Injury with Fall? - - - No -   Functional Status Survey:    Vitals:   07/24/18 0918  BP: 130/70  Pulse: 70  Resp: 20  Temp: 98 F (36.7 C)  SpO2: 94%  Weight: 160 lb 1.6 oz (72.6 kg)  Height: 5\' 3"  (1.6 m)   Body mass index is 28.36 kg/m. Physical Exam Vitals signs reviewed.  Constitutional:      Appearance: Normal appearance.  HENT:     Head: Normocephalic.     Nose: Nose normal.     Mouth/Throat:     Mouth: Mucous membranes are moist.     Pharynx: Oropharynx is clear.  Eyes:     Pupils: Pupils are equal, round, and reactive to light.  Neck:     Musculoskeletal: Neck supple.  Cardiovascular:     Rate and Rhythm: Normal rate and regular rhythm.     Pulses: Normal pulses.     Heart sounds: Normal heart sounds.  Pulmonary:     Effort: Pulmonary effort is normal. No respiratory distress.     Breath sounds: Normal breath sounds. No wheezing or rales.  Abdominal:     General: Abdomen is flat. Bowel sounds are normal. There is no distension.     Palpations: Abdomen is soft.     Tenderness: There is no abdominal tenderness.  Musculoskeletal:        General: No swelling.  Skin:    General: Skin is warm and dry.  Neurological:     General: No focal deficit present.     Mental Status: She is alert.     Comments: Does not answer or follow any commands  Psychiatric:        Mood and Affect: Mood normal.        Thought Content: Thought content normal.        Judgment: Judgment normal.     Labs reviewed: Recent Labs    09/20/17  NA 140  K 4.1  CL 103  CO2 31  BUN 24*  CREATININE 1.2*  CALCIUM 9.2   Recent Labs    09/20/17  AST 23  ALT 22  ALKPHOS 104  PROT 6.3  ALBUMIN 3.7   Recent Labs    09/20/17  WBC 7.3  HGB 14.6  HCT 43  PLT 246   Lab  Results  Component Value Date   TSH 2.42 09/20/2017   Lab Results  Component Value Date   HGBA1C 5.7 03/10/2014   Lab Results  Component Value Date   CHOL 297 (A) 04/01/2013   HDL 54 04/01/2013   LDLCALC 200 04/01/2013   TRIG 214 (A) 04/01/2013    Significant Diagnostic Results in last 30 days:  No results found.  Assessment/Plan Essential hypertension Stable Not on any Meds  Advanced dementia [facility]) Doing well supportive care On Aricept  CKD (chronic kidney disease)  stage 3 Will repeat BMP     Family/ staff Communication:   Labs/tests ordered: CMP and CBC Total time spent in this patient care encounter was 25_ minutes; greater than 50% of the visit spent with staff , reviewing records , Labs and coordinating care for problems addressed at this encounter.

## 2018-08-19 ENCOUNTER — Non-Acute Institutional Stay (SKILLED_NURSING_FACILITY): Payer: Medicare Other | Admitting: Nurse Practitioner

## 2018-08-19 ENCOUNTER — Encounter: Payer: Self-pay | Admitting: Nurse Practitioner

## 2018-08-19 DIAGNOSIS — F03C0 Unspecified dementia, severe, without behavioral disturbance, psychotic disturbance, mood disturbance, and anxiety: Secondary | ICD-10-CM

## 2018-08-19 DIAGNOSIS — F0390 Unspecified dementia without behavioral disturbance: Secondary | ICD-10-CM

## 2018-08-19 DIAGNOSIS — R634 Abnormal weight loss: Secondary | ICD-10-CM | POA: Insufficient documentation

## 2018-08-19 DIAGNOSIS — N189 Chronic kidney disease, unspecified: Secondary | ICD-10-CM

## 2018-08-19 DIAGNOSIS — K5901 Slow transit constipation: Secondary | ICD-10-CM

## 2018-08-19 DIAGNOSIS — R269 Unspecified abnormalities of gait and mobility: Secondary | ICD-10-CM | POA: Diagnosis not present

## 2018-08-19 DIAGNOSIS — N179 Acute kidney failure, unspecified: Secondary | ICD-10-CM

## 2018-08-19 NOTE — Assessment & Plan Note (Signed)
#  12Ibs weight loss in the past month. Update CBC/diff, CMP/eGFR, TSH, f/u dietitian.

## 2018-08-19 NOTE — Progress Notes (Signed)
Location:  [facility] Room Number: 30 Place of Service:  SNF (31) Provider:  Collin Rengel, Lennie Odor  NP  Virgie Dad, MD  Patient Care Team: Virgie Dad, MD as PCP - General (Internal Medicine) [place] Noorah Giammona X, NP as Nurse Practitioner (Internal Medicine)  Extended Emergency Contact Information Primary Emergency Contact: [facility] Address: [address]          [place], [place] [address] [place] Phone: [phone] Relation: Son Secondary Emergency Contact: [name],[name] Address: [address]          [place], [place] [address] [place] Phone: [phone] Relation: Daughter  Code Status:  DNR Goals of care: Advanced Directive information Advanced Directives 08/19/2018  Does Patient Have a Medical Advance Directive? Yes  Type of Paramedic of [place];Out of facility DNR (pink MOST or yellow form)  Does patient want to make changes to medical advance directive? No - Patient declined  Copy of [place] in Chart? Yes - validated most recent copy scanned in chart (See row information)  Pre-existing out of facility DNR order (yellow form or pink MOST form) Yellow form placed in chart (order not valid for inpatient use)     Chief Complaint  Patient presents with  . Medical Management of Chronic Issues    HPI:  Pt is a 85 y.o. female seen today for medical management of chronic diseases.    The patient resides in SNF [facility] for safety and care assistance, on Donepezil 18m qd for memory. No constipation while taking MiraLax qd. She needs assistance for transfer, w/c for mobility due to unsteady gait.    Past Medical History:  Diagnosis Date  . Abnormal mammogram, unspecified 10/25/2006  . Abnormality of gait 03/02/2011  . Allergic rhinitis due to pollen 07/13/2006  . Blood in stool 06/29/2011  . Candidiasis 07/01/2012  . Cholelithiasis 08/02/2010  .  Disturbance of skin sensation 11/25/2009  . Diverticulitis of colon (without mention of hemorrhage)(562.11) 10/28/2003  . Dizziness and giddiness 07/13/2006  . Edema 07/19/2006  . Lumbago 12/22/2009  . Memory loss 02/17/2010  . Osteoarthrosis, unspecified whether generalized or localized, unspecified site 07/19/2006  . Other and unspecified hyperlipidemia 03/28/2007  . Pain in joint, upper arm 10/25/2006  . Personal history of fall 12/22/2009  . Unspecified constipation 09/26/2007  . Unspecified essential hypertension 07/19/2006  . Unspecified vitamin D deficiency 08/19/2009  . Uterine prolapse without mention of vaginal wall prolapse 06/29/2011   Past Surgical History:  Procedure Laterality Date  . CATARACT EXTRACTION W/ INTRAOCULAR LENS  IMPLANT, BILATERAL Bilateral 1999    Allergies  Allergen Reactions  . Lopid [Gemfibrozil]   . Sulfa Antibiotics   . Zocor [Simvastatin]     Outpatient Encounter Medications as of 08/19/2018  Medication Sig  . acetaminophen (TYLENOL) 325 MG tablet Take 650 mg by mouth every 6 (six) hours as needed for mild pain.   . calcium carbonate (TUMS) 500 MG chewable tablet Chew 3 tablets by mouth daily.   . Cholecalciferol (VITAMIN D) 1000 UNITS capsule Take 2,000 Units by mouth daily.   .Marland Kitchendonepezil (ARICEPT) 10 MG tablet Take 10 mg by mouth at bedtime. To help preserve memory.  . polyethylene glycol (MIRALAX / GLYCOLAX) packet Take 17 g by mouth daily.   No facility-administered encounter medications on file as of 08/19/2018.    ROS was provided with assistance of staff.  Review of Systems  Constitutional: Positive for unexpected weight change. Negative for activity change, appetite change, chills, diaphoresis, fatigue and fever.       #12Ibs weight loss in the past month.   HENT: Positive for hearing loss. Negative for congestion and voice change.   Respiratory: Negative for cough, shortness of breath and wheezing.   Gastrointestinal: Negative for abdominal distention,  abdominal pain, constipation, diarrhea, nausea and vomiting.  Genitourinary: Negative for difficulty urinating, dysuria and urgency.  Musculoskeletal: Positive for arthralgias and gait problem.  Skin: Negative for color change and pallor.  Neurological: Negative for dizziness, speech difficulty, weakness and headaches.       Dementia  Psychiatric/Behavioral: Negative for agitation, behavioral problems, hallucinations and sleep disturbance. The patient is not nervous/anxious.     Immunization History  Administered Date(s) Administered  . Influenza Whole 01/16/2012, 02/20/2013, 01/17/2018  . Influenza-Unspecified 02/19/2014, 01/05/2015, 02/01/2016  . PPD Test 08/23/2010, 08/04/2015  . Pneumococcal Polysaccharide-23 06/24/1998  . Td 05/02/1974   Pertinent  Health Maintenance Due  Topic Date Due  . DEXA SCAN  03/19/1986  . PNA vac Low Risk Adult (2 of 2 - PCV13) 06/24/1999  . INFLUENZA VACCINE  11/16/2018   Fall Risk  12/15/2016 08/06/2015 08/06/2015 11/19/2014 11/19/2014  Falls in the past year? No No No Yes No  Number falls in past yr: - - - 1 -  Injury with Fall? - - - No -   Functional Status Survey:    Vitals:   08/19/18 1058  BP: 118/62  Pulse: 64  Resp: 18  Temp: 97.7 F (36.5 C)  SpO2: 93%  Weight: 148 lb 11.2 oz (67.4 kg)  Height: '5\' 3"'  (1.6 m)   Body mass index is 26.34 kg/m. Physical Exam Constitutional:      General: She is not in acute distress.    Appearance: Normal appearance. She is not toxic-appearing or diaphoretic.     Comments: Over weight  HENT:     Head: Normocephalic and atraumatic.     Nose: Nose normal.     Mouth/Throat:     Mouth: Mucous membranes are moist.  Eyes:     Extraocular Movements: Extraocular movements intact.     Conjunctiva/sclera: Conjunctivae normal.     Pupils: Pupils are equal, round, and reactive to light.  Neck:     Musculoskeletal: Normal range of motion and neck supple.  Cardiovascular:     Rate and Rhythm: Normal rate  and regular rhythm.     Heart sounds: No murmur.  Pulmonary:     Effort: Pulmonary effort is normal.     Breath sounds: No wheezing, rhonchi or rales.  Abdominal:     General: There is no distension.     Palpations: Abdomen is soft.     Tenderness: There is no abdominal tenderness. There is no right CVA tenderness, left CVA tenderness, guarding or rebound.  Musculoskeletal:     Right lower leg: No edema.     Left lower leg: No edema.     Comments: Needs assistance for transfer. W/c for mobility.   Skin:    General: Skin is warm and dry.  Neurological:     General: No focal deficit present.     Mental Status: She is alert. Mental status is at baseline.     Motor: No weakness.     Coordination: Coordination normal.     Gait: Gait abnormal.     Comments: Oriented to self.   Psychiatric:        Mood  and Affect: Mood normal.        Behavior: Behavior normal.     Labs reviewed: Recent Labs    09/20/17  NA 140  K 4.1  CL 103  CO2 31  BUN 24*  CREATININE 1.2*  CALCIUM 9.2   Recent Labs    09/20/17  AST 23  ALT 22  ALKPHOS 104  PROT 6.3  ALBUMIN 3.7   Recent Labs    09/20/17  WBC 7.3  HGB 14.6  HCT 43  PLT 246   Lab Results  Component Value Date   TSH 2.42 09/20/2017   Lab Results  Component Value Date   HGBA1C 5.7 03/10/2014   Lab Results  Component Value Date   CHOL 297 (A) 04/01/2013   HDL 54 04/01/2013   LDLCALC 200 04/01/2013   TRIG 214 (A) 04/01/2013    Significant Diagnostic Results in last 30 days:  No results found.  Assessment/Plan Advanced dementia Continue SNF [facility] for safety and care assistance, continue Donepezil 57m qd for memory.   Slow transit constipation Stable, continue MiraLax qd.   Abnormality of gait Risk for falling due to lack of safety awareness, unsteady gait. Will assist the patient with transfer, w/c for mobility.   Weight loss #12Ibs weight loss in the past month. Update CBC/diff, CMP/eGFR, TSH, f/u dietitian.        Family/ staff Communication: plan of care reviewed with the patient and charge nurse.   Labs/tests ordered: CBC/diff, CMP/eGFR, TSH  Time spend 25 minutes.

## 2018-08-19 NOTE — Assessment & Plan Note (Signed)
Stable, continue MiraLax qd.  

## 2018-08-19 NOTE — Assessment & Plan Note (Signed)
Continue SNF [facility] for safety and care assistance, continue Donepezil 10mg qd for memory.  

## 2018-08-19 NOTE — Assessment & Plan Note (Signed)
Risk for falling due to lack of safety awareness, unsteady gait. Will assist the patient with transfer, w/c for mobility.

## 2018-08-21 LAB — BASIC METABOLIC PANEL
BUN: 23 — AB (ref 4–21)
Creatinine: 1.6 — AB (ref 0.5–1.1)
Glucose: 83
Potassium: 3.8 (ref 3.4–5.3)
Sodium: 142 (ref 137–147)

## 2018-08-21 LAB — TSH: TSH: 1.82 (ref 0.41–5.90)

## 2018-08-21 LAB — CBC AND DIFFERENTIAL
HCT: 43 (ref 36–46)
Hemoglobin: 15.6 (ref 12.0–16.0)
Platelets: 190 (ref 150–399)

## 2018-08-21 LAB — HEPATIC FUNCTION PANEL
ALT: 20 (ref 7–35)
AST: 24 (ref 13–35)

## 2018-08-21 NOTE — Assessment & Plan Note (Addendum)
08/20/18 TSH 1.82, Na 142, K 3.8, Bun 23, creat 1.64, TP 5.8, albumin 3.3, wbc 5.2, Hgb 15.6, plt 190, neutrophils 56 08/21/18 encourage oral fluid intake, repeat BMP one week.

## 2018-08-27 LAB — BASIC METABOLIC PANEL
BUN: 13 (ref 4–21)
Creatinine: 1.4 — AB (ref 0.5–1.1)
Glucose: 82
Potassium: 4.3 (ref 3.4–5.3)
Sodium: 139 (ref 137–147)

## 2018-09-16 ENCOUNTER — Encounter: Payer: Self-pay | Admitting: Nurse Practitioner

## 2018-09-16 ENCOUNTER — Non-Acute Institutional Stay (SKILLED_NURSING_FACILITY): Payer: Medicare Other | Admitting: Nurse Practitioner

## 2018-09-16 DIAGNOSIS — K5901 Slow transit constipation: Secondary | ICD-10-CM | POA: Diagnosis not present

## 2018-09-16 DIAGNOSIS — F03C0 Unspecified dementia, severe, without behavioral disturbance, psychotic disturbance, mood disturbance, and anxiety: Secondary | ICD-10-CM

## 2018-09-16 DIAGNOSIS — R269 Unspecified abnormalities of gait and mobility: Secondary | ICD-10-CM | POA: Diagnosis not present

## 2018-09-16 DIAGNOSIS — F0390 Unspecified dementia without behavioral disturbance: Secondary | ICD-10-CM | POA: Diagnosis not present

## 2018-09-16 NOTE — Assessment & Plan Note (Signed)
Continue SNF [facility] for safety and care assistance, continue Donepezil 10mg qd for memory.  

## 2018-09-16 NOTE — Progress Notes (Signed)
Location:   [facility] Room Number: 30/A Place of Service:  SNF (31) Provider: [facility]ManXie Brihana Quickel NP  Mahlon GammonGupta, Anjali L, MD  Patient Care Team: Mahlon GammonGupta, Anjali L, MD as PCP - General (Internal Medicine) [place] Jemery Stacey X, NP as Nurse Practitioner (Internal Medicine)  Extended Emergency Contact Information Primary Emergency Contact: [facility] Address: [address]          [place], [place] [address] [place] Home Phone: [phone] Relation: Son Secondary Emergency Contact: [name],[name] Address: [address]          [place], [place] [address] [place] Home Phone: [phone] Relation: Daughter  Code Status:  DNR Goals of care: Advanced Directive information Advanced Directives 09/16/2018  Does Patient Have a Medical Advance Directive? Yes  Type of Estate agentAdvance Directive Healthcare Power of LanareAttorney;Out of facility DNR (pink MOST or yellow form)  Does patient want to make changes to medical advance directive? No - Patient declined  Copy of Healthcare Power of Attorney in Chart? -  Pre-existing out of facility DNR order (yellow form or pink MOST form) Yellow form placed in chart (order not valid for inpatient use)     Chief Complaint  Patient presents with  . Medical Management of Chronic Issues    Routine visit   . Health Maintenance    Dexa scan,PCV 13 Vaccine     HPI:  Pt is a 85 y.o. female seen today for medical management of chronic diseases.    [facility] care [facility], on Donepezil 10mg  qd for memory. NO constipation, on MiraLax qd. She uses w/c for mobility due to her gait abnormality.    Past Medical History:  Diagnosis Date  . Abnormal mammogram, unspecified 10/25/2006  . Abnormality of gait 03/02/2011  . Allergic rhinitis due to pollen 07/13/2006  . Blood in stool 06/29/2011  . Candidiasis 07/01/2012  . Cholelithiasis 08/02/2010  . Disturbance of skin sensation 11/25/2009  .  Diverticulitis of colon (without mention of hemorrhage)(562.11) 10/28/2003  . Dizziness and giddiness 07/13/2006  . Edema 07/19/2006  . Lumbago 12/22/2009  . Memory loss 02/17/2010  . Osteoarthrosis, unspecified whether generalized or localized, unspecified site 07/19/2006  . Other and unspecified hyperlipidemia 03/28/2007  . Pain in joint, upper arm 10/25/2006  . Personal history of fall 12/22/2009  . Unspecified constipation 09/26/2007  . Unspecified essential hypertension 07/19/2006  . Unspecified vitamin D deficiency 08/19/2009  . Uterine prolapse without mention of vaginal wall prolapse 06/29/2011   Past Surgical History:  Procedure Laterality Date  . CATARACT EXTRACTION W/ INTRAOCULAR LENS  IMPLANT, BILATERAL Bilateral 1999    Allergies  Allergen Reactions  . Lopid [Gemfibrozil]   . Sulfa Antibiotics   . Zocor [Simvastatin]     Allergies as of 09/16/2018      Reactions   Lopid [gemfibrozil]    Sulfa Antibiotics    Zocor [simvastatin]       Medication List       Accurate as of September 16, 2018  5:11 PM. If you have any questions, ask your nurse or doctor.        acetaminophen 325 MG tablet Commonly known as:  TYLENOL Take 650 mg by mouth every 6 (six) hours as needed for mild pain.   donepezil 10 MG tablet Commonly known as:  ARICEPT Take 10 mg by mouth at bedtime. To help preserve memory.   polyethylene glycol 17 g packet Commonly known  as:  MIRALAX / GLYCOLAX Take 17 g by mouth daily.   Tums 500 MG chewable tablet Generic drug:  calcium carbonate Chew 3 tablets by mouth daily.   Vitamin D 1000 units capsule Take 2,000 Units by mouth daily.      ROS was provided with assistance of staff Review of Systems  Constitutional: Negative for activity change, appetite change, chills, diaphoresis, fatigue, fever and unexpected weight change.  HENT: Positive for hearing loss. Negative for congestion, sinus pain, sore throat and voice change.   Respiratory: Negative for cough,  shortness of breath and wheezing.   Cardiovascular: Negative for chest pain, palpitations and leg swelling.  Gastrointestinal: Negative for abdominal distention, abdominal pain, constipation, diarrhea, nausea and vomiting.  Genitourinary: Negative for difficulty urinating, dysuria, hematuria and urgency.  Musculoskeletal: Positive for arthralgias and gait problem.  Neurological: Negative for dizziness, facial asymmetry, speech difficulty, weakness, numbness and headaches.       Dementia  Psychiatric/Behavioral: Negative for agitation, behavioral problems, hallucinations and sleep disturbance. The patient is not nervous/anxious.     Immunization History  Administered Date(s) Administered  . Influenza Whole 01/16/2012, 02/20/2013, 01/17/2018  . Influenza-Unspecified 02/19/2014, 01/05/2015, 02/01/2016  . PPD Test 08/23/2010, 08/04/2015  . Pneumococcal Conjugate-13 12/29/2016  . Pneumococcal Polysaccharide-23 06/24/1998  . Td 05/02/1974, 08/09/2017   Pertinent  Health Maintenance Due  Topic Date Due  . DEXA SCAN  03/19/1986  . PNA vac Low Risk Adult (2 of 2 - PCV13) 06/24/1999  . INFLUENZA VACCINE  11/16/2018   Fall Risk  12/15/2016 08/06/2015 08/06/2015 11/19/2014 11/19/2014  Falls in the past year? No No No Yes No  Number falls in past yr: - - - 1 -  Injury with Fall? - - - No -   Functional Status Survey:    Vitals:   09/16/18 0949  BP: 122/62  Pulse: 64  Resp: 16  Temp: 97.7 F (36.5 C)  SpO2: 91%  Weight: 147 lb 9.6 oz (67 kg)  Height:  (1.6 m)   Body mass index is 26.15 kg/m. Physical Exam Vitals signs and nursing note reviewed.  Constitutional:      General: She is not in acute distress.    Appearance: Normal appearance. She is not ill-appearing, toxic-appearing or diaphoretic.     Comments: Over weight  HENT:     Head: Normocephalic and atraumatic.     Nose: Nose normal.     Mouth/Throat:     Mouth: Mucous membranes are moist.  Eyes:     Extraocular  Movements: Extraocular movements intact.     Conjunctiva/sclera: Conjunctivae normal.     Pupils: Pupils are equal, round, and reactive to light.  Neck:     Musculoskeletal: Normal range of motion and neck supple.  Cardiovascular:     Rate and Rhythm: Normal rate and regular rhythm.     Heart sounds: No murmur.  Pulmonary:     Effort: Pulmonary effort is normal.     Breath sounds: No wheezing, rhonchi or rales.  Abdominal:     General: There is no distension.     Palpations: Abdomen is soft.     Tenderness: There is no abdominal tenderness. There is no right CVA tenderness, left CVA tenderness, guarding or rebound.  Musculoskeletal:     Right lower leg: No edema.     Left lower leg: No edema.     Comments: W/c for mobility.   Skin:    General: Skin is warm and dry.  Neurological:  General: No focal deficit present.     Mental Status: She is alert. Mental status is at baseline.     Cranial Nerves: No cranial nerve deficit.     Motor: No weakness.     Coordination: Coordination normal.     Gait: Gait abnormal.     Comments: Oriented to self.   Psychiatric:        Mood and Affect: Mood normal.        Behavior: Behavior normal.     Labs reviewed: Recent Labs    09/20/17 08/21/18 08/27/18  NA 140 142 139  K 4.1 3.8 4.3  CL 103  --   --   CO2 31  --   --   BUN 24* 23* 13  CREATININE 1.2* 1.6* 1.4*  CALCIUM 9.2  --   --    Recent Labs    09/20/17 08/21/18  AST 23 24  ALT 22 20  ALKPHOS 104  --   PROT 6.3  --   ALBUMIN 3.7  --    Recent Labs    09/20/17 08/21/18  WBC 7.3  --   HGB 14.6 15.6  HCT 43 43  PLT 246 190   Lab Results  Component Value Date   TSH 1.82 08/21/2018   Lab Results  Component Value Date   HGBA1C 5.7 03/10/2014   Lab Results  Component Value Date   CHOL 297 (A) 04/01/2013   HDL 54 04/01/2013   LDLCALC 200 04/01/2013   TRIG 214 (A) 04/01/2013    Significant Diagnostic Results in last 30 days:  No results found.   Assessment/Plan  Slow transit constipation Stable, continue MiraLax qd.   Abnormality of gait Continue assist the patient with transfer, w/c for mobility.   Advanced dementia Continue SNF [facility] for safety and care assistance, continue Donepezil 10mg  qd for memory.    Family/ staff Communication: plan of care reviewed with the patient and charge nurse.   Labs/tests ordered: none  Time spend 25 minutes.

## 2018-09-16 NOTE — Assessment & Plan Note (Signed)
Stable, continue MiraLax qd.  

## 2018-09-16 NOTE — Assessment & Plan Note (Signed)
Continue assist the patient with transfer, w/c for mobility.

## 2018-09-25 ENCOUNTER — Encounter: Payer: Self-pay | Admitting: Nurse Practitioner

## 2018-09-25 ENCOUNTER — Emergency Department (HOSPITAL_COMMUNITY): Payer: Medicare Other

## 2018-09-25 ENCOUNTER — Non-Acute Institutional Stay (SKILLED_NURSING_FACILITY): Payer: Medicare Other | Admitting: Nurse Practitioner

## 2018-09-25 ENCOUNTER — Other Ambulatory Visit: Payer: Self-pay

## 2018-09-25 ENCOUNTER — Inpatient Hospital Stay (HOSPITAL_COMMUNITY)
Admission: EM | Admit: 2018-09-25 | Discharge: 2018-10-16 | DRG: 951 | Disposition: E | Payer: Medicare Other | Source: Skilled Nursing Facility | Attending: Internal Medicine | Admitting: Internal Medicine

## 2018-09-25 DIAGNOSIS — E870 Hyperosmolality and hypernatremia: Secondary | ICD-10-CM | POA: Diagnosis present

## 2018-09-25 DIAGNOSIS — Z515 Encounter for palliative care: Principal | ICD-10-CM

## 2018-09-25 DIAGNOSIS — R402112 Coma scale, eyes open, never, at arrival to emergency department: Secondary | ICD-10-CM | POA: Diagnosis present

## 2018-09-25 DIAGNOSIS — R0682 Tachypnea, not elsewhere classified: Secondary | ICD-10-CM | POA: Insufficient documentation

## 2018-09-25 DIAGNOSIS — F0390 Unspecified dementia without behavioral disturbance: Secondary | ICD-10-CM | POA: Diagnosis present

## 2018-09-25 DIAGNOSIS — R401 Stupor: Secondary | ICD-10-CM

## 2018-09-25 DIAGNOSIS — R627 Adult failure to thrive: Secondary | ICD-10-CM | POA: Diagnosis present

## 2018-09-25 DIAGNOSIS — R402342 Coma scale, best motor response, flexion withdrawal, at arrival to emergency department: Secondary | ICD-10-CM | POA: Diagnosis present

## 2018-09-25 DIAGNOSIS — R000 Tachycardia, unspecified: Secondary | ICD-10-CM | POA: Diagnosis not present

## 2018-09-25 DIAGNOSIS — N179 Acute kidney failure, unspecified: Secondary | ICD-10-CM

## 2018-09-25 DIAGNOSIS — E860 Dehydration: Secondary | ICD-10-CM | POA: Diagnosis present

## 2018-09-25 DIAGNOSIS — E785 Hyperlipidemia, unspecified: Secondary | ICD-10-CM | POA: Diagnosis present

## 2018-09-25 DIAGNOSIS — K219 Gastro-esophageal reflux disease without esophagitis: Secondary | ICD-10-CM | POA: Diagnosis present

## 2018-09-25 DIAGNOSIS — F03C0 Unspecified dementia, severe, without behavioral disturbance, psychotic disturbance, mood disturbance, and anxiety: Secondary | ICD-10-CM

## 2018-09-25 DIAGNOSIS — R4182 Altered mental status, unspecified: Secondary | ICD-10-CM

## 2018-09-25 DIAGNOSIS — R402212 Coma scale, best verbal response, none, at arrival to emergency department: Secondary | ICD-10-CM | POA: Diagnosis present

## 2018-09-25 DIAGNOSIS — K5901 Slow transit constipation: Secondary | ICD-10-CM

## 2018-09-25 DIAGNOSIS — Z20828 Contact with and (suspected) exposure to other viral communicable diseases: Secondary | ICD-10-CM | POA: Diagnosis present

## 2018-09-25 DIAGNOSIS — N189 Chronic kidney disease, unspecified: Secondary | ICD-10-CM | POA: Diagnosis present

## 2018-09-25 DIAGNOSIS — Z882 Allergy status to sulfonamides status: Secondary | ICD-10-CM

## 2018-09-25 DIAGNOSIS — I129 Hypertensive chronic kidney disease with stage 1 through stage 4 chronic kidney disease, or unspecified chronic kidney disease: Secondary | ICD-10-CM | POA: Diagnosis present

## 2018-09-25 DIAGNOSIS — Z888 Allergy status to other drugs, medicaments and biological substances status: Secondary | ICD-10-CM

## 2018-09-25 DIAGNOSIS — Z8249 Family history of ischemic heart disease and other diseases of the circulatory system: Secondary | ICD-10-CM

## 2018-09-25 DIAGNOSIS — Z66 Do not resuscitate: Secondary | ICD-10-CM | POA: Diagnosis present

## 2018-09-25 DIAGNOSIS — M1990 Unspecified osteoarthritis, unspecified site: Secondary | ICD-10-CM | POA: Diagnosis present

## 2018-09-25 LAB — COMPREHENSIVE METABOLIC PANEL
ALT: 69 U/L — ABNORMAL HIGH (ref 0–44)
AST: 55 U/L — ABNORMAL HIGH (ref 15–41)
Albumin: 3.6 g/dL (ref 3.5–5.0)
Alkaline Phosphatase: 123 U/L (ref 38–126)
Anion gap: 15 (ref 5–15)
BUN: 97 mg/dL — ABNORMAL HIGH (ref 8–23)
CO2: 21 mmol/L — ABNORMAL LOW (ref 22–32)
Calcium: 9.5 mg/dL (ref 8.9–10.3)
Chloride: 124 mmol/L — ABNORMAL HIGH (ref 98–111)
Creatinine, Ser: 4.67 mg/dL — ABNORMAL HIGH (ref 0.44–1.00)
GFR calc Af Amer: 8 mL/min — ABNORMAL LOW (ref >60)
GFR calc non Af Amer: 7 mL/min — ABNORMAL LOW (ref >60)
Glucose, Bld: 142 mg/dL — ABNORMAL HIGH (ref 70–99)
Potassium: 4 mmol/L (ref 3.5–5.1)
Sodium: 160 mmol/L — ABNORMAL HIGH (ref 135–145)
Total Bilirubin: 1.9 mg/dL — ABNORMAL HIGH (ref 0.3–1.2)
Total Protein: 6.7 g/dL (ref 6.5–8.1)

## 2018-09-25 LAB — CBC WITH DIFFERENTIAL/PLATELET
Abs Immature Granulocytes: 0.17 10*3/uL — ABNORMAL HIGH (ref 0.00–0.07)
Basophils Absolute: 0 10*3/uL (ref 0.0–0.1)
Basophils Relative: 0 %
Eosinophils Absolute: 0 10*3/uL (ref 0.0–0.5)
Eosinophils Relative: 0 %
HCT: 60.9 % — ABNORMAL HIGH (ref 36.0–46.0)
Hemoglobin: 18.7 g/dL — ABNORMAL HIGH (ref 12.0–15.0)
Immature Granulocytes: 1 %
Lymphocytes Relative: 5 %
Lymphs Abs: 0.8 10*3/uL (ref 0.7–4.0)
MCH: 31.6 pg (ref 26.0–34.0)
MCHC: 30.7 g/dL (ref 30.0–36.0)
MCV: 102.9 fL — ABNORMAL HIGH (ref 80.0–100.0)
Monocytes Absolute: 1.1 10*3/uL — ABNORMAL HIGH (ref 0.1–1.0)
Monocytes Relative: 7 %
Neutro Abs: 14.1 10*3/uL — ABNORMAL HIGH (ref 1.7–7.7)
Neutrophils Relative %: 87 %
Platelets: 94 10*3/uL — ABNORMAL LOW (ref 150–400)
RBC: 5.92 MIL/uL — ABNORMAL HIGH (ref 3.87–5.11)
RDW: 14.6 % (ref 11.5–15.5)
WBC: 16.1 10*3/uL — ABNORMAL HIGH (ref 4.0–10.5)
nRBC: 0.6 % — ABNORMAL HIGH (ref 0.0–0.2)

## 2018-09-25 LAB — SARS CORONAVIRUS 2: SARS Coronavirus 2: NOT DETECTED

## 2018-09-25 LAB — APTT: aPTT: 25 seconds (ref 24–36)

## 2018-09-25 LAB — POCT I-STAT 7, (LYTES, BLD GAS, ICA,H+H)
Acid-base deficit: 2 mmol/L (ref 0.0–2.0)
Bicarbonate: 21.8 mmol/L (ref 20.0–28.0)
Calcium, Ion: 1.3 mmol/L (ref 1.15–1.40)
HCT: 53 % — ABNORMAL HIGH (ref 36.0–46.0)
Hemoglobin: 18 g/dL — ABNORMAL HIGH (ref 12.0–15.0)
O2 Saturation: 96 %
Patient temperature: 99.5
Potassium: 3.8 mmol/L (ref 3.5–5.1)
Sodium: 162 mmol/L (ref 135–145)
TCO2: 23 mmol/L (ref 22–32)
pCO2 arterial: 34.2 mmHg (ref 32.0–48.0)
pH, Arterial: 7.414 (ref 7.350–7.450)
pO2, Arterial: 81 mmHg — ABNORMAL LOW (ref 83.0–108.0)

## 2018-09-25 LAB — AMMONIA: Ammonia: 27 umol/L (ref 9–35)

## 2018-09-25 LAB — CBG MONITORING, ED: Glucose-Capillary: 141 mg/dL — ABNORMAL HIGH (ref 70–99)

## 2018-09-25 LAB — PROTIME-INR
INR: 1.3 — ABNORMAL HIGH (ref 0.8–1.2)
Prothrombin Time: 15.7 seconds — ABNORMAL HIGH (ref 11.4–15.2)

## 2018-09-25 MED ORDER — DIPHENHYDRAMINE HCL 50 MG/ML IJ SOLN: 12.5000 mg | INTRAMUSCULAR | Status: DC | PRN

## 2018-09-25 MED ORDER — SODIUM CHLORIDE 0.9 % IV SOLN
12.5000 mg | Freq: Four times a day (QID) | INTRAVENOUS | Status: DC | PRN
  Filled 2018-09-25: qty 0.5

## 2018-09-25 MED ORDER — BIOTENE DRY MOUTH MT LIQD: 15.0000 mL | OROMUCOSAL | Status: DC | PRN

## 2018-09-25 MED ORDER — MAGIC MOUTHWASH W/LIDOCAINE
15.0000 mL | Freq: Four times a day (QID) | ORAL | Status: DC | PRN
  Filled 2018-09-25: qty 15

## 2018-09-25 MED ORDER — MORPHINE SULFATE (CONCENTRATE) 10 MG/0.5ML PO SOLN: 5.0000 mg | ORAL | Status: DC | PRN

## 2018-09-25 MED ORDER — ONDANSETRON HCL 4 MG/2ML IJ SOLN: 4.0000 mg | Freq: Four times a day (QID) | INTRAMUSCULAR | Status: DC | PRN

## 2018-09-25 MED ORDER — GLYCOPYRROLATE 1 MG PO TABS
1.0000 mg | ORAL_TABLET | ORAL | Status: DC | PRN
  Filled 2018-09-25: qty 1

## 2018-09-25 MED ORDER — POLYVINYL ALCOHOL 1.4 % OP SOLN
1.0000 [drp] | Freq: Four times a day (QID) | OPHTHALMIC | Status: DC | PRN
  Filled 2018-09-25: qty 15

## 2018-09-25 MED ORDER — LORAZEPAM 1 MG PO TABS: 1.0000 mg | ORAL_TABLET | ORAL | Status: DC | PRN

## 2018-09-25 MED ORDER — MORPHINE SULFATE (PF) 2 MG/ML IV SOLN
1.0000 mg | INTRAVENOUS | Status: DC | PRN
  Administered 2018-09-25 – 2018-09-26 (×2): 1 mg via INTRAVENOUS
  Filled 2018-09-25 (×2): qty 1

## 2018-09-25 MED ORDER — GLYCOPYRROLATE 0.2 MG/ML IJ SOLN: 0.2000 mg | INTRAMUSCULAR | Status: DC | PRN

## 2018-09-25 MED ORDER — SENNA 8.6 MG PO TABS: 1.0000 | ORAL_TABLET | Freq: Every evening | ORAL | Status: DC | PRN

## 2018-09-25 MED ORDER — HALOPERIDOL LACTATE 2 MG/ML PO CONC
0.5000 mg | ORAL | Status: DC | PRN
  Filled 2018-09-25: qty 0.3

## 2018-09-25 MED ORDER — LACTATED RINGERS IV SOLN: INTRAVENOUS | Status: DC

## 2018-09-25 MED ORDER — LORAZEPAM 2 MG/ML IJ SOLN: 1.0000 mg | INTRAMUSCULAR | Status: DC | PRN

## 2018-09-25 MED ORDER — TEMAZEPAM 7.5 MG PO CAPS: 7.5000 mg | ORAL_CAPSULE | Freq: Every evening | ORAL | Status: DC | PRN

## 2018-09-25 MED ORDER — ACETAMINOPHEN 650 MG RE SUPP: 650.0000 mg | Freq: Four times a day (QID) | RECTAL | Status: DC | PRN

## 2018-09-25 MED ORDER — LORAZEPAM 2 MG/ML PO CONC: 1.0000 mg | ORAL | Status: DC | PRN

## 2018-09-25 MED ORDER — GLYCOPYRROLATE 0.2 MG/ML IJ SOLN
0.2000 mg | INTRAMUSCULAR | Status: DC | PRN
  Administered 2018-09-26: 0.2 mg via INTRAVENOUS
  Filled 2018-09-25: qty 1

## 2018-09-25 MED ORDER — ACETAMINOPHEN 325 MG PO TABS: 650.0000 mg | ORAL_TABLET | Freq: Four times a day (QID) | ORAL | Status: DC | PRN

## 2018-09-25 MED ORDER — HALOPERIDOL 0.5 MG PO TABS
0.5000 mg | ORAL_TABLET | ORAL | Status: DC | PRN
  Filled 2018-09-25: qty 1

## 2018-09-25 MED ORDER — NYSTATIN 100000 UNIT/GM EX POWD
Freq: Three times a day (TID) | CUTANEOUS | Status: DC | PRN
  Filled 2018-09-25: qty 15

## 2018-09-25 MED ORDER — ONDANSETRON 4 MG PO TBDP: 4.0000 mg | ORAL_TABLET | Freq: Four times a day (QID) | ORAL | Status: DC | PRN

## 2018-09-25 MED ORDER — HALOPERIDOL LACTATE 5 MG/ML IJ SOLN: 0.5000 mg | INTRAMUSCULAR | Status: DC | PRN

## 2018-09-25 NOTE — ED Notes (Addendum)
Per MD patient does not need in and out cath. Pt will be comfort measures only.

## 2018-09-25 NOTE — Assessment & Plan Note (Signed)
HR 110bpm, regular.

## 2018-09-25 NOTE — ED Provider Notes (Signed)
[place] EMERGENCY DEPARTMENT Provider Note   CSN: [id number] Arrival date & time: September 30, 2018  1524    History   Chief Complaint Chief Complaint  Patient presents with   Altered Mental Status    HPI DASHANA GUIZAR is a 85 y.o. female.     HPI  85 year old female without significant systemic comorbidity burden comes in a chief complaint of altered mental status.  [facility]  I spoke with the nursing staff over there, and they stated that patient typically resides at a memory unit and they did not know much about her.  Typically patient will take her medication when provided but is mostly nonverbal.  Today she was noted to be unresponsive to any verbal stimulants, and therefore eventually 911 was called.  They deny any known falls, fevers and there is no COVID-19 cases at the unit.  According to patient's son, Mr. Aniella Wandrey, patient is typically minimally responsive, however she would respond to verbal stimuli.  He is at bedside and states that what he is seeing is not normal.  He does indicate that patient has not been eating or drinking much and has had a slow but steady decline over the past 1 to 2 years.  CODE STATUS is DNR, DNI.   Past Medical History:  Diagnosis Date   Abnormal mammogram, unspecified 10/25/2006   Abnormality of gait 03/02/2011   Allergic rhinitis due to pollen 07/13/2006   Blood in stool 06/29/2011   Candidiasis 07/01/2012   Cholelithiasis 08/02/2010   Disturbance of skin sensation 11/25/2009   Diverticulitis of colon (without mention of hemorrhage)(562.11) 10/28/2003   Dizziness and giddiness 07/13/2006   Edema 07/19/2006   Lumbago 12/22/2009   Memory loss 02/17/2010   Osteoarthrosis, unspecified whether generalized or localized, unspecified site 07/19/2006   Other and unspecified hyperlipidemia 03/28/2007   Pain in joint, upper arm 10/25/2006   Personal history of fall 12/22/2009   Unspecified  constipation 09/26/2007   Unspecified essential hypertension 07/19/2006   Unspecified vitamin D deficiency 08/19/2009   Uterine prolapse without mention of vaginal wall prolapse 06/29/2011    Patient Active Problem List   Diagnosis Date Noted   Tachycardia September 30, 2018   Tachypnea 2018-09-30   Weight loss 08/19/2018   GERD (gastroesophageal reflux disease) 04/24/2018   Onychomycosis of toenail 10/16/2017   Hemorrhoids 08/31/2017   Venous insufficiency of both lower extremities 05/09/2017   HOH (hard of hearing) 03/16/2017   Non-seasonal allergic rhinitis 12/21/2016   Slow transit constipation 12/21/2016   Altered mental state 09/22/2016   Acute kidney injury superimposed on CKD ([place]) 11/12/2015   Osteoarthritis (arthritis due to wear and tear of joints) 05/17/2015   Advanced dementia ([place]) 10/26/2012   Uterine prolapse without mention of vaginal wall prolapse 06/29/2011   Abnormality of gait 03/02/2011   Dyslipidemia 03/28/2007   Essential hypertension 07/19/2006    Past Surgical History:  Procedure Laterality Date   CATARACT EXTRACTION W/ INTRAOCULAR LENS  IMPLANT, BILATERAL Bilateral 1999     OB History   No obstetric history on file.      Home Medications    Prior to Admission medications   Medication Sig Start Date End Date Taking? Authorizing Provider  calcium carbonate (TUMS) 500 MG chewable tablet Chew 3 tablets by mouth daily.    Yes [provider]  Cholecalciferol (VITAMIN D) 1000 UNITS capsule Take 2,000 Units by mouth daily.    Yes [provider]  donepezil (ARICEPT) 10 MG tablet  Take 10 mg by mouth at bedtime. To help preserve memory. 10/26/12  Yes Kimber RelicGreen, Arthur G, MD    Family History Family History  Problem Relation Age of Onset   Heart disease Father        MI    Social History Social History   Tobacco Use   Smoking status: Never Smoker   Smokeless tobacco: Never Used  Substance Use Topics   Alcohol use:  No   Drug use: No     Allergies   Lopid [gemfibrozil]; Sulfa antibiotics; and Zocor [simvastatin]   Review of Systems Review of Systems  Unable to perform ROS: Mental status change     Physical Exam Updated Vital Signs BP 116/72    Pulse (!) 107    Temp 99.5 F (37.5 C) (Rectal)    Resp (!) 31    SpO2 100%   Physical Exam Vitals signs and nursing note reviewed.  Constitutional:      Appearance: She is well-developed.     Comments: Unresponsive  HENT:     Head: Normocephalic and atraumatic.  Eyes:     Comments: 3 mm and equal  Neck:     Musculoskeletal: No neck rigidity.  Cardiovascular:     Rate and Rhythm: Normal rate.  Pulmonary:     Effort: Pulmonary effort is normal.  Abdominal:     Tenderness: There is abdominal tenderness.     Comments: Tenderness to palpation over the abdomen diffusely  Musculoskeletal:     Right lower leg: No edema.     Left lower leg: No edema.  Skin:    General: Skin is warm.  Neurological:     Comments: Positive weak gag reflex.  Patient response to noxious stimuli, but does not follow commands or answer any questions.      ED Treatments / Results  Labs (all labs ordered are listed, but only abnormal results are displayed) Labs Reviewed  COMPREHENSIVE METABOLIC PANEL - Abnormal; Notable for the following components:      Result Value   Sodium 160 (*)    Chloride 124 (*)    CO2 21 (*)    Glucose, Bld 142 (*)    BUN 97 (*)    Creatinine, Ser 4.67 (*)    AST 55 (*)    ALT 69 (*)    Total Bilirubin 1.9 (*)    GFR calc non Af Amer 7 (*)    GFR calc Af Amer 8 (*)    All other components within normal limits  CBC WITH DIFFERENTIAL/PLATELET - Abnormal; Notable for the following components:   WBC 16.1 (*)    RBC 5.92 (*)    Hemoglobin 18.7 (*)    HCT 60.9 (*)    MCV 102.9 (*)    Platelets 94 (*)    nRBC 0.6 (*)    Neutro Abs 14.1 (*)    Monocytes Absolute 1.1 (*)    Abs Immature Granulocytes 0.17 (*)    All other  components within normal limits  PROTIME-INR - Abnormal; Notable for the following components:   Prothrombin Time 15.7 (*)    INR 1.3 (*)    All other components within normal limits  CBG MONITORING, ED - Abnormal; Notable for the following components:   Glucose-Capillary 141 (*)    All other components within normal limits  POCT I-STAT 7, (LYTES, BLD GAS, ICA,H+H) - Abnormal; Notable for the following components:   pO2, Arterial 81.0 (*)    Sodium 162 (*)  HCT 53.0 (*)    Hemoglobin 18.0 (*)    All other components within normal limits  SARS CORONAVIRUS 2  URINE CULTURE  CULTURE, BLOOD (ROUTINE X 2)  CULTURE, BLOOD (ROUTINE X 2)  AMMONIA  APTT  URINALYSIS, COMPLETE (UACMP) WITH MICROSCOPIC  I-STAT ARTERIAL BLOOD GAS, ED    EKG EKG Interpretation  Date/Time:  Wednesday September 25 2018 15:36:16 EDT Ventricular Rate:  108 PR Interval:    QRS Duration: 56 QT Interval:  388 QTC Calculation: 521 R Axis:   -10 Text Interpretation:  Sinus tachycardia Low voltage, precordial leads Abnormal R-wave progression, early transition Nonspecific repol abnormality, diffuse leads Prolonged QT interval No acute changes No old tracing to compare Confirmed by Derwood KaplanNanavati, Tijuan Dantes [phone]([id number]) on 09/21/2018 5:46:53 PM   Radiology Ct Head Wo Contrast  Result Date: 10/14/2018 CLINICAL DATA:  Altered level of consciousness, history hypertension EXAM: CT HEAD WITHOUT CONTRAST TECHNIQUE: Contiguous axial images were obtained from the base of the skull through the vertex without intravenous contrast. Sagittal and coronal MPR images reconstructed from axial data set. COMPARISON:  None FINDINGS: Brain: Generalized atrophy. Normal ventricular morphology. No midline shift or mass effect. Small vessel chronic ischemic changes of deep cerebral white matter. Probable small old infarct at inferior LEFT cerebellum. No intracranial hemorrhage, mass lesion, evidence of acute infarction, or extra-axial fluid collection.  Vascular: Atherosclerotic calcification of internal carotid and vertebral arteries at skull base Skull: Demineralized but intact Sinuses/Orbits: Clear Other: N/A IMPRESSION: Atrophy with small vessel chronic ischemic changes of deep cerebral white matter. Probable small old infarct at inferior LEFT cerebellum. No acute intracranial abnormalities. Electronically Signed   By: Ulyses SouthwardMark  Boles M.D.   On: 10/02/2018 17:21    Procedures .Critical Care Performed by: Derwood KaplanNanavati, Beckey Polkowski, MD Authorized by: Derwood KaplanNanavati, Nusayba Cadenas, MD   Critical care provider statement:    Critical care time (minutes):  120   Critical care was necessary to treat or prevent imminent or life-threatening deterioration of the following conditions:  Circulatory failure, metabolic crisis and renal failure   Critical care was time spent personally by me on the following activities:  Discussions with consultants, evaluation of patient's response to treatment, examination of patient, ordering and performing treatments and interventions, ordering and review of laboratory studies, ordering and review of radiographic studies, pulse oximetry, re-evaluation of patient's condition, obtaining history from patient or surrogate and review of old charts   (including critical care time)  Medications Ordered in ED Medications - No data to display   Initial Impression / Assessment and Plan / ED Course  I have reviewed the triage vital signs and the nursing notes.  Pertinent labs & imaging results that were available during my care of the patient were reviewed by me and considered in my medical decision making (see chart for details).  Clinical Course as of Sep 24 1936  Wed Sep 25, 2018  1926 Patient sodium is at 160.  With that she also has acute renal failure with creatinine of 4.67 and BUN of 97.  EKG has mild QT prolongation.  Results of the ED finding discussed with patient's son.  Patient's son informs me that patient really does not have any high  quality of life and he would like to discuss further goals of care with his family.  After deliberation with his family he informed me that family would opt for comfort care measures only, especially given that even if her current underlying problem is fixed, patient might return to the ER with same  predicament again.  Patient had informed them that she would not want any life prolonging measures if they were not going to lead to meaningful change in the quality of her life.  Patient son is quite okay if we also hold off fluids for now.   Sodium(!): 160 [AN]  1928 I discussed the case with Dr. Linna DarnerAnwar, palliative medicine.  She would want patient to be admitted to medicine service.   [AN]    Clinical Course User Index [AN] Derwood KaplanNanavati, Laynie Espy, MD       85 year old female comes in a chief complaint of altered mental status.  She has history of advanced dementia.  Patient is noted to have GCS of 6 (1/1/4).  She has a weak gag reflex.  She appears dry. On exam her abdomen is tender, but there is no rigidity appreciated.  The tenderness appears to be diffuse in nature.  She is also tachycardic and tachypneic.   DDx includes: ICH / Stroke ACS Sepsis syndrome Infection - UTI/Pneumonia Encephalopathy  Electrolyte abnormality Drug overdose DKA Metabolic disorders including thyroid disorders, adrenal insufficiency Cancer of unknown origin / paraneoplastic process Hypercapnia / COPD Hypoxia   Plan is to get basic labs and a CT head.  Final Clinical Impressions(s) / ED Diagnoses   Final diagnoses:  Acute hypernatremia  Stupor  AKI (acute kidney injury) [facility](HCC)    ED Discharge Orders    None       Derwood KaplanNanavati, Jakayden Cancio, MD 2018/06/30 1939

## 2018-09-25 NOTE — Progress Notes (Signed)
Received patient from Ed accompanied by son and daughter.  Pt. unresponsive but response to pain and for comfort care.  Administered PRN medication morphine 1mg  Inj for dypsnea, resp=30.  Pt's son and daughter would like to be called by Palliative Care to discuss Micro further.  Patient now comfortably resting on bed and family members then left [facility].

## 2018-09-25 NOTE — Progress Notes (Signed)
Location:   [facility] Room Number: 30 Place of Service:  SNF (31) Provider: [facility]ares[facility] M[facility] NP  Mahlon GammonGupta, Anjali L, MD  Patient Care Team: Mahlon GammonGupta, Anjali L, MD as PCP - General (Internal Medicine) [place] Zaela Graley X, NP as Nurse Practitioner (Internal Medicine)  Extended Emergency Contact Information Primary Emergency Contact: [facility] Address: [address]          [place], [place] [address] [place] Home Phone: [phone] Relation: Son Secondary Emergency Contact: [name],[name] Address: [address]          [place], [place] [address] [place] Home Phone: [phone] Relation: Daughter  Code Status: DNR Goals of care: Advanced Directive information Advanced Directives 09/16/2018  Does Patient Have a Medical Advance Directive? Yes  Type of Estate agentAdvance Directive Healthcare Power of [place];Out of facility DNR (pink MOST or yellow form)  Does patient want to make changes to medical advance directive? No - Patient declined  Copy of Healthcare Power of Attorney in Chart? -  Pre-existing out of facility DNR order (yellow form or pink MOST form) Yellow form placed in chart (order not valid for inpatient use)     Chief Complaint  Patient presents with  . Acute Visit    altered mental status, fast heart beats and breathing     HPI:  Pt is a 85 y.o. female seen today for an acute visit for the patient was found clammy, HR 110, respiration 36. She only responded to pain stimuli. Sudden onset, duration is about an hour or two. She is afebrile. [facility] for advanced stage of dementia, on Donepezil 10mg  qd for memory. She takes MiraLax qd, last BMP 09/24/18. HPI was provided with assistance of staff.    Past Medical History:  Diagnosis Date  . Abnormal mammogram, unspecified 10/25/2006  . Abnormality of gait 03/02/2011  . Allergic rhinitis due to pollen 07/13/2006  . Blood in stool  06/29/2011  . Candidiasis 07/01/2012  . Cholelithiasis 08/02/2010  . Disturbance of skin sensation 11/25/2009  . Diverticulitis of colon (without mention of hemorrhage)(562.11) 10/28/2003  . Dizziness and giddiness 07/13/2006  . Edema 07/19/2006  . Lumbago 12/22/2009  . Memory loss 02/17/2010  . Osteoarthrosis, unspecified whether generalized or localized, unspecified site 07/19/2006  . Other and unspecified hyperlipidemia 03/28/2007  . Pain in joint, upper arm 10/25/2006  . Personal history of fall 12/22/2009  . Unspecified constipation 09/26/2007  . Unspecified essential hypertension 07/19/2006  . Unspecified vitamin D deficiency 08/19/2009  . Uterine prolapse without mention of vaginal wall prolapse 06/29/2011   Past Surgical History:  Procedure Laterality Date  . CATARACT EXTRACTION W/ INTRAOCULAR LENS  IMPLANT, BILATERAL Bilateral 1999    Allergies  Allergen Reactions  . Lopid [Gemfibrozil]   . Sulfa Antibiotics   . Zocor [Simvastatin]     Allergies as of December 21, 2018      Reactions   Lopid [gemfibrozil]    Sulfa Antibiotics    Zocor [simvastatin]       Medication List       Accurate as of September 25, 2018  2:19 PM. If you have any questions, ask your nurse or doctor.        acetaminophen 325 MG tablet Commonly known as:  TYLENOL Take 650 mg by mouth every 6 (six) hours as needed for mild pain.   donepezil 10 MG tablet Commonly known as:  ARICEPT Take 10 mg by  mouth at bedtime. To help preserve memory.   polyethylene glycol 17 g packet Commonly known as:  MIRALAX / GLYCOLAX Take 17 g by mouth daily.   Tums 500 MG chewable tablet Generic drug:  calcium carbonate Chew 3 tablets by mouth daily.   Vitamin D 1000 units capsule Take 2,000 Units by mouth daily.       Review of Systems unable to obtain ROS due to her advanced dementia. Additional information was provided with assistance of staff.   Immunization History  Administered Date(s) Administered  . Influenza Whole  01/16/2012, 02/20/2013, 01/17/2018  . Influenza-Unspecified 02/19/2014, 01/05/2015, 02/01/2016  . PPD Test 08/23/2010, 08/04/2015  . Pneumococcal Conjugate-13 12/29/2016  . Pneumococcal Polysaccharide-23 06/24/1998  . Td 05/02/1974, 08/09/2017   Pertinent  Health Maintenance Due  Topic Date Due  . DEXA SCAN  03/19/1986  . INFLUENZA VACCINE  11/16/2018  . PNA vac Low Risk Adult  Completed   Fall Risk  12/15/2016 08/06/2015 08/06/2015 11/19/2014 11/19/2014  Falls in the past year? No No No Yes No  Number falls in past yr: - - - 1 -  Injury with Fall? - - - No -   Functional Status Survey:    Vitals:   10/06/2018 1402  BP: 138/70  Pulse: (!) 110  Resp: (!) 36  Temp: (!) 97.4 F (36.3 C)  SpO2: 90%   There is no height or weight on file to calculate BMI. Physical Exam Vitals signs and nursing note reviewed.  Constitutional:      Comments: clammy  HENT:     Head: Normocephalic and atraumatic.     Nose: Nose normal.     Mouth/Throat:     Mouth: Mucous membranes are dry.  Eyes:     Extraocular Movements: Extraocular movements intact.     Conjunctiva/sclera: Conjunctivae normal.     Pupils: Pupils are equal, round, and reactive to light.  Neck:     Musculoskeletal: Normal range of motion and neck supple. No neck rigidity.  Cardiovascular:     Rate and Rhythm: Tachycardia present.     Heart sounds: No murmur.     Comments: HR 110bpm Pulmonary:     Breath sounds: No wheezing, rhonchi or rales.     Comments: RR 36 Abdominal:     General: Bowel sounds are normal. There is no distension.     Palpations: Abdomen is soft.     Tenderness: There is no abdominal tenderness. There is no right CVA tenderness, left CVA tenderness, guarding or rebound.  Musculoskeletal:     Right lower leg: No edema.     Left lower leg: No edema.  Skin:    Comments: clammy  Neurological:     General: No focal deficit present.     Comments: Only responded to pain stimuli with grimaces.   Psychiatric:      Comments: [place] 7/15     Labs reviewed: Recent Labs    08/21/18 08/27/18  NA 142 139  K 3.8 4.3  BUN 23* 13  CREATININE 1.6* 1.4*   Recent Labs    08/21/18  AST 24  ALT 20   Recent Labs    08/21/18  HGB 15.6  HCT 43  PLT 190   Lab Results  Component Value Date   TSH 1.82 08/21/2018   Lab Results  Component Value Date   HGBA1C 5.7 03/10/2014   Lab Results  Component Value Date   CHOL 297 (A) 04/01/2013   HDL 54 04/01/2013  [place] 200 04/01/2013   TRIG 214 (A) 04/01/2013    Significant Diagnostic Results in last 30 days:  No results found.  Assessment/Plan: Altered mental state HPOA agreed to ED eval and treat.   Tachycardia HR 110bpm, regular.   Tachypnea 36x per minutes, no O2 desaturation  Advanced dementia Resides in SN[facility] for care, on Donepezil for memory.   Slow transit constipation Stable, last BMP 09/24/18    Family/ staff Communication: plan of care reviewed with the patient, the patient's HPOA, and charge nurse.   Labs/tests ordered: none  Time spend 25 minutes.

## 2018-09-25 NOTE — Assessment & Plan Note (Signed)
36x per minutes, no O2 desaturation

## 2018-09-25 NOTE — ED Notes (Signed)
Per MD one set of blood cultures is okay.

## 2018-09-25 NOTE — H&P (Addendum)
History and Physical    [name] QKM:[id number] DOB: Jan 22, 1919 DOA: 10/04/2018  PCP: Virgie Dad, MD  [facility]  I have personally briefly reviewed patient's old medical records in [place]  Chief Complaint: Failure to thrive  HPI: [name] is a 85 y.o. female with medical history significant for advanced dementia and osteoarthritis who [facility] for progressive functional decline and unresponsiveness.  Entirety of history is obtained from patient's daughter at bedside as she is currently nonverbal and not interactive.  Patient reportedly is typically minimally responsive however over the last week or so has become significantly less responsive during family visits and not eating or drinking much.  She has had a slow but steady decline ongoing over the last 1-2 years.  Family states that patient is DNR/DNI and at this time as it appears she is approaching end-of-life, want to focus only on comfort during her remaining time.  ED Course:  Initial vitals showed BP 130/70, pulse 110, RR 36, temp 97.4 Fahrenheit, SPO2 90% on room air.  Labs were notable for sodium 160, potassium 4.0, chloride 124, bicarb 21, BUN 97, creatinine 4.67, serum glucose 142, AST 55, ALT 69, alk phos 123, T bili 1.9, WBC 16.1, hemoglobin 18.7, platelets 94,000, INR 1.3, ammonia 27.  ABG showed pH 7.414, PCO2 34.2, PO2 81.  SARS-CoV-2 test was negative.  CT head without contrast showed atrophy with small vessel chronic ischemic changes of deep cerebral white matter, possible small infarct at inferior left cerebellum, without acute intracranial abnormalities.  The ED physician discussed the case with patient's son who requested comfort measures only and no life prolonging measures if they are not going to lead to meaningful change in the quality of her life including holding off on IV fluids for now.   The ED physician, Dr. Kathrynn Humble, discussed the case with Dr. Rowe Pavy with palliative medicine who recommended medical admission and that palliative care will see the patient in the morning.  The hospitalist service was consulted to admit for further management.  Review of Systems:  Full review of systems unable to be obtained from patient as she is unresponsive.   Past Medical History:  Diagnosis Date  . Abnormal mammogram, unspecified 10/25/2006  . Abnormality of gait 03/02/2011  . Allergic rhinitis due to pollen 07/13/2006  . Blood in stool 06/29/2011  . Candidiasis 07/01/2012  . Cholelithiasis 08/02/2010  . Disturbance of skin sensation 11/25/2009  . Diverticulitis of colon (without mention of hemorrhage)(562.11) 10/28/2003  . Dizziness and giddiness 07/13/2006  . Edema 07/19/2006  . Lumbago 12/22/2009  . Memory loss 02/17/2010  . Osteoarthrosis, unspecified whether generalized or localized, unspecified site 07/19/2006  . Other and unspecified hyperlipidemia 03/28/2007  . Pain in joint, upper arm 10/25/2006  . Personal history of fall 12/22/2009  . Unspecified constipation 09/26/2007  . Unspecified essential hypertension 07/19/2006  . Unspecified vitamin D deficiency 08/19/2009  . Uterine prolapse without mention of vaginal wall prolapse 06/29/2011    Past Surgical History:  Procedure Laterality Date  . CATARACT EXTRACTION W/ INTRAOCULAR LENS  IMPLANT, BILATERAL Bilateral 1999    Social History:  reports that she has never smoked. She has never used smokeless tobacco. She reports that she does not drink alcohol or use drugs.  Allergies  Allergen Reactions  . Lopid [Gemfibrozil] Other (See Comments)    unk  . Sulfa Antibiotics Other (See Comments)  unk  . Zocor [Simvastatin] Other (See Comments)    unk    Family History  Problem Relation Age of Onset  . Heart disease Father        MI     Prior to Admission medications   Medication Sig Start Date End Date Taking? Authorizing Provider   calcium carbonate (TUMS) 500 MG chewable tablet Chew 3 tablets by mouth daily.    Yes [provider]  Cholecalciferol (VITAMIN D) 1000 UNITS capsule Take 2,000 Units by mouth daily.    Yes [provider]  donepezil (ARICEPT) 10 MG tablet Take 10 mg by mouth at bedtime. To help preserve memory. 10/26/12  Yes Estill Dooms, MD    Physical Exam: Vitals:   09/30/2018 1615 10/07/2018 1700 10/15/2018 1815 10/03/2018 1845  BP: 115/76 124/77 115/90 116/72  Pulse: (!) 104 (!) 104 (!) 107 (!) 107  Resp: (!) 34 (!) 32 (!) 33 (!) 31  Temp:      TempSrc:      SpO2: 100% 100% 100% 100%    Constitutional: Elderly woman resting supine in bed, rarely opens eyes, not following commands, appears comfortable Eyes: lids and conjunctivae normal ENMT: Mucous membranes are dry.  Neck: normal, supple, no masses. Respiratory: clear to auscultation anteriorly, tachypneic with increased work of breathing Cardiovascular: Tachycardic, soft systolic murmur. No extremity edema.  Abdomen: Mild abdominal tenderness on palpation as expressed by wincing, no masses palpated. No hepatosplenomegaly. Musculoskeletal: no clubbing / cyanosis. No joint deformity upper and lower extremities.  Not moving extremities to command, withdraws to painful stimuli. Skin: no rashes, lesions, ulcers.  Neurologic: Exam limited, somnolent, not following commands, withdraws extremities to painful stimuli Psychiatric: Somnolent    Labs on Admission: I have personally reviewed following labs and imaging studies  CBC: Recent Labs  Lab 10/01/2018 1617 10/13/2018 1708  WBC  --  16.1*  NEUTROABS  --  14.1*  HGB 18.0* 18.7*  HCT 53.0* 60.9*  MCV  --  102.9*  PLT  --  94*   Basic Metabolic Panel: Recent Labs  Lab 09/26/2018 1617 09/21/2018 1708  NA 162* 160*  K 3.8 4.0  CL  --  124*  CO2  --  21*  GLUCOSE  --  142*  BUN  --  97*  CREATININE  --  4.67*  CALCIUM  --  9.5   GFR: Estimated Creatinine Clearance: 6.3  mL/min (A) (by C-G formula based on SCr of 4.67 mg/dL (H)). Liver Function Tests: Recent Labs  Lab 10/13/2018 1708  AST 55*  ALT 69*  ALKPHOS 123  BILITOT 1.9*  PROT 6.7  ALBUMIN 3.6   No results for input(s): LIPASE, AMYLASE in the last 168 hours. Recent Labs  Lab 10/13/2018 1708  AMMONIA 27   Coagulation Profile: Recent Labs  Lab 10/08/2018 1708  INR 1.3*   Cardiac Enzymes: No results for input(s): CKTOTAL, CKMB, CKMBINDEX, TROPONINI in the last 168 hours. BNP (last 3 results) No results for input(s): PROBNP in the last 8760 hours. HbA1C: No results for input(s): HGBA1C in the last 72 hours. CBG: Recent Labs  Lab 09/21/2018 1617  GLUCAP 141*   Lipid Profile: No results for input(s): CHOL, HDL, LDLCALC, TRIG, CHOLHDL, LDLDIRECT in the last 72 hours. Thyroid Function Tests: No results for input(s): TSH, T4TOTAL, FREET4, T3FREE, THYROIDAB in the last 72 hours. Anemia Panel: No results for input(s): VITAMINB12, FOLATE, FERRITIN, TIBC, IRON, RETICCTPCT in the last 72 hours. Urine analysis:    Component Value  Date/Time   COLORURINE AMBER BIOCHEMICALS MAY BE AFFECTED BY COLOR (A) 08/05/2010 1739   APPEARANCEUR TURBID (A) 08/05/2010 1739   LABSPEC 1.021 08/05/2010 1739   PHURINE 7.5 08/05/2010 1739   GLUCOSEU NEGATIVE 08/05/2010 1739   HGBUR NEGATIVE 08/05/2010 1739   BILIRUBINUR SMALL (A) 08/05/2010 1739   KETONESUR 15 (A) 08/05/2010 1739   PROTEINUR 30 (A) 08/05/2010 1739   UROBILINOGEN 0.2 08/05/2010 1739   NITRITE NEGATIVE 08/05/2010 1739   LEUKOCYTESUR LARGE (A) 08/05/2010 1739    Radiological Exams on Admission: Ct Head Wo Contrast  Result Date: 09/30/2018 CLINICAL DATA:  Altered level of consciousness, history hypertension EXAM: CT HEAD WITHOUT CONTRAST TECHNIQUE: Contiguous axial images were obtained from the base of the skull through the vertex without intravenous contrast. Sagittal and coronal MPR images reconstructed from axial data set. COMPARISON:  None  FINDINGS: Brain: Generalized atrophy. Normal ventricular morphology. No midline shift or mass effect. Small vessel chronic ischemic changes of deep cerebral white matter. Probable small old infarct at inferior LEFT cerebellum. No intracranial hemorrhage, mass lesion, evidence of acute infarction, or extra-axial fluid collection. Vascular: Atherosclerotic calcification of internal carotid and vertebral arteries at skull base Skull: Demineralized but intact Sinuses/Orbits: Clear Other: N/A IMPRESSION: Atrophy with small vessel chronic ischemic changes of deep cerebral white matter. Probable small old infarct at inferior LEFT cerebellum. No acute intracranial abnormalities. Electronically Signed   By: Lavonia Dana M.D.   On: 09/16/2018 17:21    EKG: Independently reviewed. Sinus tachycardia, rate 108, early R wave progression, QTC 521.  Assessment/Plan Principal Problem:   Admission for end of life care Active Problems:   Hypernatremia  [name] is a 85 y.o. female with medical history significant for advanced dementia and osteoarthritis who is admitted from a nursing facility with significant functional decline for end-of-life care/comfort measures.   End-of-life/comfort care: Family agreeable to comfort measures only as it appears patient is approaching end-of-life and do not want to pursue further life prolonging measures as it is unlikely to provide meaningful change in patient's quality of life. -Comfort care orders placed with as needed IV morphine, can transition to IV morphine infusion if needed -Continue as needed medications for anxiety, agitation/delirium, hiccoughs, nausea/vomiting -DC telemetry, hold further lab draws -Palliative care to see in a.m.  Acute renal failure/hypernatremia/dehydration: Secondary to significantly decreased oral intake.  Comfort care measures only as above.  DVT prophylaxis: None, patient's comfort care Code Status: DNR/DNI, comfort care Family  Communication: Spoke with patient's daughter at bedside Disposition Plan: Anticipate in hospital death versus transfer to inpatient/outpatient hospice Consults called: Palliative care consulted by EDP, to see in a.m. Admission status: Observation   Zada Finders MD [facility]  If 7PM-7AM, please contact night-coverage [email or web address]  09/28/2018, 8:23 PM

## 2018-09-25 NOTE — Progress Notes (Signed)
Critical ABG values given to A. Kathrynn Humble, MD.

## 2018-09-25 NOTE — ED Triage Notes (Signed)
Pt BIB [facility] from [facility], LSN yesterday. EMS reports staff found pt unresponsive this morning, at baseline, pt "talks some" and needs help with ADLs. Hx dementia. On arrival to ED, pt responsive to voice and pain, grip strength equal and strong.

## 2018-09-25 NOTE — Assessment & Plan Note (Signed)
HPOA agreed to ED eval and treat.

## 2018-09-25 NOTE — ED Notes (Signed)
ED TO INPATIENT HANDOFF REPORT  ED Nurse Name and Phone #: [phone]  S Name/Age/Gender [name] 85 y.o. female Room/Bed: 032C/032C  Code Status   Code Status: DNR  Home/SNF/Other Dc snf [facility] Mostly unresponsive Is this baseline?Yes  Triage Complete: Triage complete  Chief Complaint Unresponsive  Triage Note Pt BIB [facility] from [facility], LSN yesterday. EMS reports staff found pt unresponsive this morning, at baseline, pt "talks some" and needs help with ADLs. Hx dementia. On arrival to ED, pt responsive to voice and pain, grip strength equal and strong.    Allergies Allergies  Allergen Reactions  . Lopid [Gemfibrozil] Other (See Comments)    unk  . Sulfa Antibiotics Other (See Comments)    unk  . Zocor [Simvastatin] Other (See Comments)    unk    Level of Care/Admitting Diagnosis ED Disposition    ED Disposition Condition Comment   Admit  [facility] [[id number]]  Level of Care: Med-Surg [16]  I expect the patient will be discharged within 24 hours: No (not a candidate for 5C-Observation unit)  Covid Evaluation: Confirmed COVID Negative  Diagnosis: Hypernatremia [[id number]][[id number]]  Admitting Physician: Charlsie QuestPATEL, VISHAL R [[id number]]  Attending Physician: Charlsie QuestPATEL, VISHAL R [[id number]]  Bed request comments: Palliative/comfort care bed  PT Class (Do Not Modify): Observation [104]  PT Acc Code (Do Not Modify): Observation [10022]       B Medical/Surgery History Past Medical History:  Diagnosis Date  . Abnormal mammogram, unspecified 10/25/2006  . Abnormality of gait 03/02/2011  . Allergic rhinitis due to pollen 07/13/2006  . Blood in stool 06/29/2011  . Candidiasis 07/01/2012  . Cholelithiasis 08/02/2010  . Disturbance of skin sensation 11/25/2009  . Diverticulitis of colon (without mention of hemorrhage)(562.11) 10/28/2003  . Dizziness and giddiness 07/13/2006  . Edema 07/19/2006  . Lumbago 12/22/2009  . Memory loss 02/17/2010  .  Osteoarthrosis, unspecified whether generalized or localized, unspecified site 07/19/2006  . Other and unspecified hyperlipidemia 03/28/2007  . Pain in joint, upper arm 10/25/2006  . Personal history of fall 12/22/2009  . Unspecified constipation 09/26/2007  . Unspecified essential hypertension 07/19/2006  . Unspecified vitamin D deficiency 08/19/2009  . Uterine prolapse without mention of vaginal wall prolapse 06/29/2011   Past Surgical History:  Procedure Laterality Date  . CATARACT EXTRACTION W/ INTRAOCULAR LENS  IMPLANT, BILATERAL Bilateral 1999     A IV Location/Drains/Wounds Patient Lines/Drains/Airways Status   Active Line/Drains/Airways    None          Intake/Output Last 24 hours No intake or output data in the 24 hours ending 09/23/2018 2020  Labs/Imaging Results for orders placed or performed during the hospital encounter of 10/06/2018 (from the past 48 hour(s))  CBG monitoring, ED     Status: Abnormal   Collection Time: 09/26/2018  4:17 PM  Result Value Ref Range   Glucose-Capillary 141 (H) 70 - 99 mg/dL  I-STAT 7, (LYTES, BLD GAS, ICA, H+H)     Status: Abnormal   Collection Time: 10/12/2018  4:17 PM  Result Value Ref Range   pH, Arterial 7.414 7.350 - 7.450   pCO2 arterial 34.2 32.0 - 48.0 mmHg   pO2, Arterial 81.0 (L) 83.0 - 108.0 mmHg   Bicarbonate 21.8 20.0 - 28.0 mmol/L   TCO2 23 22 - 32 mmol/L   O2 Saturation 96.0 %   Acid-base deficit 2.0 0.0 - 2.0 mmol/L   Sodium 162 (HH) 135 - 145 mmol/L   Potassium 3.8  3.5 - 5.1 mmol/L   Calcium, Ion 1.30 1.15 - 1.40 mmol/L   HCT 53.0 (H) 36.0 - 46.0 %   Hemoglobin 18.0 (H) 12.0 - 15.0 g/dL   Patient temperature 99.5 F    Collection site RADIAL, ALLEN'S TEST ACCEPTABLE    Drawn by RT    Sample type ARTERIAL    Comment NOTIFIED PHYSICIAN   Comprehensive metabolic panel     Status: Abnormal   Collection Time: 10/11/2018  5:08 PM  Result Value Ref Range   Sodium 160 (H) 135 - 145 mmol/L   Potassium 4.0 3.5 - 5.1 mmol/L    Chloride 124 (H) 98 - 111 mmol/L   CO2 21 (L) 22 - 32 mmol/L   Glucose, Bld 142 (H) 70 - 99 mg/dL   BUN 97 (H) 8 - 23 mg/dL   Creatinine, Ser 4.67 (H) 0.44 - 1.00 mg/dL   Calcium 9.5 8.9 - 10.3 mg/dL   Total Protein 6.7 6.5 - 8.1 g/dL   Albumin 3.6 3.5 - 5.0 g/dL   AST 55 (H) 15 - 41 U/L   ALT 69 (H) 0 - 44 U/L   Alkaline Phosphatase 123 38 - 126 U/L   Total Bilirubin 1.9 (H) 0.3 - 1.2 mg/dL   GFR calc non Af Amer 7 (L) >60 mL/min   GFR calc Af Amer 8 (L) >60 mL/min   Anion gap 15 5 - 15    Comment: Performed at [facility], [address], [place], [place] [address]  CBC WITH DIFFERENTIAL     Status: Abnormal   Collection Time: 10/03/2018  5:08 PM  Result Value Ref Range   WBC 16.1 (H) 4.0 - 10.5 K/uL   RBC 5.92 (H) 3.87 - 5.11 MIL/uL   Hemoglobin 18.7 (H) 12.0 - 15.0 g/dL   HCT 60.9 (H) 36.0 - 46.0 %   MCV 102.9 (H) 80.0 - 100.0 fL   MCH 31.6 26.0 - 34.0 pg   MCHC 30.7 30.0 - 36.0 g/dL   RDW 14.6 11.5 - 15.5 %   Platelets 94 (L) 150 - 400 K/uL    Comment: REPEATED TO VERIFY PLATELET COUNT CONFIRMED BY SMEAR Immature Platelet Fraction may be clinically indicated, consider ordering this additional test [id number]    nRBC 0.6 (H) 0.0 - 0.2 %   Neutrophils Relative % 87 %   Neutro Abs 14.1 (H) 1.7 - 7.7 K/uL   Lymphocytes Relative 5 %   Lymphs Abs 0.8 0.7 - 4.0 K/uL   Monocytes Relative 7 %   Monocytes Absolute 1.1 (H) 0.1 - 1.0 K/uL   Eosinophils Relative 0 %   Eosinophils Absolute 0.0 0.0 - 0.5 K/uL   Basophils Relative 0 %   Basophils Absolute 0.0 0.0 - 0.1 K/uL   Immature Granulocytes 1 %   Abs Immature Granulocytes 0.17 (H) 0.00 - 0.07 K/uL    Comment: Performed at [facility] [address] [place], [place] [address]  Ammonia     Status: None   Collection Time: 10/13/2018  5:08 PM  Result Value Ref Range   Ammonia 27 9 - 35 umol/L    Comment: Performed at [facility], [place] [address] [place], [place] [address]  Protime-INR     Status:  Abnormal   Collection Time: 09/17/2018  5:08 PM  Result Value Ref Range   Prothrombin Time 15.7 (H) 11.4 - 15.2 seconds   INR 1.3 (H) 0.8 - 1.2    Comment: (NOTE)  INR goal varies based on device and disease states. Performed at [facility], [address], [place], [place] [address]   APTT     Status: None   Collection Time: 10/02/2018  5:08 PM  Result Value Ref Range   aPTT 25 24 - 36 seconds    Comment: Performed at [facility], [address], [place], [place] [address]  SARS Coronavirus 2     Status: None   Collection Time: 09/21/2018  5:13 PM  Result Value Ref Range   SARS Coronavirus 2 NOT DETECTED NOT DETECTED    Comment: (NOTE) SARS-CoV-2 target nucleic acids are NOT DETECTED. The SARS-CoV-2 RNA is generally detectable in upper and lower respiratory specimens during the acute phase of infection.  Negative  results do not preclude SARS-CoV-2 infection, do not rule out co-infections with other pathogens, and should not be used as the sole basis for treatment or other patient management decisions.  Negative results must be combined with clinical observations, patient history, and epidemiological information. The expected result is Not Detected. Fact Sheet for Patients: http://www.biofiredefense.com/wp-content/uploads/2020/03/BIOFIRE-COVID -19-patients.pdf Fact Sheet for Healthcare Providers: http://www.biofiredefense.com/wp-content/uploads/2020/03/BIOFIRE-COVID -19-hcp.pdf This test is not yet approved or cleared by the [place] and  has been authorized for detection and/or diagnosis of SARS-CoV-2 by FDA under an Emergency Use Authorization (EUA).  This EUA will remain in effec t (meaning this test can be used) for the duration of  the COVID-19 declaration under Section 564(b)(1) of the Act, 21 U.S.C. section 360bbb-3(b)(1), unless the authorization is terminated or revoked sooner. Performed at [facility], [address], [place],  [place] [address]    Ct Head Wo Contrast  Result Date: 10/11/2018 CLINICAL DATA:  Altered level of consciousness, history hypertension EXAM: CT HEAD WITHOUT CONTRAST TECHNIQUE: Contiguous axial images were obtained from the base of the skull through the vertex without intravenous contrast. Sagittal and coronal MPR images reconstructed from axial data set. COMPARISON:  None FINDINGS: Brain: Generalized atrophy. Normal ventricular morphology. No midline shift or mass effect. Small vessel chronic ischemic changes of deep cerebral white matter. Probable small old infarct at inferior LEFT cerebellum. No intracranial hemorrhage, mass lesion, evidence of acute infarction, or extra-axial fluid collection. Vascular: Atherosclerotic calcification of internal carotid and vertebral arteries at skull base Skull: Demineralized but intact Sinuses/Orbits: Clear Other: N/A IMPRESSION: Atrophy with small vessel chronic ischemic changes of deep cerebral white matter. Probable small old infarct at inferior LEFT cerebellum. No acute intracranial abnormalities. Electronically Signed   By: Ulyses Southward M.D.   On: 09/29/2018 17:21    Pending Labs Unresulted Labs (From admission, onward)    Start     Ordered   09/21/2018 1613  Blood culture (routine x 2)  BLOOD CULTURE X 2,   STAT,   Status:  Canceled     10/11/2018 1612          Vitals/Pain Today's Vitals   09/29/2018 1615 10/11/2018 1700 09/18/2018 1815 09/17/2018 1845  BP: 115/76 124/77 115/90 116/72  Pulse: (!) 104 (!) 104 (!) 107 (!) 107  Resp: (!) 34 (!) 32 (!) 33 (!) 31  Temp:      TempSrc:      SpO2: 100% 100% 100% 100%    Isolation Precautions No active isolations  Medications Medications  acetaminophen (TYLENOL) tablet 650 mg (has no administration in time range)    Or  acetaminophen (TYLENOL) suppository 650 mg (has no administration in time range)  haloperidol (HALDOL) tablet 0.5  mg (has no administration in time range)    Or  haloperidol (HALDOL) 2 MG/ML  solution 0.5 mg (has no administration in time range)    Or  haloperidol lactate (HALDOL) injection 0.5 mg (has no administration in time range)  ondansetron (ZOFRAN-ODT) disintegrating tablet 4 mg (has no administration in time range)    Or  ondansetron (ZOFRAN) injection 4 mg (has no administration in time range)  glycopyrrolate (ROBINUL) tablet 1 mg (has no administration in time range)    Or  glycopyrrolate (ROBINUL) injection 0.2 mg (has no administration in time range)    Or  glycopyrrolate (ROBINUL) injection 0.2 mg (has no administration in time range)  antiseptic oral rinse (BIOTENE) solution 15 mL (has no administration in time range)  polyvinyl alcohol (LIQUIFILM TEARS) 1.4 % ophthalmic solution 1 drop (has no administration in time range)  magic mouthwash w/lidocaine (has no administration in time range)  morphine CONCENTRATE 10 MG/0.5ML oral solution 5 mg (has no administration in time range)    Or  morphine CONCENTRATE 10 MG/0.5ML oral solution 5 mg (has no administration in time range)  morphine 2 MG/ML injection 1 mg (has no administration in time range)  temazepam (RESTORIL) capsule 7.5 mg (has no administration in time range)  LORazepam (ATIVAN) tablet 1 mg (has no administration in time range)    Or  LORazepam (ATIVAN) 2 MG/ML concentrated solution 1 mg (has no administration in time range)    Or  LORazepam (ATIVAN) injection 1 mg (has no administration in time range)  diphenhydrAMINE (BENADRYL) injection 12.5 mg (has no administration in time range)  senna (SENOKOT) tablet 8.6 mg (has no administration in time range)  chlorproMAZINE (THORAZINE) 12.5 mg in sodium chloride 0.9 % 25 mL IVPB (has no administration in time range)  nystatin (MYCOSTATIN/NYSTOP) topical powder (has no administration in time range)    Mobility Complete care High fall risk   Focused Assessments Pt made comfort care by provider responding only to voice and pain, family at the bedside, no  skin issue.  R Recommendations: See Admitting Provider Note  Report given to:   Additional Notes:

## 2018-09-25 NOTE — ED Notes (Signed)
Per day shift nurse provider no need to do I and out, pt is confort care only now.

## 2018-09-25 NOTE — Assessment & Plan Note (Signed)
Resides in SNF [facility] for care, on Donepezil for memory.

## 2018-09-25 NOTE — Assessment & Plan Note (Signed)
Stable, last BMP 09/24/18

## 2018-09-25 NOTE — ED Notes (Signed)
Patient transported to CT 

## 2018-09-26 DIAGNOSIS — I129 Hypertensive chronic kidney disease with stage 1 through stage 4 chronic kidney disease, or unspecified chronic kidney disease: Secondary | ICD-10-CM | POA: Diagnosis present

## 2018-09-26 DIAGNOSIS — R627 Adult failure to thrive: Secondary | ICD-10-CM | POA: Diagnosis present

## 2018-09-26 DIAGNOSIS — R4182 Altered mental status, unspecified: Secondary | ICD-10-CM | POA: Diagnosis present

## 2018-09-26 DIAGNOSIS — E785 Hyperlipidemia, unspecified: Secondary | ICD-10-CM | POA: Diagnosis present

## 2018-09-26 DIAGNOSIS — Z515 Encounter for palliative care: Secondary | ICD-10-CM | POA: Diagnosis present

## 2018-09-26 DIAGNOSIS — N179 Acute kidney failure, unspecified: Secondary | ICD-10-CM

## 2018-09-26 DIAGNOSIS — N189 Chronic kidney disease, unspecified: Secondary | ICD-10-CM | POA: Diagnosis present

## 2018-09-26 DIAGNOSIS — K219 Gastro-esophageal reflux disease without esophagitis: Secondary | ICD-10-CM | POA: Diagnosis present

## 2018-09-26 DIAGNOSIS — Z20828 Contact with and (suspected) exposure to other viral communicable diseases: Secondary | ICD-10-CM | POA: Diagnosis present

## 2018-09-26 DIAGNOSIS — R402342 Coma scale, best motor response, flexion withdrawal, at arrival to emergency department: Secondary | ICD-10-CM | POA: Diagnosis present

## 2018-09-26 DIAGNOSIS — R402112 Coma scale, eyes open, never, at arrival to emergency department: Secondary | ICD-10-CM | POA: Diagnosis present

## 2018-09-26 DIAGNOSIS — F0390 Unspecified dementia without behavioral disturbance: Secondary | ICD-10-CM | POA: Diagnosis present

## 2018-09-26 DIAGNOSIS — Z66 Do not resuscitate: Secondary | ICD-10-CM | POA: Diagnosis present

## 2018-09-26 DIAGNOSIS — E860 Dehydration: Secondary | ICD-10-CM | POA: Diagnosis present

## 2018-09-26 DIAGNOSIS — E870 Hyperosmolality and hypernatremia: Secondary | ICD-10-CM

## 2018-09-26 DIAGNOSIS — Z8249 Family history of ischemic heart disease and other diseases of the circulatory system: Secondary | ICD-10-CM | POA: Diagnosis not present

## 2018-09-26 DIAGNOSIS — Z882 Allergy status to sulfonamides status: Secondary | ICD-10-CM | POA: Diagnosis not present

## 2018-09-26 DIAGNOSIS — M1990 Unspecified osteoarthritis, unspecified site: Secondary | ICD-10-CM | POA: Diagnosis present

## 2018-09-26 DIAGNOSIS — R402212 Coma scale, best verbal response, none, at arrival to emergency department: Secondary | ICD-10-CM | POA: Diagnosis present

## 2018-09-26 DIAGNOSIS — Z888 Allergy status to other drugs, medicaments and biological substances status: Secondary | ICD-10-CM | POA: Diagnosis not present

## 2018-09-26 MED ORDER — HYDROMORPHONE HCL 1 MG/ML IJ SOLN
0.2500 mg | INTRAMUSCULAR | Status: DC | PRN
  Administered 2018-09-26: 10:00:00 0.25 mg via INTRAVENOUS

## 2018-09-26 MED ORDER — SODIUM CHLORIDE 0.9 % IV SOLN
0.2500 mg/h | INTRAVENOUS | Status: DC
  Administered 2018-09-26: 0.25 mg/h via INTRAVENOUS
  Filled 2018-09-26: qty 5

## 2018-09-26 MED ORDER — HYDROMORPHONE BOLUS VIA INFUSION: 0.2000 mg | INTRAVENOUS | Status: DC | PRN

## 2018-09-26 MED ORDER — HYDROMORPHONE HCL 1 MG/ML IJ SOLN
INTRAMUSCULAR | Status: AC
  Filled 2018-09-26: qty 1

## 2018-09-26 MED ORDER — HYDROMORPHONE BOLUS VIA INFUSION
0.2000 mg | INTRAVENOUS | Status: DC | PRN
  Filled 2018-09-26: qty 1

## 2018-09-26 MED ORDER — MORPHINE SULFATE (PF) 2 MG/ML IV SOLN: 2.0000 mg | INTRAVENOUS | Status: DC | PRN

## 2018-09-26 NOTE — Progress Notes (Signed)
Palliative Medicine RN Note: Afternoon symptom check. PAINAD 0. Hands are relaxed, no tension. Breath is very, very foul despite recent mouth care by floor staff.  Pt's son and daughter are at bedside. We discussed s/s imminent demise including cooling, potential color change, relaxing of muscles. Focus on breathing changes, as she is having 30 second long periods of apnea during my visit. We also discussed their thoughts on both staying with her overnight and coming back to visit if she dies and they aren't here; no decision yet.  Updated PMT PA Haynes Dage and floor RN Ria Comment.  At this time, patient remains too unstable to consider transfer to a hospice facility. If she survives the night, we will see her again tomorrow. Per Haynes Dage, prognosis likely a few hours.  Sierra Skiff Halton Neas, RN, BSN, [facility] Palliative Medicine Team 09/26/2018 4:07 PM Office [phone]

## 2018-09-26 NOTE — Progress Notes (Signed)
PROGRESS NOTE  Sierra PeekSarah I Rowzee ZOX:[id number] DOB: 06/19/1918 DOA: 12-23-18 PCP: Mahlon GammonGupta, Anjali L, MD   LOS: 0 days   Brief narrative: [name] is a 85 y.o. female with medical history significant for advanced dementia and osteoarthritis who [facility] for progressive functional decline and unresponsiveness.  Patient reportedly was typically minimally responsive however over the last week or so, she has become significantly less responsive during family visits and not eating or drinking much.  She has had a slow but steady decline ongoing over the last 1-2 years. Patient is DNR/DNI and at this time as it appears she is approaching end-of-life, want to focus only on comfort during her remaining time. CT head without contrast showed atrophy with small vessel chronic ischemic changes of deep cerebral white matter, possible small infarct at inferior left cerebellum, without acute intracranial abnormalities. The ED physician discussed the case with patient's son who requested comfort measures only and no life prolonging measures. Palliative medicine  recommended medical admission for comfort care.  Subjective: Patient is nonverbal and barely responsive.  Nursing staff reports that she is not agitated  Assessment/Plan:  Principal Problem:   Admission for end of life care Active Problems:   Hypernatremia   Acute hypernatremia   AKI (acute kidney injury) [facility](HCC)   Palliative care encounter   Comfort measures only status  End-of-life/comfort care: Patient with underlying advanced dementia with progressive decline.  Family wishing for comfort care.  Palliative care on board.  On Dilaudid drip, Ativan as needed and comfort medications..    Acute renal failure/hypernatremia/dehydration: Secondary to significantly decreased oral intake.  Comfort care measures only as above.  VTE Prophylaxis: none  Code Status: DNR  Family  Communication: None  Disposition Plan: On comfort care,   Consultants:  Palliative care  Procedures:  None  Antibiotics: Anti-infectives (From admission, onward)   None      Hematologist Objective: Vitals:   09/26/18 0556 09/26/18 0600  BP: (!) 178/139   Pulse: 84   Resp:  18  Temp:    SpO2: (!) 79%     Intake/Output Summary (Last 24 hours) at 09/26/2018 1309 Last data filed at 09/26/2018 0651 Gross per 24 hour  Intake 0 ml  Output 25 ml  Net -25 ml   There were no vitals filed for this visit. There is no height or weight on file to calculate BMI.   Physical Exam: GENERAL: Patient grimaces to physical stimuli, not in obvious distress. HENT: No scleral pallor or icterus. Pupils equally reactive to light. Oral mucosa is moist NECK: is supple, no palpable thyroid enlargement. CHEST: Clear to auscultation. No crackles or wheezes. Non tender on palpation. Diminished breath sounds bilaterally. CVS: S1 and S2 heard, no murmur. Regular rate and rhythm. No pericardial rub. ABDOMEN: Soft, non-tender, bowel sounds are present. No palpable hepato-splenomegaly. EXTREMITIES: No edema. CNS: Does not respond to verbal command, flexed posture SKIN: warm and dry without rashes.  Data Review: I have personally reviewed the following laboratory data and studies,  CBC: Recent Labs  Lab 01-15-19 1617 01-15-19 1708  WBC  --  16.1*  NEUTROABS  --  14.1*  HGB 18.0* 18.7*  HCT 53.0* 60.9*  MCV  --  102.9*  PLT  --  94*   Basic Metabolic Panel: Recent Labs  Lab 01-15-19 1617 01-15-19 1708  NA 162* 160*  K 3.8 4.0  CL  --  124*  CO2  --  21*  GLUCOSE  --  142*  BUN  --  97*  CREATININE  --  4.67*  CALCIUM  --  9.5   Liver Function Tests: Recent Labs  Lab 10/07/2018 1708  AST 55*  ALT 69*  ALKPHOS 123  BILITOT 1.9*  PROT 6.7  ALBUMIN 3.6   No results for input(s): LIPASE, AMYLASE in the last 168 hours. Recent Labs  Lab 10/14/2018 1708  AMMONIA 27    Cardiac Enzymes: No results for input(s): CKTOTAL, CKMB, CKMBINDEX, TROPONINI in the last 168 hours. BNP (last 3 results) No results for input(s): BNP in the last 8760 hours.  ProBNP (last 3 results) No results for input(s): PROBNP in the last 8760 hours.  CBG: Recent Labs  Lab 09/26/2018 1617  GLUCAP 141*   Recent Results (from the past 240 hour(s))  Blood culture (routine x 2)     Status: None (Preliminary result)   Collection Time: 09/20/2018  5:09 PM   Specimen: BLOOD  Result Value Ref Range Status   Specimen Description BLOOD LEFT ANTECUBITAL  Final   Special Requests   Final    BOTTLES DRAWN AEROBIC AND ANAEROBIC Blood Culture adequate volume   Culture   Final    NO GROWTH < 24 HOURS Performed at [facility], [address] [place], [place] [address]    Report Status PENDING  Incomplete  SARS Coronavirus 2     Status: None   Collection Time: 10/03/2018  5:13 PM  Result Value Ref Range Status   SARS Coronavirus 2 NOT DETECTED NOT DETECTED Final    Comment: (NOTE) SARS-CoV-2 target nucleic acids are NOT DETECTED. The SARS-CoV-2 RNA is generally detectable in upper and lower respiratory specimens during the acute phase of infection.  Negative  results do not preclude SARS-CoV-2 infection, do not rule out co-infections with other pathogens, and should not be used as the sole basis for treatment or other patient management decisions.  Negative results must be combined with clinical observations, patient history, and epidemiological information. The expected result is Not Detected. Fact Sheet for Patients: http://www.biofiredefense.com/wp-content/uploads/2020/03/BIOFIRE-COVID -19-patients.pdf Fact Sheet for Healthcare Providers: http://www.biofiredefense.com/wp-content/uploads/2020/03/BIOFIRE-COVID -19-hcp.pdf This test is not yet approved or cleared by the [place] FDA and  has been authorized for detection and/or diagnosis of SARS-CoV-2 by FDA under an  Emergency Use Authorization (EUA).  This EUA will remain in effec t (meaning this test can be used) for the duration of  the COVID-19 declaration under Section 564(b)(1) of the Act, 21 U.S.C. section 360bbb-3(b)(1), unless the authorization is terminated or revoked sooner. Performed at [facility], [address] [place], [place] [address]      Studies: Ct Head Wo Contrast  Result Date: 09/26/2018 CLINICAL DATA:  Altered level of consciousness, history hypertension EXAM: CT HEAD WITHOUT CONTRAST TECHNIQUE: Contiguous axial images were obtained from the base of the skull through the vertex without intravenous contrast. Sagittal and coronal MPR images reconstructed from axial data set. COMPARISON:  None FINDINGS: Brain: Generalized atrophy. Normal ventricular morphology. No midline shift or mass effect. Small vessel chronic ischemic changes of deep cerebral white matter. Probable small old infarct at inferior LEFT cerebellum. No intracranial hemorrhage, mass lesion, evidence of acute infarction, or extra-axial fluid collection. Vascular: Atherosclerotic calcification of internal carotid and vertebral arteries at skull base Skull: Demineralized but intact Sinuses/Orbits: Clear Other: N/A IMPRESSION: Atrophy with small vessel chronic ischemic changes of deep cerebral white matter. Probable small old infarct at inferior LEFT cerebellum. No acute  intracranial abnormalities. Electronically Signed   By: Lavonia Dana M.D.   On: 04-Oct-2018 17:21    Scheduled Meds: . HYDROmorphone        Continuous Infusions: . chlorproMAZINE (THORAZINE) IV    . HYDROmorphone 0.25 mg/hr (09/26/18 1119)     Flora Lipps, MD  [facility] 09/26/2018

## 2018-09-26 NOTE — Progress Notes (Signed)
Nutrition Brief Note  Chart reviewed. Pt now transitioning to comfort care.  No further nutrition interventions warranted at this time.  Please re-consult as needed.   Tyneisha Hegeman A. Annalyssa Thune, RD, LDN, CDCES Registered Dietitian II Certified Diabetes Care and Education Specialist Pager: [phone] After hours Pager: [phone]  

## 2018-09-26 NOTE — Consult Note (Signed)
Consultation Note Date: 09/26/2018   Patient Name: [name]  DOB: 11/12/1918  MRN: [id number]  Age / Sex: 85 y.o., female  PCP: Virgie Dad, MD Referring Physician: Flora Lipps, MD  Reason for Consultation: Hospice Evaluation, Non pain symptom management, Pain control and Psychosocial/spiritual support  HPI/Patient Profile: 85 y.o. female  with past medical history of advanced dementia, osteoarthritis and falls who was admitted on 10/07/2018 with a change in mental status and failure to thrive.  In the ER the physicians spoke with the family and explained that the patient is in renal failure, has severe hypernatremia, and is extremely dehydrated.  In short she is dying from advanced dementia.  Clinical Assessment and Goals of Care:  I have reviewed medical records including EPIC notes, labs and imaging, assessed the patient and then met at the bedside along with her son Ron and his wife  to discuss diagnosis prognosis, GOC, EOL wishes, disposition and options.  I introduced Palliative Medicine as specialized medical care for people living with serious illness. It focuses on providing relief from the symptoms and stress of a serious illness.   As far as functional and nutritional status Ron states the patient has not been eating or drinking much at all for the past two weeks.  Last Sunday 6/7 his mother could not stay awake for longer than 30 seconds at a time.    We discussed her current illness and what it means in the larger context of her on-going co-morbidities.  Natural disease trajectory and expectations at EOL were discussed.  Hospice House services were considered and discussed.  At this point the patient is no longer making urine. She is in the fetal position and guarding her stomach.  After discussion with the family we will search for easily correctable causes of pain and provide medications  to relieve pain.  At this point the patients symptoms are not well managed, prognosis is very short and she is not appropriate for transfer outside of the hospital.  Questions and concerns were addressed.     Primary Decision Maker:  NEXT OF KIN Son Chriss Czar is HCPOA.    SUMMARY OF RECOMMENDATIONS    Will d/c morphine due to renal failure.  Start dilaudid drip low dose with PRN boluses.  Patient is not speaking and has to be closely evaluated for pain.  Typically she grips her fists and grimaces when she is in pain. Son support a pain medication drip.  His mother's comfort is his primary concern. Inappropriate for transfer at this time as I believe her prognosis is less than 48 hours and likely less than 24.  Will request bladder scan to rule out urinary retention Compassion cart requested.  Greatly appreciate 6N RN staff!  Code Status/Advance Care Planning:  DNR   Symptom Management:   Dilaudid gtt with PRN boluses.   Palliative Prophylaxis:   Frequent Pain Assessment  Psycho-social/Spiritual:   Desire for further Chaplaincy support:requested.  Patient was Methodist but converted to Catholicism when the children were  older.  Prognosis:  Hours to days.    Discharge Planning: Anticipated Hospital Death      Primary Diagnoses: Present on Admission: . Hypernatremia   I have reviewed the medical record, interviewed the patient and family, and examined the patient. The following aspects are pertinent.  Past Medical History:  Diagnosis Date  . Abnormal mammogram, unspecified 10/25/2006  . Abnormality of gait 03/02/2011  . Allergic rhinitis due to pollen 07/13/2006  . Blood in stool 06/29/2011  . Candidiasis 07/01/2012  . Cholelithiasis 08/02/2010  . Disturbance of skin sensation 11/25/2009  . Diverticulitis of colon (without mention of hemorrhage)(562.11) 10/28/2003  . Dizziness and giddiness 07/13/2006  . Edema 07/19/2006  . Lumbago 12/22/2009  . Memory loss 02/17/2010  .  Osteoarthrosis, unspecified whether generalized or localized, unspecified site 07/19/2006  . Other and unspecified hyperlipidemia 03/28/2007  . Pain in joint, upper arm 10/25/2006  . Personal history of fall 12/22/2009  . Unspecified constipation 09/26/2007  . Unspecified essential hypertension 07/19/2006  . Unspecified vitamin D deficiency 08/19/2009  . Uterine prolapse without mention of vaginal wall prolapse 06/29/2011   Social History   Socioeconomic History  . Marital status: Married    Spouse name: Not on file  . Number of children: Not on file  . Years of education: Not on file  . Highest education level: Not on file  Occupational History  . Occupation: retired Marketing executive work  Scientific laboratory technician  . Financial resource strain: Not on file  . Food insecurity    Worry: Not on file    Inability: Not on file  . Transportation needs    Medical: Not on file    Non-medical: Not on file  Tobacco Use  . Smoking status: Never Smoker  . Smokeless tobacco: Never Used  Substance and Sexual Activity  . Alcohol use: No  . Drug use: No  . Sexual activity: Never  Lifestyle  . Physical activity    Days per week: Not on file    Minutes per session: Not on file  . Stress: Not on file  Relationships  . Social Herbalist on phone: Not on file    Gets together: Not on file    Attends religious service: Not on file    Active member of club or organization: Not on file    Attends meetings of clubs or organizations: Not on file    Relationship status: Not on file  Other Topics Concern  . Not on file  Social History Narrative   Lives at [facility] memory care unit since 44/17/17   DNR   Living Will, [place]   Widowed husband died 2005   Walks with walker   Never smoked   Alcohol none   Family History  Problem Relation Age of Onset  . Heart disease Father        MI   Scheduled Meds: . HYDROmorphone       Continuous Infusions: . chlorproMAZINE (THORAZINE) IV    .  HYDROmorphone     PRN Meds:.acetaminophen **OR** acetaminophen, antiseptic oral rinse, chlorproMAZINE (THORAZINE) IV, diphenhydrAMINE, glycopyrrolate **OR** glycopyrrolate **OR** glycopyrrolate, haloperidol **OR** haloperidol **OR** haloperidol lactate, HYDROmorphone, HYDROmorphone (DILAUDID) injection, LORazepam **OR** LORazepam **OR** LORazepam, magic mouthwash w/lidocaine, nystatin, ondansetron **OR** ondansetron (ZOFRAN) IV, polyvinyl alcohol, senna, temazepam Allergies  Allergen Reactions  . Lopid [Gemfibrozil] Other (See Comments)    unk  . Sulfa Antibiotics Other (See Comments)    unk  . Zocor [Simvastatin] Other (See Comments)  unk   Review of Systems patient non verbal Physical Exam  Elderly female, fetal position, non-verbal, arms are rigidly guarding her abdomen. CV difficult to hear Resp no distress regular rate Extremities slightly cool.  Lower extremities with 1+ edema and some discoloration.  Vital Signs: BP (!) 178/139   Pulse 84   Temp 97.6 F (36.4 C) (Oral)   Resp 18   SpO2 (!) 79%  Pain Scale: PAINAD     SpO2: SpO2: (!) 79 % O2 Device:SpO2: (!) 79 % O2 Flow Rate: .O2 Flow Rate (L/min): 1 L/min  IO: Intake/output summary:   Intake/Output Summary (Last 24 hours) at 09/26/2018 1034 Last data filed at 09/26/2018 0651 Gross per 24 hour  Intake 0 ml  Output 25 ml  Net -25 ml    LBM:   Baseline Weight:   Most recent weight:       Palliative Assessment/Data: 10%     Time In: 9:50 Time Out: 10:54 Time Total: 64 min. Visit consisted of counseling and education dealing with the complex and emotionally intense issues surrounding the need for palliative care and symptom management in the setting of serious and potentially life-threatening illness. Greater than 50%  of this time was spent counseling and coordinating care related to the above assessment and plan.  Signed by: Florentina Jenny, PA-C Palliative Medicine Pager: [phone]  Please  contact Palliative Medicine Team phone at [phone] for questions and concerns.  For individual provider: See Shea Evans

## 2018-09-26 NOTE — Progress Notes (Signed)
Bladder scan for 13ml. Will continue to monitor.

## 2018-09-26 NOTE — Progress Notes (Signed)
Responded to spiritual care consult. Called and talked to Nurse. Nurse stated that PT is on the drip and that as events change that she would page. Chaplain available upon request.  Chaplain Fidel Levy  [phone]

## 2018-10-01 LAB — CULTURE, BLOOD (ROUTINE X 2)
Culture: NO GROWTH
Special Requests: ADEQUATE

## 2018-10-16 NOTE — Plan of Care (Signed)
  Problem: Coping: Goal: Ability to identify and develop effective coping behavior will improve Outcome: Not Met (add Reason)   Problem: Respiratory: Goal: Verbalizations of increased ease of respirations will increase Outcome: Not Progressing   Problem: Role Relationship: Goal: Family's ability to cope with current situation will improve Outcome: Progressing

## 2018-10-16 NOTE — Progress Notes (Signed)
0130 Pt passed comfortably.Death pronounce by 2 RN's. Alger Memos, NP and son Josphine Laffey notified of the said death. CDS called and pt is not a possible donor. Son coming over to see deceased. Will do post mortem care as soon as the family leaves. Dilaudid 0.13m/ml, 58ml wasted with N [place], Rn.

## 2018-10-16 NOTE — Discharge Summary (Signed)
Death Summary  [name] YYT:[id number] DOB: 1919-01-03 DOA: 10-11-2018  PCP: Virgie Dad, MD  Admit date: 10/11/18 Date of Death: 2018-10-13  Final Diagnoses:  Principal Problem:   Admission for end of life care Active Problems:   Hypernatremia   Acute hypernatremia   AKI (acute kidney injury) [facility])   Palliative care encounter   Comfort measures only status  Hospital Course:  [name] a 85 y.o.femalewith medical history significant foradvanced dementia and osteoarthritis [facility] for progressive functional decline and unresponsiveness. Patient reportedly was typically minimally responsive however over the last week or so, she has become significantly less responsive during family visits and not eating or drinking much. She has had a slow but steady decline ongoing over the last 1-2 years. Patient is DNR/DNI and at this time as it appears she is approaching end-of-life, and family wanted to focus only on comfort care duringher remaining time. CT head without contrast showed atrophy with small vessel chronic ischemic changes of deep cerebral white matter, possible small infarct at inferior left cerebellum, without acute intracranial abnormalities.  She was initiated on comfort care.  Patient was also seen by palliative care.  Patient was pronounced dead nursing staff on Oct 13, 2018 at [place].    Time: 0130  Signed:  Rylei Codispoti  [facility] 2018/10/13, 4:22 PM

## 2018-10-16 DEATH — deceased
